# Patient Record
Sex: Female | Born: 1965 | Race: White | Hispanic: No | Marital: Single | Smoking: Current every day smoker
Health system: Southern US, Community
[De-identification: ages and names within clinical notes are randomized; demographics above are authoritative.]

## PROBLEM LIST (undated history)

## (undated) DIAGNOSIS — C801 Malignant (primary) neoplasm, unspecified: Secondary | ICD-10-CM

## (undated) DIAGNOSIS — B029 Zoster without complications: Secondary | ICD-10-CM

## (undated) DIAGNOSIS — R569 Unspecified convulsions: Secondary | ICD-10-CM

## (undated) DIAGNOSIS — G4730 Sleep apnea, unspecified: Secondary | ICD-10-CM

## (undated) DIAGNOSIS — I10 Essential (primary) hypertension: Secondary | ICD-10-CM

## (undated) DIAGNOSIS — G5600 Carpal tunnel syndrome, unspecified upper limb: Secondary | ICD-10-CM

## (undated) DIAGNOSIS — M8580 Other specified disorders of bone density and structure, unspecified site: Secondary | ICD-10-CM

## (undated) DIAGNOSIS — R011 Cardiac murmur, unspecified: Secondary | ICD-10-CM

## (undated) DIAGNOSIS — R51 Headache: Secondary | ICD-10-CM

## (undated) DIAGNOSIS — K2270 Barrett's esophagus without dysplasia: Secondary | ICD-10-CM

## (undated) DIAGNOSIS — R0789 Other chest pain: Secondary | ICD-10-CM

## (undated) DIAGNOSIS — E785 Hyperlipidemia, unspecified: Secondary | ICD-10-CM

## (undated) DIAGNOSIS — G479 Sleep disorder, unspecified: Principal | ICD-10-CM

## (undated) DIAGNOSIS — I499 Cardiac arrhythmia, unspecified: Secondary | ICD-10-CM

## (undated) DIAGNOSIS — K219 Gastro-esophageal reflux disease without esophagitis: Secondary | ICD-10-CM

## (undated) DIAGNOSIS — F419 Anxiety disorder, unspecified: Secondary | ICD-10-CM

## (undated) DIAGNOSIS — K449 Diaphragmatic hernia without obstruction or gangrene: Secondary | ICD-10-CM

## (undated) HISTORY — DX: Unspecified convulsions: R56.9

## (undated) HISTORY — PX: CYST REMOVAL TRUNK: SHX6283

## (undated) HISTORY — PX: OTHER SURGICAL HISTORY: SHX169

## (undated) HISTORY — DX: Hyperlipidemia, unspecified: E78.5

## (undated) HISTORY — DX: Barrett's esophagus without dysplasia: K22.70

## (undated) HISTORY — PX: NECK SURGERY: SHX720

## (undated) HISTORY — DX: Other chest pain: R07.89

## (undated) HISTORY — PX: BREAST BIOPSY: SHX20

## (undated) HISTORY — DX: Sleep disorder, unspecified: G47.9

## (undated) HISTORY — PX: EYE SURGERY: SHX253

## (undated) HISTORY — PX: ABDOMINAL HERNIA REPAIR: SHX539

## (undated) HISTORY — PX: CHOLECYSTECTOMY: SHX55

## (undated) HISTORY — PX: MYRINGOTOMY: SUR874

## (undated) HISTORY — PX: ADENOIDECTOMY: SUR15

## (undated) HISTORY — DX: Essential (primary) hypertension: I10

## (undated) HISTORY — DX: Headache: R51

## (undated) HISTORY — PX: TOTAL ABDOMINAL HYSTERECTOMY: SHX209

## (undated) HISTORY — DX: Diaphragmatic hernia without obstruction or gangrene: K44.9

## (undated) HISTORY — PX: SHOULDER SURGERY: SHX246

## (undated) HISTORY — PX: INNER EAR SURGERY: SHX679

## (undated) HISTORY — DX: Malignant (primary) neoplasm, unspecified: C80.1

## (undated) HISTORY — DX: Other specified disorders of bone density and structure, unspecified site: M85.80

---
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
## 2000-02-04 ENCOUNTER — Ambulatory Visit (HOSPITAL_COMMUNITY): Admission: RE | Admit: 2000-02-04 | Discharge: 2000-02-04 | Payer: Self-pay | Admitting: Otolaryngology

## 2000-02-04 ENCOUNTER — Encounter: Payer: Self-pay | Admitting: Otolaryngology

## 2000-05-15 ENCOUNTER — Ambulatory Visit (HOSPITAL_COMMUNITY): Admission: RE | Admit: 2000-05-15 | Discharge: 2000-05-15 | Payer: Self-pay | Admitting: Specialist

## 2000-05-15 ENCOUNTER — Encounter: Payer: Self-pay | Admitting: Specialist

## 2000-05-21 ENCOUNTER — Encounter: Payer: Self-pay | Admitting: Specialist

## 2000-05-21 ENCOUNTER — Ambulatory Visit (HOSPITAL_COMMUNITY): Admission: RE | Admit: 2000-05-21 | Discharge: 2000-05-21 | Payer: Self-pay | Admitting: Specialist

## 2002-01-07 ENCOUNTER — Other Ambulatory Visit: Admission: RE | Admit: 2002-01-07 | Discharge: 2002-01-07 | Payer: Self-pay | Admitting: Obstetrics and Gynecology

## 2004-11-09 ENCOUNTER — Other Ambulatory Visit: Admission: RE | Admit: 2004-11-09 | Discharge: 2004-11-09 | Payer: Self-pay | Admitting: Obstetrics and Gynecology

## 2005-11-28 ENCOUNTER — Encounter: Admission: RE | Admit: 2005-11-28 | Discharge: 2005-11-28 | Payer: Self-pay | Admitting: Obstetrics and Gynecology

## 2005-11-28 ENCOUNTER — Encounter (INDEPENDENT_AMBULATORY_CARE_PROVIDER_SITE_OTHER): Payer: Self-pay | Admitting: Specialist

## 2005-12-04 HISTORY — PX: BREAST BIOPSY: SHX20

## 2006-05-02 ENCOUNTER — Encounter: Admission: RE | Admit: 2006-05-02 | Discharge: 2006-05-02 | Payer: Self-pay | Admitting: Obstetrics and Gynecology

## 2007-05-06 ENCOUNTER — Encounter: Admission: RE | Admit: 2007-05-06 | Discharge: 2007-05-06 | Payer: Self-pay | Admitting: Obstetrics and Gynecology

## 2007-05-10 ENCOUNTER — Encounter: Admission: RE | Admit: 2007-05-10 | Discharge: 2007-05-10 | Payer: Self-pay | Admitting: Obstetrics and Gynecology

## 2007-06-20 HISTORY — PX: GALLBLADDER SURGERY: SHX652

## 2008-05-06 ENCOUNTER — Encounter: Admission: RE | Admit: 2008-05-06 | Discharge: 2008-05-06 | Payer: Self-pay | Admitting: Obstetrics and Gynecology

## 2009-05-07 ENCOUNTER — Encounter: Admission: RE | Admit: 2009-05-07 | Discharge: 2009-05-07 | Payer: Self-pay | Admitting: Obstetrics and Gynecology

## 2010-05-09 ENCOUNTER — Encounter: Admission: RE | Admit: 2010-05-09 | Discharge: 2010-05-09 | Payer: Self-pay | Admitting: Obstetrics and Gynecology

## 2010-07-10 ENCOUNTER — Encounter: Payer: Self-pay | Admitting: Obstetrics and Gynecology

## 2011-01-24 ENCOUNTER — Other Ambulatory Visit: Payer: Self-pay | Admitting: Obstetrics and Gynecology

## 2011-04-11 ENCOUNTER — Other Ambulatory Visit: Payer: Self-pay | Admitting: Obstetrics and Gynecology

## 2011-04-11 DIAGNOSIS — Z1231 Encounter for screening mammogram for malignant neoplasm of breast: Secondary | ICD-10-CM

## 2011-05-15 ENCOUNTER — Ambulatory Visit
Admission: RE | Admit: 2011-05-15 | Discharge: 2011-05-15 | Disposition: A | Discharge status: Home / Self Care | Payer: Medicare Other | Source: Ambulatory Visit | Attending: Obstetrics and Gynecology | Admitting: Obstetrics and Gynecology

## 2011-05-15 DIAGNOSIS — Z1231 Encounter for screening mammogram for malignant neoplasm of breast: Secondary | ICD-10-CM

## 2012-03-15 ENCOUNTER — Other Ambulatory Visit: Payer: Self-pay | Admitting: Obstetrics and Gynecology

## 2012-03-15 DIAGNOSIS — Z1231 Encounter for screening mammogram for malignant neoplasm of breast: Secondary | ICD-10-CM

## 2012-05-15 ENCOUNTER — Ambulatory Visit: Payer: Medicare Other

## 2012-05-15 ENCOUNTER — Ambulatory Visit
Admission: RE | Admit: 2012-05-15 | Discharge: 2012-05-15 | Disposition: A | Discharge status: Home / Self Care | Payer: Medicare Other | Source: Ambulatory Visit | Attending: Obstetrics and Gynecology | Admitting: Obstetrics and Gynecology

## 2012-05-15 DIAGNOSIS — Z1231 Encounter for screening mammogram for malignant neoplasm of breast: Secondary | ICD-10-CM

## 2012-09-05 ENCOUNTER — Telehealth: Payer: Self-pay | Admitting: *Deleted

## 2012-09-05 NOTE — Telephone Encounter (Signed)
I called the patient. The sleep study results are not available in the computer system. Dr. Frances Furbish ordered this study, and she likely will be reading the study. I will forward her a message to see if she has any information concerning this study.

## 2012-09-05 NOTE — Telephone Encounter (Signed)
Patient calling to get sleep study results.  

## 2012-09-10 ENCOUNTER — Telehealth: Payer: Self-pay | Admitting: *Deleted

## 2012-09-10 MED ORDER — SERTRALINE HCL 100 MG PO TABS: 100.0000 mg | ORAL_TABLET | Freq: Every day | ORAL | Status: DC

## 2012-09-10 NOTE — Telephone Encounter (Signed)
Patient called wanting to know what should she do about her Zoloft  because she down to one tablet and her sleep study hasn't been read.

## 2012-09-10 NOTE — Telephone Encounter (Signed)
I called patient. She is on 50 mg of Zoloft, and we will go up to the 100 mg dosing. I will call in a prescription. She has had a sleep study, but she has not yet heard the results of this study. She is to call our office again in one week if she has not gotten any results of the sleep study.

## 2012-09-11 ENCOUNTER — Telehealth: Payer: Self-pay | Admitting: Neurology

## 2012-09-11 NOTE — Telephone Encounter (Signed)
Please call pt to advise her that I read the sleep study and would like to go over the test results in detail with her. She has mild sleep apnea and we should get together to discuss treatment options. Pls set up FU appt in the next weeks for her and also route report to Dr. Anne Hahn, thx s

## 2012-09-13 ENCOUNTER — Encounter: Payer: Self-pay | Admitting: Neurology

## 2012-09-17 ENCOUNTER — Telehealth: Payer: Self-pay | Admitting: Neurology

## 2012-09-17 NOTE — Telephone Encounter (Signed)
I called the patient. The sleep study showed mild apnea changes, there may be no need for CPAP. I discussed this with the patient. The patient will be meeting with Dr. Frances Furbish in the near future.

## 2012-09-27 ENCOUNTER — Encounter: Payer: Self-pay | Admitting: Neurology

## 2012-09-27 ENCOUNTER — Ambulatory Visit (INDEPENDENT_AMBULATORY_CARE_PROVIDER_SITE_OTHER): Payer: Medicare PPO | Admitting: Neurology

## 2012-09-27 VITALS — BP 121/74 | HR 71 | Temp 98.1°F | Ht 62.0 in | Wt 143.0 lb

## 2012-09-27 DIAGNOSIS — R569 Unspecified convulsions: Secondary | ICD-10-CM | POA: Insufficient documentation

## 2012-09-27 DIAGNOSIS — G479 Sleep disorder, unspecified: Secondary | ICD-10-CM

## 2012-09-27 HISTORY — DX: Unspecified convulsions: R56.9

## 2012-09-27 HISTORY — DX: Sleep disorder, unspecified: G47.9

## 2012-09-27 NOTE — Progress Notes (Signed)
Subjective:    Patient ID: Crystal Lyons is a 47 y.o. female.  HPI  Interim history:  Crystal Lyons is a very pleasant 47 year old right-handed woman with an underlying history of seizures, hypertension, hyperlipidemia, headaches, reflux disease and chronic back pain who presents for followup consultation of her daytime somnolence and snoring. I had first met her on 08/20/2012 at which time I suggested a sleep study for further evaluation. She is followed by Dr. Anne Hahn for seizures. She continues to smoke regularly and we have talked about smoking cessation. She is accompanied by mother again today. She had a sleep study on 08/27/2012 I went over her test results with her and her mother today in detail. She had a reduced sleep efficiency at 61.9% with a long latency to sleep of 64.5 minutes. Of note, the patient was noted to drink caffeine units of upon arrival to the sleep lab and also admitted to  having had other caffeine beverages throughout the day. She had moderate sleep fragmentation. She had a markedly increased percentage of light stage sleep, near absence of deep sleep and a markedly reduced percentage of REM sleep at 4.4%. Mild intermittent snoring was noted. She had no clinically significant. Leg movements. EEG showed increased beta activity and sleep spindles. This could be a medication effect I explained. She had a total of one obstructive apnea and 22 obstructive  hypopneas for a total AHI of 6 per hour. She has no new complaints today. Her mother states that she has always been a very light sleeper. She reports no recent seizures. I explained to her that some of her sleep related issues may be a medication effect, others could be due to lifestyle or poor sleep hygiene. She has not been sick lately.    Her Past Medical History Is Significant For: Past Medical History  Diagnosis Date  . Hypertension   . Osteopenia   . Sleep disorder 09/27/2012  . Seizures 09/27/2012    Her Past  Surgical History Is Significant For: Past Surgical History  Procedure Laterality Date  . Brain surgery  01-06-85  . Shoulder surgery Bilateral 2009, 2010  . Gallbladder surgery N/A 2009  . Ears    . Neck surgery    . Eye surgery      cyst removed    Her Family History Is Significant For: Family History  Problem Relation Age of Onset  . Hypertension Father   . Cancer Father   . Cancer Mother   . Thyroid disease Mother   . Rheum arthritis Mother     Her Social History Is Significant For: History   Social History  . Marital Status: Single    Spouse Name: N/A    Number of Children: N/A  . Years of Education: N/A   Social History Main Topics  . Smoking status: Current Every Day Smoker -- 1.50 packs/day  . Smokeless tobacco: Current User  . Alcohol Use: No  . Drug Use: No  . Sexually Active: None   Other Topics Concern  . None   Social History Narrative  . None    Her Allergies Are:  Allergies  Allergen Reactions  . Adenosine Anaphylaxis    Heart block  . Aleve (Naproxen Sodium)   . Feldene (Piroxicam)   . Keflex (Cephalexin)   . Penicillins   :   Her Current Medications Are:  Outpatient Encounter Prescriptions as of 09/27/2012  Medication Sig Dispense Refill  . ALPRAZolam (XANAX) 0.5 MG tablet       .  HYDROcodone-acetaminophen (NORCO/VICODIN) 5-325 MG per tablet       . lansoprazole (PREVACID) 30 MG capsule       . losartan (COZAAR) 100 MG tablet       . NASONEX 50 MCG/ACT nasal spray       . sertraline (ZOLOFT) 100 MG tablet Take 1 tablet (100 mg total) by mouth daily.  30 tablet  3  . SPIRIVA HANDIHALER 18 MCG inhalation capsule       . VIVELLE-DOT 0.05 MG/24HR        No facility-administered encounter medications on file as of 09/27/2012.  : Review of Systems  Constitutional: Positive for chills, fatigue and unexpected weight change (gain).  HENT: Positive for trouble swallowing and tinnitus.   Cardiovascular: Positive for leg swelling.  Endocrine:  Positive for heat intolerance.       Flushing  Musculoskeletal: Positive for arthralgias.  Allergic/Immunologic: Positive for environmental allergies.  Neurological: Positive for dizziness and headaches.       Memory loss  Hematological: Bruises/bleeds easily.  Psychiatric/Behavioral: Positive for confusion and sleep disturbance (not enough sleep). The patient is nervous/anxious.     Objective:  Neurologic Exam  Physical Exam Physical Examination:   Filed Vitals:   09/27/12 0909  BP: 121/74  Pulse: 71  Temp: 98.1 F (36.7 C)    General Examination: The patient is a very pleasant 47 y.o. female in no acute distress.  HEENT exam: Normocephalic, status post brain surgery, pupils are equal, round and reactive to light. Extraocular tracking is good with normal smooth pursuit without nystagmus. Face is symmetric with normal facial animation. She has mild decrease in mobility in her neck. This unchanged and since her neck surgery. Hearing is intact. Speech is clear. Oropharynx exam reveals a Mallampati class II. She is a narrow airway with redundant soft palate but small tonsils and normal-sized tongue which protrudes centrally. Palate elevates symmetrically and tonsils are small. Mouth is significantly dry appearing. Chest is clear to auscultation without wheezing or rhonchi noted. Heart sounds are normal without murmurs, rubs or gallops noted. Abdomen is soft, nontender, with normal bowel sounds appreciated. Skin is dry appearing. No trophic changes are seen. Skin is warm and dry. No obvious rash is appreciated and no pitting edema in the distal lower extremities bilaterally. She has no obvious joint deformities. Neurologically: Mental status: The patient is awake, alert and oriented in all 4 spheres. Her memory, attention, language and knowledge are appropriate. Cranial nerves II through XII are as described above. Motor exam: Normal bulk, strength and tone is noted. She has no drift or  tremor. She has no rebound. Romberg is negative. Fine motor skills are intact. She has no truncal or gait ataxia.  reflexes are 2+ throughout. Sensory exam is intact to light touch, pinprick, vibration and temperature sense. Gait is slightly wide-based otherwise normal arm swing and cadence is noted.   Assessment and Plan:   In summary, Crystal Lyons is a 47 yo woman with a history of epilepsy who has recently been experiencing some apneic pauses while asleep. She is also suffering from daytime somnolence. I had a long chat with the patient and her mother regarding the recent sleep study findings. I explained to her that she has mild obstructive sleep apnea and I do not believe that her sleep apnea is the main reason for daytime somnolence. I think her sleep related issues are multifactorial in that she is on several sedating medications, she also has not been having a optimal  sleep hygiene and I reiterated that to her. We also again talked about smoking cessation as part of healthy lifestyle. She is advised that this time to try to lose weight and try to sleep on her sides. I did not suggest any medications or medication changes to her today but did encourage her followup routinely with our nurse practitioner for her epilepsy. Most of our visit was spent in reviewing test results, counseling and coordination of care. The patient was advised to followup with me on an as-needed basis. She and her mother were in agreement and did not have any questions prior to leaving clinic today.

## 2012-09-27 NOTE — Patient Instructions (Addendum)
You can follow up with me as needed. Please keep your appt with Dr. Anne Hahn

## 2012-11-23 DIAGNOSIS — M545 Low back pain, unspecified: Secondary | ICD-10-CM

## 2012-11-23 DIAGNOSIS — M5412 Radiculopathy, cervical region: Secondary | ICD-10-CM

## 2012-11-23 DIAGNOSIS — E7800 Pure hypercholesterolemia, unspecified: Secondary | ICD-10-CM

## 2012-11-23 HISTORY — DX: Low back pain, unspecified: M54.50

## 2012-11-23 HISTORY — DX: Pure hypercholesterolemia, unspecified: E78.00

## 2012-11-23 HISTORY — DX: Radiculopathy, cervical region: M54.12

## 2013-01-02 ENCOUNTER — Telehealth: Payer: Self-pay | Admitting: Neurology

## 2013-01-02 MED ORDER — SERTRALINE HCL 100 MG PO TABS: 100.0000 mg | ORAL_TABLET | Freq: Every day | ORAL | Status: DC

## 2013-01-02 NOTE — Telephone Encounter (Signed)
Pt called states pharmacy has been faxing over the request for sertraline (ZOLOFT) 100 MG tablet, but there has been no response from Dr. Anne Hahn per pt. Pt would like for someone to call her and let her know if she is going to get her refill. Thanks

## 2013-01-02 NOTE — Telephone Encounter (Signed)
We received the first request for this refill to day at 4:23pm, I had not gotten the opportunity to respond yet, as it has been less than 1 hour since the request was sent. I have sent the refill  I called the patient.  Advised her we have sent the Rx.  She is aware.

## 2013-02-20 ENCOUNTER — Encounter: Payer: Self-pay | Admitting: Nurse Practitioner

## 2013-02-20 ENCOUNTER — Ambulatory Visit (INDEPENDENT_AMBULATORY_CARE_PROVIDER_SITE_OTHER): Payer: Medicare PPO | Admitting: Nurse Practitioner

## 2013-02-20 VITALS — BP 128/74 | HR 67 | Ht 62.0 in | Wt 144.0 lb

## 2013-02-20 DIAGNOSIS — R569 Unspecified convulsions: Secondary | ICD-10-CM

## 2013-02-20 DIAGNOSIS — G479 Sleep disorder, unspecified: Secondary | ICD-10-CM

## 2013-02-20 MED ORDER — ALPRAZOLAM 0.5 MG PO TABS: 0.5000 mg | ORAL_TABLET | Freq: Three times a day (TID) | ORAL | Status: DC | PRN

## 2013-02-20 MED ORDER — SERTRALINE HCL 100 MG PO TABS: 100.0000 mg | ORAL_TABLET | Freq: Every day | ORAL | Status: DC

## 2013-02-20 NOTE — Progress Notes (Signed)
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 The patient underwent epilepsy surgery 1986, with partial resection of the right temporal lobe. The patient had improvement of seizures. Her usual seizures were associated with abdominal discomfort, then associated with a staring and confusional episode. The patient has not had these events since epilepsy surgery. The patient has not been on anticonvulsant medications. The patient is intolerant to Dilantin, Tegretol, phenobarbital, Mysoline, and Depakote. The patient currently has several episodes daily of periods of anxiety symptoms. The patient indicates that when she is busy, and her mind is occupied, she does not have these events. The patient also has periodic  headaches, and she takes [facility] powders for this, on average several  powders weekly . The patient indicates that the events of anxiety last a few minutes and then resolve. The patient does not lose consciousness or have confusion with this. The patient does operate a motor vehicle without problems. The patient does have chronic low back pain.  The patient most recent EEG study that showed right temporal sharp wave activity. Sleep study done in our office showed mild obstructive sleep apnea but Dr. Frances Furbish felt her sleep-related issues and daytime drowsiness are multifactorial in that she was also on several sedating medications with poor sleep hygiene.  ROS:  14 system review of symptoms is negative except for the following Chest pain, spinning sensation, feeling hot, joint pain, aching muscles, allergies, headache, anxiety, and restless legs   Medications Current Outpatient Prescriptions on File Prior to Visit  Medication Sig Dispense Refill  . ALPRAZolam (XANAX) 0.5 MG tablet       . HYDROcodone-acetaminophen (NORCO/VICODIN) 5-325 MG per tablet        . lansoprazole (PREVACID) 30 MG capsule       . losartan (COZAAR) 100 MG tablet       . NASONEX 50 MCG/ACT nasal spray       . sertraline (ZOLOFT) 100 MG tablet Take 1 tablet (100 mg total) by mouth daily.  30 tablet  3  . SPIRIVA HANDIHALER 18 MCG inhalation capsule       . VIVELLE-DOT 0.05 MG/24HR        No current facility-administered medications on file prior to visit.    Allergies  Allergies  Allergen Reactions  . Adenosine Anaphylaxis    Heart block  . Aleve [Naproxen Sodium]   . Feldene [Piroxicam]   . Keflex [Cephalexin]   . Penicillins     Physical Exam General: well developed, well nourished, seated, in no evident distress Head: head normocephalic and atraumatic. Status post brain surgery.Oropharynx benign Neck: supple with mild decreased mobility since neck surgery  Cardiovascular: regular rate and rhythm, no murmurs  Neurologic Exam Mental Status: Awake and fully alert. Oriented to place and time. Follows all commands. Speech and language normal.   Cranial Nerves: Pupils equal, briskly reactive to light. Extraocular movements full without nystagmus. Visual fields full to confrontation. Hearing intact and symmetric to finger snap. Facial sensation intact. Face, tongue, palate move normally and symmetrically. Neck flexion and extension normal.  Motor: Normal bulk and tone. Normal strength in all tested extremity muscles.No focal weakness Sensory.: intact to touch and pinprick and vibratory.  Coordination: Rapid alternating movements normal in all extremities. Finger-to-nose and heel-to-shin performed accurately bilaterally. Gait and Station: Arises from chair without difficulty. Stance is normal.  Able to heel, toe  and tandem walk without difficulty.  Reflexes: 2+ and symmetric. Toes downgoing.     ASSESSMENT: History of seizure disorder status post epilepsy surgery Anxiety disorder Excessive daytime drowsiness, and sleep study only shows mild obstructive sleep  apnea but Dr. Frances Furbish felt her sleep-related issues and daytime drowsiness are multifactorial in that she was also on several sedating medications with poor sleep hygiene.    PLAN: Patient will continue her Zoloft and Xanax as prescribed by Dr. Anne Hahn. She will followup in 6-8 months Crystal Lyons, [facility] APRN

## 2013-02-20 NOTE — Patient Instructions (Addendum)
Patient to continue Zoloft and alprazolam as ordered Followup in 6-8 months

## 2013-02-20 NOTE — Progress Notes (Signed)
I have read the note, and I agree with the clinical assessment and plan.  WILLIS,CHARLES KEITH   

## 2013-04-14 ENCOUNTER — Other Ambulatory Visit: Payer: Self-pay

## 2013-04-14 DIAGNOSIS — Z1231 Encounter for screening mammogram for malignant neoplasm of breast: Secondary | ICD-10-CM

## 2013-04-21 DIAGNOSIS — M5416 Radiculopathy, lumbar region: Secondary | ICD-10-CM

## 2013-04-21 DIAGNOSIS — M899 Disorder of bone, unspecified: Secondary | ICD-10-CM | POA: Insufficient documentation

## 2013-04-21 HISTORY — DX: Radiculopathy, lumbar region: M54.16

## 2013-04-21 HISTORY — DX: Disorder of bone, unspecified: M89.9

## 2013-04-22 ENCOUNTER — Other Ambulatory Visit: Payer: Self-pay | Admitting: Neurosurgery

## 2013-04-22 DIAGNOSIS — M899 Disorder of bone, unspecified: Secondary | ICD-10-CM

## 2013-05-19 ENCOUNTER — Ambulatory Visit: Payer: Medicare Other

## 2013-05-23 ENCOUNTER — Ambulatory Visit
Admission: RE | Admit: 2013-05-23 | Discharge: 2013-05-23 | Disposition: A | Discharge status: Home / Self Care | Payer: Medicare PPO | Source: Ambulatory Visit | Attending: Neurosurgery | Admitting: Neurosurgery

## 2013-05-23 ENCOUNTER — Other Ambulatory Visit: Payer: Self-pay | Admitting: Obstetrics and Gynecology

## 2013-05-23 ENCOUNTER — Ambulatory Visit
Admission: RE | Admit: 2013-05-23 | Discharge: 2013-05-23 | Disposition: A | Discharge status: Home / Self Care | Payer: Medicare PPO | Source: Ambulatory Visit

## 2013-05-23 DIAGNOSIS — N61 Mastitis without abscess: Secondary | ICD-10-CM

## 2013-05-23 DIAGNOSIS — M899 Disorder of bone, unspecified: Secondary | ICD-10-CM

## 2013-05-23 DIAGNOSIS — Z1231 Encounter for screening mammogram for malignant neoplasm of breast: Secondary | ICD-10-CM

## 2013-05-23 DIAGNOSIS — N644 Mastodynia: Secondary | ICD-10-CM

## 2013-06-04 ENCOUNTER — Ambulatory Visit
Admission: RE | Admit: 2013-06-04 | Discharge: 2013-06-04 | Disposition: A | Discharge status: Home / Self Care | Payer: Medicare PPO | Source: Ambulatory Visit | Attending: Obstetrics and Gynecology | Admitting: Obstetrics and Gynecology

## 2013-06-04 DIAGNOSIS — N644 Mastodynia: Secondary | ICD-10-CM

## 2013-06-04 DIAGNOSIS — N61 Mastitis without abscess: Secondary | ICD-10-CM

## 2013-06-24 ENCOUNTER — Other Ambulatory Visit: Payer: Self-pay

## 2013-08-19 DIAGNOSIS — G40909 Epilepsy, unspecified, not intractable, without status epilepticus: Secondary | ICD-10-CM

## 2013-08-19 DIAGNOSIS — M51379 Other intervertebral disc degeneration, lumbosacral region without mention of lumbar back pain or lower extremity pain: Secondary | ICD-10-CM

## 2013-08-19 DIAGNOSIS — M961 Postlaminectomy syndrome, not elsewhere classified: Secondary | ICD-10-CM

## 2013-08-19 DIAGNOSIS — F419 Anxiety disorder, unspecified: Secondary | ICD-10-CM

## 2013-08-19 DIAGNOSIS — I519 Heart disease, unspecified: Secondary | ICD-10-CM | POA: Insufficient documentation

## 2013-08-19 DIAGNOSIS — IMO0001 Reserved for inherently not codable concepts without codable children: Secondary | ICD-10-CM

## 2013-08-19 DIAGNOSIS — M5137 Other intervertebral disc degeneration, lumbosacral region: Secondary | ICD-10-CM

## 2013-08-19 HISTORY — DX: Reserved for inherently not codable concepts without codable children: IMO0001

## 2013-08-19 HISTORY — DX: Anxiety disorder, unspecified: F41.9

## 2013-08-19 HISTORY — DX: Other intervertebral disc degeneration, lumbosacral region: M51.37

## 2013-08-19 HISTORY — DX: Epilepsy, unspecified, not intractable, without status epilepticus: G40.909

## 2013-08-19 HISTORY — DX: Postlaminectomy syndrome, not elsewhere classified: M96.1

## 2013-08-19 HISTORY — DX: Other intervertebral disc degeneration, lumbosacral region without mention of lumbar back pain or lower extremity pain: M51.379

## 2013-08-20 ENCOUNTER — Other Ambulatory Visit: Payer: Self-pay

## 2013-08-20 MED ORDER — ALPRAZOLAM 0.5 MG PO TABS: 0.5000 mg | ORAL_TABLET | Freq: Three times a day (TID) | ORAL | Status: DC | PRN

## 2013-08-20 NOTE — Telephone Encounter (Signed)
Rx signed and faxed.

## 2013-08-27 ENCOUNTER — Encounter: Payer: Self-pay | Admitting: Nurse Practitioner

## 2013-08-27 ENCOUNTER — Ambulatory Visit (INDEPENDENT_AMBULATORY_CARE_PROVIDER_SITE_OTHER): Payer: Medicare PPO | Admitting: Nurse Practitioner

## 2013-08-27 ENCOUNTER — Ambulatory Visit: Payer: Medicare PPO | Admitting: Nurse Practitioner

## 2013-08-27 VITALS — BP 113/71 | HR 73 | Ht 62.0 in | Wt 141.0 lb

## 2013-08-27 DIAGNOSIS — R569 Unspecified convulsions: Secondary | ICD-10-CM

## 2013-08-27 NOTE — Patient Instructions (Signed)
Plenty of fluids to prevent dehydration, skin and mucous membranes are dry Continue  Xanax at current dose does not need refills Continue Zoloft for current dose does not need refills

## 2013-08-27 NOTE — Progress Notes (Signed)
I have read the note, and I agree with the clinical assessment and plan.  Harveer Sadler KEITH   

## 2013-08-27 NOTE — Progress Notes (Signed)
[facility]  PATIENT: Crystal Lyons DOB: Dec 11, 1965   REASON FOR VISIT: Followup history of intractable epilepsy    HISTORY OF PRESENT ILLNESS: Ms. Crystal Lyons, 48 year old female returns for followup. She has a history of partial resection of the right temporal lobe  in 1986 for her intractable seizure disorder. She has not had seizure since that time and has not been on anticonvulsant medications. In the past she had intolerance to Dilantin, Tegretol, Depakote, phenobarbital, and Mysoline. She continues to have episodes of anxiety symptoms if she keeps herself busy she did not have these events.  She is currently on Xanax 3 times a day and just had that refilled she is also on Zoloft through this office. She returns for reevaluation.    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 The patient underwent epilepsy surgery 1986, with partial resection of the right temporal lobe. The patient had improvement of seizures. Her usual seizures were associated with abdominal discomfort, then associated with a staring and confusional episode. The patient has not had these events since epilepsy surgery. The patient has not been on anticonvulsant medications. The patient is intolerant to Dilantin, Tegretol, phenobarbital, Mysoline, and Depakote. The patient currently has several episodes daily of periods of anxiety symptoms. The patient indicates that when she is busy, and her mind is occupied, she does not have these events. The patient also has periodic headaches, and she takes [facility] powders for this, on average several powders weekly . The patient indicates that the events of anxiety last a few minutes and then resolve. The patient does not lose consciousness or have confusion with this. The patient does operate a motor vehicle without problems. The patient does have chronic low back pain. The patient most recent EEG study that showed right temporal sharp wave activity. Sleep study done  in our office showed mild obstructive sleep apnea but Dr. Rexene Alberts felt her sleep-related issues and daytime drowsiness are multifactorial in that she was also on several sedating medications with poor sleep hygiene.   REVIEW OF SYSTEMS: Full 14 system review of systems performed and notable only for those listed, all others are neg:  Constitutional: N/A  Cardiovascular: N/A  Ear/Nose/Throat: N/A  Skin: N/A  Eyes: N/A  Respiratory: Shortness of breath Gastroitestinal: N/A  Hematology/Lymphatic: N/A  Endocrine: Excessive thirst Musculoskeletal:N/A  Allergy/Immunology: N/A  Neurological: Occasional headache Psychiatric: N/A   ALLERGIES: Allergies  Allergen Reactions  . Adenosine Anaphylaxis    Heart block  . Aleve [Naproxen Sodium]   . Feldene [Piroxicam]   . Keflex [Cephalexin]   . Penicillins     HOME MEDICATIONS: Outpatient Prescriptions Prior to Visit  Medication Sig Dispense Refill  . ALPRAZolam (XANAX) 0.5 MG tablet Take 1 tablet (0.5 mg total) by mouth 3 (three) times daily as needed.  90 tablet  5  . estradiol (ESTRACE VAGINAL) 0.1 MG/GM vaginal cream Place 2 g vaginally. Bid weekly      . HYDROcodone-acetaminophen (NORCO/VICODIN) 5-325 MG per tablet       . hyoscyamine (ANASPAZ) 0.125 MG TBDP tablet Place 0.125 mg under the tongue every 4 (four) hours as needed.      . lansoprazole (PREVACID) 30 MG capsule Take 30 mg by mouth daily.       Marland Kitchen losartan (COZAAR) 100 MG tablet Take 100 mg by mouth daily.       Marland Kitchen NASONEX 50 MCG/ACT nasal spray       . sertraline (ZOLOFT) 100 MG tablet Take 1 tablet (  100 mg total) by mouth daily.  30 tablet  6  . simvastatin (ZOCOR) 20 MG tablet       . SPIRIVA HANDIHALER 18 MCG inhalation capsule Place 18 mcg into inhaler and inhale daily.       Marland Kitchen tiZANidine (ZANAFLEX) 4 MG tablet Take 4 mg by mouth as needed.       Marland Kitchen VIVELLE-DOT 0.05 MG/24HR        No facility-administered medications prior to visit.    PAST MEDICAL HISTORY: Past  Medical History  Diagnosis Date  . Hypertension   . Osteopenia   . Sleep disorder 09/27/2012  . Seizures 09/27/2012    PAST SURGICAL HISTORY: Past Surgical History  Procedure Laterality Date  . Brain surgery  01-06-85  . Shoulder surgery Bilateral 2009, 2010  . Gallbladder surgery N/A 2009  . Ears    . Neck surgery    . Eye surgery      cyst removed    FAMILY HISTORY: Family History  Problem Relation Age of Onset  . Hypertension Father   . Cancer Father   . Cancer Mother   . Thyroid disease Mother   . Rheum arthritis Mother     SOCIAL HISTORY: History   Social History  . Marital Status: Single    Spouse Name: N/A    Number of Children: 0  . Years of Education: 8   Occupational History  . Disabled    Social History Main Topics  . Smoking status: Current Every Day Smoker -- 1.50 packs/day    Types: Cigarettes  . Smokeless tobacco: Former Neurosurgeon    Types: Chew  . Alcohol Use: No  . Drug Use: No  . Sexual Activity: Not on file   Other Topics Concern  . Not on file   Social History Narrative   Patient lives at home mother    Patient has no children.    Patient is single.    Patient has a 8th grade education.    Patient is disabled           PHYSICAL EXAM  Filed Vitals:   08/27/13 1120  BP: 113/71  Pulse: 73  Height:  (1.575 m)  Weight: 141 lb (63.957 kg)   Body mass index is 25.78 kg/(m^2).  Generalized: Well developed, in no acute distress  Head: normocephalic and atraumatic,. Oropharynx mucous membranes dry and mildly cracked  Neck: Supple, no carotid bruits  Cardiac: Regular rate rhythm, no murmur  Skin dry and rough in texture on hands and forearms  Neurological examination   Mentation: Alert oriented to time, place, history taking. Follows all commands speech and language fluent  Cranial nerve II-XII:.Pupils were equal round reactive to light extraocular movements were full, visual field were full on confrontational test. Facial  sensation and strength were normal. hearing was intact to finger rubbing bilaterally. Uvula tongue midline. head turning and shoulder shrug were normal and symmetric.Tongue protrusion into cheek strength was normal. Motor: normal bulk and tone, full strength in the BUE, BLE,  No focal weakness Coordination: finger-nose-finger, heel-to-shin bilaterally, no dysmetria Reflexes: Brachioradialis 2/2, biceps 2/2, triceps 2/2, patellar 2/2, Achilles 2/2, plantar responses were flexor bilaterally. Gait and Station: Rising up from seated position without assistance, normal stance,  moderate stride, good arm swing, smooth turning, able to perform tiptoe, and heel walking without difficulty. Tandem gait is steady  DIAGNOSTIC DATA (LABS, IMAGING, TESTING)  ASSESSMENT AND PLAN  48 y.o. year old female  has a past medical  history of seizure disorder status post epilepsy surgery now with anxiety disorder  Plenty of fluids to prevent dehydration, skin and mucous membranes are dry Continue  Xanax at current dose does not need refills Continue Zoloft for current dose does not need refills Dennie Bible, [facility], [facility], APRN  [facility] [address] [place], [place] [address] [phone]

## 2013-10-29 ENCOUNTER — Other Ambulatory Visit: Payer: Self-pay

## 2013-10-29 MED ORDER — SERTRALINE HCL 100 MG PO TABS: 100.0000 mg | ORAL_TABLET | Freq: Every day | ORAL | Status: DC

## 2014-01-16 ENCOUNTER — Encounter: Payer: Self-pay | Admitting: Neurology

## 2014-01-16 ENCOUNTER — Telehealth: Payer: Self-pay | Admitting: Neurology

## 2014-01-16 DIAGNOSIS — R51 Headache: Secondary | ICD-10-CM

## 2014-01-16 NOTE — Telephone Encounter (Signed)
Patient requesting a call from Dr. Jannifer Franklin to discuss the knots on her head and about getting an MRI, please return call to patient and advise.

## 2014-01-19 NOTE — Telephone Encounter (Signed)
Patient is concerned about knots/pockets on her forehead close to an incision. She has shown this to [facility] before and there was no concern. She is requesting MRI because she has never had one. She would like a call from you. Please call her cell number.

## 2014-01-19 NOTE — Telephone Encounter (Signed)
I called patient. The patient has had some burning around her incision site of her epilepsy surgery. The actual surgery was done in 1986, but the patient just started having problems within the last year. I'll get a quick CT scan of the head to investigate.

## 2014-01-23 ENCOUNTER — Other Ambulatory Visit: Payer: Medicare PPO

## 2014-01-26 ENCOUNTER — Ambulatory Visit
Admission: RE | Admit: 2014-01-26 | Discharge: 2014-01-26 | Disposition: A | Discharge status: Home / Self Care | Payer: Medicare PPO | Source: Ambulatory Visit | Attending: Neurology | Admitting: Neurology

## 2014-01-26 DIAGNOSIS — R51 Headache: Secondary | ICD-10-CM

## 2014-01-28 ENCOUNTER — Telehealth: Payer: Self-pay | Admitting: Neurology

## 2014-01-28 NOTE — Telephone Encounter (Signed)
I reviewed the head CT report as well as the images. There does not seem to be any new finding. She certainly does have postsurgical changes in the right temporal lobe area, most likely from her previous epilepsy surgery. There were no acute findings. Unfortunately, there is no recent head CT for comparison on file and that I could see.

## 2014-01-28 NOTE — Telephone Encounter (Signed)
Patient aware of CT scan no abnormal findings.

## 2014-01-28 NOTE — Telephone Encounter (Signed)
Patient calling to request CT scan results.

## 2014-01-28 NOTE — Telephone Encounter (Signed)
Dr. Rexene Alberts can you please review finding of patients CT and advise." [facility] patient" Thanks.

## 2014-02-03 ENCOUNTER — Telehealth: Payer: Self-pay | Admitting: Neurology

## 2014-02-03 NOTE — Telephone Encounter (Signed)
  I called patient. CT the head is relatively unremarkable. The patient reports fluid under the skin on the frontal areas, but this is not apparent on the CT. The patient has burning sensations around the site of surgery have been present a year, worse more recently. She also reports a two-year history of daily headaches, and she takes 3-4 BC powders daily, and drinks a lot of caffeinated products during the day. The patient takes hydrocodone daily for her neck and back pain. The patient takes baclofen on occasion for the headaches, but the 10 mg tablet makes her too drowsy. I will get a revisit for the patient, we will consider a taper off of the caffeine, and potential he getting her on low-dose gabapentin for the headache and for the burning sensations on the head.  CT head 01/26/2014:  Impression   Abnormal CT head (without) demonstrating: 1. Right anterior temporal lobectomy. 2. No acute findings.

## 2014-02-03 NOTE — Telephone Encounter (Signed)
Stephanie Dr. Jannifer Franklin wants Hoyle Sauer to see this patient within the next week or so. I do not see availability with next 3 weeks. Please keep checking.

## 2014-02-06 NOTE — Telephone Encounter (Signed)
Crystal Lyons called patient to schedule appointment with CM. Patient does not want to see CM because she says there is nothing wrong with her. Patient declined appointment with Hoyle Sauer. Says she needs to see you.

## 2014-02-06 NOTE — Telephone Encounter (Signed)
 -----   Message from Kathrynn Ducking, MD sent at 02/03/2014 12:20 PM -----     Please have a revisit set up for this patient sometime within the next month with Hoyle Sauer. Thank you.

## 2014-02-06 NOTE — Telephone Encounter (Signed)
Spoke to the patient and was going to offer her an appt for this Monday. Patient wants Dr. Jannifer Franklin assistant to call her because she wants to see Willis. Please advise.

## 2014-02-06 NOTE — Telephone Encounter (Signed)
The patient apparently has called to try to get a revisit set up, okay to set one with me. May worker in sometime within the next 3 or 4 weeks.

## 2014-02-20 ENCOUNTER — Other Ambulatory Visit: Payer: Self-pay

## 2014-02-20 MED ORDER — ALPRAZOLAM 0.5 MG PO TABS: 0.5000 mg | ORAL_TABLET | Freq: Three times a day (TID) | ORAL | Status: DC | PRN

## 2014-02-20 NOTE — Telephone Encounter (Signed)
Rx signed and faxed.

## 2014-02-24 DIAGNOSIS — G40909 Epilepsy, unspecified, not intractable, without status epilepticus: Secondary | ICD-10-CM | POA: Insufficient documentation

## 2014-02-24 DIAGNOSIS — E559 Vitamin D deficiency, unspecified: Secondary | ICD-10-CM | POA: Insufficient documentation

## 2014-02-24 DIAGNOSIS — R0789 Other chest pain: Secondary | ICD-10-CM | POA: Insufficient documentation

## 2014-02-24 HISTORY — DX: Vitamin D deficiency, unspecified: E55.9

## 2014-02-24 HISTORY — DX: Epilepsy, unspecified, not intractable, without status epilepticus: G40.909

## 2014-03-09 ENCOUNTER — Encounter (INDEPENDENT_AMBULATORY_CARE_PROVIDER_SITE_OTHER): Payer: Self-pay

## 2014-03-09 ENCOUNTER — Encounter: Payer: Self-pay | Admitting: Neurology

## 2014-03-09 ENCOUNTER — Ambulatory Visit (INDEPENDENT_AMBULATORY_CARE_PROVIDER_SITE_OTHER): Payer: Medicare PPO | Admitting: Neurology

## 2014-03-09 VITALS — BP 117/74 | HR 71 | Wt 144.0 lb

## 2014-03-09 DIAGNOSIS — R5383 Other fatigue: Secondary | ICD-10-CM

## 2014-03-09 DIAGNOSIS — R569 Unspecified convulsions: Secondary | ICD-10-CM

## 2014-03-09 DIAGNOSIS — R51 Headache: Secondary | ICD-10-CM

## 2014-03-09 DIAGNOSIS — R519 Headache, unspecified: Secondary | ICD-10-CM

## 2014-03-09 DIAGNOSIS — R5381 Other malaise: Secondary | ICD-10-CM

## 2014-03-09 HISTORY — DX: Headache, unspecified: R51.9

## 2014-03-09 HISTORY — DX: Headache: R51

## 2014-03-09 NOTE — Progress Notes (Signed)
Reason for visit: Headache  Crystal Lyons is an 48 y.o. female  History of present illness:  Crystal Lyons is a 48 year old right-handed white female with a history of intractable epilepsy, status post a right temporal lobectomy for the seizures with good improvement. The patient is not on seizure medications, but she does take alprazolam if needed for anxiety. The patient has had chronic daily headaches throughout the head for number of years, but she has also had some burning sensations around the incision area on the right temporal area. The patient indicates that this burning sensation has worsened recently. The patient is feeling as if there is a swelling in the forehead. The patient recently underwent a CT scan of the head that was unremarkable. The patient has been reporting increased fatigue over the last several months, and some worsening of memory and concentration. She goes on to say that she takes 3 or 4 Goody Powders a day and she drinks about 40 ounces of Pepsi a day, and 2 cups of iced tea. The patient comes to this office for an evaluation.   Past Medical History  Diagnosis Date  . Hypertension   . Osteopenia   . Sleep disorder 09/27/2012  . Seizures 09/27/2012  . IRCVELFY(101.7) 03/09/2014    Past Surgical History  Procedure Laterality Date  . Brain surgery  01-06-85  . Shoulder surgery Bilateral 2009, 2010  . Gallbladder surgery N/A 2009  . Ears    . Neck surgery    . Eye surgery      cyst removed    Family History  Problem Relation Age of Onset  . Hypertension Father   . Cancer Father   . Cancer Mother   . Thyroid disease Mother   . Rheum arthritis Mother   . Cirrhosis Sister     Social history:  reports that she has been smoking Cigarettes.  She has been smoking about 1.50 packs per day. She has quit using smokeless tobacco. Her smokeless tobacco use included Chew. She reports that she does not drink alcohol or use illicit drugs.    Allergies  Allergen  Reactions  . Adenosine Anaphylaxis    Heart block  . Aleve [Naproxen Sodium]   . Feldene [Piroxicam]   . Keflex [Cephalexin]   . Penicillins     Medications:  Current Outpatient Prescriptions on File Prior to Visit  Medication Sig Dispense Refill  . ALPRAZolam (XANAX) 0.5 MG tablet Take 1 tablet (0.5 mg total) by mouth 3 (three) times daily as needed.  90 tablet  5  . baclofen (LIORESAL) 10 MG tablet 10 mg as needed.      . Calcium Carbonate (CALCIUM 600 PO) Take 600 mg by mouth.      . Cholecalciferol (VITAMIN D3) 5000 UNITS CAPS 5,000 Units daily.      Marland Kitchen estradiol (ESTRACE VAGINAL) 0.1 MG/GM vaginal cream Place 2 g vaginally. Bid weekly      . HYDROcodone-acetaminophen (NORCO/VICODIN) 5-325 MG per tablet       . hyoscyamine (ANASPAZ) 0.125 MG TBDP tablet Place 0.125 mg under the tongue every 4 (four) hours as needed.      . lansoprazole (PREVACID) 30 MG capsule Take 30 mg by mouth daily.       Marland Kitchen losartan (COZAAR) 100 MG tablet Take 100 mg by mouth daily.       Marland Kitchen NASONEX 50 MCG/ACT nasal spray       . sertraline (ZOLOFT) 100 MG tablet Take 1 tablet (  100 mg total) by mouth daily.  30 tablet  6  . simvastatin (ZOCOR) 20 MG tablet       . SPIRIVA HANDIHALER 18 MCG inhalation capsule Place 18 mcg into inhaler and inhale daily.       Marland Kitchen tiZANidine (ZANAFLEX) 4 MG tablet Take 4 mg by mouth as needed.       Marland Kitchen VIVELLE-DOT 0.05 MG/24HR        No current facility-administered medications on file prior to visit.    ROS:  Out of a complete 14 system review of symptoms, the patient complains only of the following symptoms, and all other reviewed systems are negative.  Chills, fatigue Ear pain, ringing in the ears, runny nose, difficulty swallowing Blurring of vision, eye discomfort Cough, wheezing, shortness of breath, chest tightness Chest pain, leg swelling, heart murmur Sleep apnea, snoring, sleep talking Back pain, achy muscles, muscle cramps Itching Bruising easily Dizziness,  headache, numbness, seizures, weakness Anxiety  Blood pressure 117/74, pulse 71, weight 144 lb (65.318 kg).  Physical Exam  General: The patient is alert and cooperative at the time of the examination.  Skin: No significant peripheral edema is noted.   Neurologic Exam  Mental status: The patient is oriented x 3.  Cranial nerves: Facial symmetry is present. Speech is normal, no aphasia or dysarthria is noted. Extraocular movements are full. Visual fields are full. Pupils are equal, round, and reactive to light. Discs are flat bilaterally.  Motor: The patient has good strength in all 4 extremities.  Sensory examination: Soft touch sensation is symmetric on the face, arms, and legs.  Coordination: The patient has good finger-nose-finger and heel-to-shin bilaterally.  Gait and station: The patient has a normal gait. Tandem gait is normal. Romberg is negative. No drift is seen.  Reflexes: Deep tendon reflexes are symmetric.   CT head 01/27/14:  IMPRESSION:  Abnormal CT head (without) demonstrating: 1. Right anterior temporal lobectomy.  2. No acute findings.    Assessment/Plan:  1. History of seizures, status post epilepsy surgery  2. Chronic daily headaches  The clinical examination is unremarkable. The patient does report some increased burning sensations around the incision area on the right temporal region. The patient reports swelling on the right forehead, but this is not observed on the clinical examination, and a CT scan of the brain shows stable findings. The patient is having increased fatigue, and for this reason, blood work will be done. She will followup in about 4 or 5 months. The patient is using caffeinated products frequently throughout the day, and she will need to reduce her caffeine intake during the day to help improve her headache issues.  Jill Alexanders MD 03/09/2014 2:35 PM  [facility] [address] [place], [place]  [address]  Phone [phone] Fax [phone]

## 2014-03-09 NOTE — Patient Instructions (Signed)

## 2014-03-10 LAB — CBC WITH DIFFERENTIAL
BASOS ABS: 0 10*3/uL (ref 0.0–0.2)
Basos: 0 %
EOS: 3 %
Eosinophils Absolute: 0.2 10*3/uL (ref 0.0–0.4)
HEMATOCRIT: 39.3 % (ref 34.0–46.6)
Hemoglobin: 13.8 g/dL (ref 11.1–15.9)
IMMATURE GRANULOCYTES: 0 %
Immature Grans (Abs): 0 10*3/uL (ref 0.0–0.1)
LYMPHS: 31 %
Lymphocytes Absolute: 2.6 10*3/uL (ref 0.7–3.1)
MCH: 33.2 pg — ABNORMAL HIGH (ref 26.6–33.0)
MCHC: 35.1 g/dL (ref 31.5–35.7)
MCV: 95 fL (ref 79–97)
Monocytes Absolute: 0.3 10*3/uL (ref 0.1–0.9)
Monocytes: 4 %
Neutrophils Absolute: 5.2 10*3/uL (ref 1.4–7.0)
Neutrophils Relative %: 62 %
Platelets: 346 10*3/uL (ref 150–379)
RBC: 4.16 x10E6/uL (ref 3.77–5.28)
RDW: 13.9 % (ref 12.3–15.4)
WBC: 8.4 10*3/uL (ref 3.4–10.8)

## 2014-03-10 LAB — COMPREHENSIVE METABOLIC PANEL
ALT: 12 IU/L (ref 0–32)
AST: 16 IU/L (ref 0–40)
Albumin/Globulin Ratio: 1.9 (ref 1.1–2.5)
Albumin: 4.3 g/dL (ref 3.5–5.5)
Alkaline Phosphatase: 70 IU/L (ref 39–117)
BILIRUBIN TOTAL: 0.3 mg/dL (ref 0.0–1.2)
BUN/Creatinine Ratio: 8 — ABNORMAL LOW (ref 9–23)
BUN: 6 mg/dL (ref 6–24)
CALCIUM: 9.7 mg/dL (ref 8.7–10.2)
CHLORIDE: 102 mmol/L (ref 97–108)
CO2: 23 mmol/L (ref 18–29)
Creatinine, Ser: 0.76 mg/dL (ref 0.57–1.00)
GFR calc non Af Amer: 94 mL/min/{1.73_m2} (ref >59)
GFR, EST AFRICAN AMERICAN: 109 mL/min/{1.73_m2} (ref >59)
GLUCOSE: 81 mg/dL (ref 65–99)
Globulin, Total: 2.3 g/dL (ref 1.5–4.5)
POTASSIUM: 4.5 mmol/L (ref 3.5–5.2)
Sodium: 144 mmol/L (ref 134–144)
Total Protein: 6.6 g/dL (ref 6.0–8.5)

## 2014-03-10 LAB — TSH: TSH: 0.73 u[IU]/mL (ref 0.450–4.500)

## 2014-03-10 LAB — VITAMIN B12: VITAMIN B 12: 357 pg/mL (ref 211–946)

## 2014-03-25 ENCOUNTER — Other Ambulatory Visit: Payer: Self-pay | Admitting: Obstetrics and Gynecology

## 2014-03-26 LAB — CYTOLOGY - PAP

## 2014-04-03 ENCOUNTER — Other Ambulatory Visit: Payer: Self-pay

## 2014-04-27 ENCOUNTER — Other Ambulatory Visit: Payer: Self-pay

## 2014-04-27 DIAGNOSIS — Z1231 Encounter for screening mammogram for malignant neoplasm of breast: Secondary | ICD-10-CM

## 2014-06-05 ENCOUNTER — Ambulatory Visit
Admission: RE | Admit: 2014-06-05 | Discharge: 2014-06-05 | Disposition: A | Discharge status: Home / Self Care | Payer: Medicare HMO | Source: Ambulatory Visit

## 2014-06-05 DIAGNOSIS — Z1231 Encounter for screening mammogram for malignant neoplasm of breast: Secondary | ICD-10-CM

## 2014-06-23 ENCOUNTER — Other Ambulatory Visit: Payer: Self-pay

## 2014-06-23 MED ORDER — SERTRALINE HCL 100 MG PO TABS: 100.0000 mg | ORAL_TABLET | Freq: Every day | ORAL | Status: DC

## 2014-07-09 ENCOUNTER — Ambulatory Visit (INDEPENDENT_AMBULATORY_CARE_PROVIDER_SITE_OTHER): Payer: Medicare PPO | Admitting: Adult Health

## 2014-07-09 ENCOUNTER — Encounter: Payer: Self-pay | Admitting: Adult Health

## 2014-07-09 VITALS — BP 134/86 | HR 69 | Ht 61.0 in | Wt 147.0 lb

## 2014-07-09 DIAGNOSIS — R569 Unspecified convulsions: Secondary | ICD-10-CM

## 2014-07-09 DIAGNOSIS — R5383 Other fatigue: Secondary | ICD-10-CM

## 2014-07-09 DIAGNOSIS — G4719 Other hypersomnia: Secondary | ICD-10-CM

## 2014-07-09 NOTE — Progress Notes (Signed)
PATIENT: Crystal Lyons DOB: 1966-02-25  REASON FOR VISIT: follow up-seizures HISTORY FROM: patient  HISTORY OF PRESENT ILLNESS: Crystal Lyons is a 49 year old female with a history of intractable epilepsy. The patient did have a right temporal lobectomy for the seizures. She is no longer on seizure medication. She states that she has not had a seizure since the surgery. She states that she continues to have the warning signs. When she had the seizures in the past she would grab at her stomach- that would last for 1-2 minutes then she would come out of it and be really sleepy. Her first seizure ever was a grand mal. The patient is not on seizure medication but she is on zoloft and xanax to help with the anxiety. She states if she stays calm are seizures are controlled. She has not been on seizure medication since 1987. She has tried tegretol in the past but could not tolerate the medication.  At the last visit the patient was complaining of some burning sensations around the incision area as well as swelling. She continues to have this but it is intermittent. She also reported some issues with her memory and concentration. Blood work was done that was unremarkable. She states that her memory and concentration have remained the same. She has not cut back on her caffeine intake. The patient does complain of daytime sleepiness and fatigue. In the past she has had a sleep study that showed mild apnea. The patient's Epworth score 14 and fatigue severity score is 46. She states that she goes to bed between 12 AM to 1:30 AM and will arrive at 9:30 AM. She does watch TV or play on her phone before bedtime. She also consumes caffeine in the evenings. Patient does confirm that she snores.   HISTORY 03/09/14 (WILLIS): Crystal Lyons is a 49 year old right-handed white female with a history of intractable epilepsy, status post a right temporal lobectomy for the seizures with good improvement. The patient is not on  seizure medications, but she does take alprazolam if needed for anxiety. The patient has had chronic daily headaches throughout the head for number of years, but she has also had some burning sensations around the incision area on the right temporal area. The patient indicates that this burning sensation has worsened recently. The patient is feeling as if there is a swelling in the forehead. The patient recently underwent a CT scan of the head that was unremarkable. The patient has been reporting increased fatigue over the last several months, and some worsening of memory and concentration. She goes on to say that she takes 3 or 4 Goody Powders a day and she drinks about 40 ounces of Pepsi a day, and 2 cups of iced tea. The patient comes to this office for an evaluation.    REVIEW OF SYSTEMS: Out of a complete 14 system review of symptoms, the patient complains only of the following symptoms, and all other reviewed systems are negative.  Chills T, unexpected weight change, ear pain, ringing in ears, runny nose, trouble swallowing, eye redness, light sensitivity, wheezing, shortness of breath, chest pain, leg swelling, murmur, apnea, daytime sleepiness, snoring, sleep talking, back pain, aching muscles, neck pain, neck stiffness, wounds, moles, dizziness, headache, seizure, weakness  ALLERGIES: Allergies  Allergen Reactions  . Adenosine Anaphylaxis    Heart block  . Aleve [Naproxen Sodium]   . Feldene [Piroxicam]   . Keflex [Cephalexin]   . Penicillins     HOME MEDICATIONS: Outpatient  Prescriptions Prior to Visit  Medication Sig Dispense Refill  . ALPRAZolam (XANAX) 0.5 MG tablet Take 1 tablet (0.5 mg total) by mouth 3 (three) times daily as needed. 90 tablet 5  . azelastine (ASTELIN) 0.1 % nasal spray Place 1 spray into both nostrils 2 (two) times daily.     . baclofen (LIORESAL) 10 MG tablet 10 mg as needed.    . Calcium Carbonate (CALCIUM 600 PO) Take 600 mg by mouth.    . Cholecalciferol  (VITAMIN D3) 5000 UNITS CAPS 5,000 Units daily.    Marland Kitchen estradiol (ESTRACE VAGINAL) 0.1 MG/GM vaginal cream Place 2 g vaginally. Bid weekly    . fexofenadine-pseudoephedrine (ALLEGRA-D 24) 180-240 MG per 24 hr tablet Take 1 tablet by mouth daily.    Marland Kitchen HYDROcodone-acetaminophen (NORCO/VICODIN) 5-325 MG per tablet     . hyoscyamine (ANASPAZ) 0.125 MG TBDP tablet Place 0.125 mg under the tongue every 4 (four) hours as needed.    . lansoprazole (PREVACID) 30 MG capsule Take 30 mg by mouth daily.     Marland Kitchen losartan (COZAAR) 100 MG tablet Take 100 mg by mouth daily.     Marland Kitchen NASONEX 50 MCG/ACT nasal spray     . Salicylic Acid 40 % PADS Apply 1 application topically as needed.    . sertraline (ZOLOFT) 100 MG tablet Take 1 tablet (100 mg total) by mouth daily. 30 tablet 6  . SPIRIVA HANDIHALER 18 MCG inhalation capsule Place 18 mcg into inhaler and inhale daily.     Marland Kitchen tiZANidine (ZANAFLEX) 4 MG tablet Take 4 mg by mouth as needed.     Marland Kitchen VIVELLE-DOT 0.05 MG/24HR     . ZINC OXIDE, TOPICAL, 10 % CREA Apply 1 application topically as needed.    . simvastatin (ZOCOR) 20 MG tablet      No facility-administered medications prior to visit.    PAST MEDICAL HISTORY: Past Medical History  Diagnosis Date  . Hypertension   . Osteopenia   . Sleep disorder 09/27/2012  . Seizures 09/27/2012  . Headache(784.0) 03/09/2014    PAST SURGICAL HISTORY: Past Surgical History  Procedure Laterality Date  . Brain surgery  01-06-85  . Shoulder surgery Bilateral 2009, 2010  . Gallbladder surgery N/A 2009  . Ears    . Neck surgery    . Eye surgery      cyst removed    FAMILY HISTORY: Family History  Problem Relation Age of Onset  . Hypertension Father   . Cancer Father   . Cancer Mother   . Thyroid disease Mother   . Rheum arthritis Mother   . Stroke Mother   . Cirrhosis Sister     SOCIAL HISTORY: History   Social History  . Marital Status: Single    Spouse Name: N/A    Number of Children: 0  . Years of  Education: 8   Occupational History  . Disabled    Social History Main Topics  . Smoking status: Current Every Day Smoker -- 1.50 packs/day    Types: Cigarettes  . Smokeless tobacco: Former Systems developer    Types: Chew  . Alcohol Use: No  . Drug Use: No  . Sexual Activity: Not on file   Other Topics Concern  . Not on file   Social History Narrative   Patient lives at home mother    Patient has no children.    Patient is single.    Patient has a 8th grade education.    Patient is disabled  PHYSICAL EXAM  Filed Vitals:   07/09/14 1442  BP: 134/86  Pulse: 69  Height: 5\' 1"  (1.549 m)  Weight: 147 lb (66.679 kg)   Body mass index is 27.79 kg/(m^2).  Generalized: Well developed, in no acute distress  Neck: Mallampati 4+, neck circumference 14 inches  Neurological examination  Mentation: Alert oriented to time, place, history taking. Follows all commands speech and language fluent Cranial nerve II-XII: Pupils were equal round reactive to light. Extraocular movements were full, visual field were full on confrontational test. Facial sensation and strength were normal. Uvula tongue midline. Head turning and shoulder shrug  were normal and symmetric. Motor: The motor testing reveals 5 over 5 strength of all 4 extremities. Good symmetric motor tone is noted throughout.  Sensory: Sensory testing is intact to soft touch on all 4 extremities. No evidence of extinction is noted.  Coordination: Cerebellar testing reveals good finger-nose-finger and heel-to-shin bilaterally.  Gait and station: Gait is normal. Tandem gait is normal. Romberg is negative. No drift is seen.  Reflexes: Deep tendon reflexes are symmetric and normal bilaterally.  Marland Kitchen   DIAGNOSTIC DATA (LABS, IMAGING, TESTING) - I reviewed patient records, labs, notes, testing and imaging myself where available.  Lab Results  Component Value Date   WBC 8.4 03/09/2014   HGB 13.8 03/09/2014   HCT 39.3 03/09/2014   MCV  95 03/09/2014   PLT 346 03/09/2014      Component Value Date/Time   NA 144 03/09/2014 1440   K 4.5 03/09/2014 1440   CL 102 03/09/2014 1440   CO2 23 03/09/2014 1440   GLUCOSE 81 03/09/2014 1440   BUN 6 03/09/2014 1440   CREATININE 0.76 03/09/2014 1440   CALCIUM 9.7 03/09/2014 1440   PROT 6.6 03/09/2014 1440   AST 16 03/09/2014 1440   ALT 12 03/09/2014 1440   ALKPHOS 70 03/09/2014 1440   BILITOT 0.3 03/09/2014 1440   GFRNONAA 94 03/09/2014 1440   GFRAA 109 03/09/2014 1440   Lab Results  Component Value Date   VITAMINB12 357 03/09/2014   Lab Results  Component Value Date   TSH 0.730 03/09/2014      ASSESSMENT AND PLAN 49 y.o. year old female  has a past medical history of Hypertension; Osteopenia; Sleep disorder (09/27/2012); Seizures (09/27/2012); and Headache(784.0) (03/09/2014). here with:  1. Seizures 2. Daytime sleepiness 3. Fatigue  The patient's seizures have been controlled. 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 She takes Zoloft and Xanax to keep her calm which in the past has controlled her seizures. The patient should continue Zoloft and Xanax. No refills needed today. She does complain of daytime sleepiness and fatigue. She had a sleep study that showed mild apnea. The patient however has not cut back on her caffeine intake. I have encouraged the patient to endorse better sleep hygiene. Such as establishing a regular bedtime and avoiding watching TV or playing on her phone 1 hour before bedtime. The patient verbalized understanding. If her symptoms worsen or she develops any new symptoms she should let us know.   Ward Givens, MSN, NP-C 07/09/2014, 3:09 PM [facility] [address] [place], [place] [address] [phone]  Note: This document was prepared with digital dictation and possible smart phrase technology. Any transcriptional errors that result from this process are unintentional.

## 2014-07-09 NOTE — Patient Instructions (Signed)
Continue Zoloft and Xanax.  Try to improve your sleep hygiene as well as limit caffeine intake.  If your symptoms worsen or you develop new symptoms please let us know.

## 2014-07-10 NOTE — Progress Notes (Signed)
I have read the note, and I agree with the clinical assessment and plan.  Crystal Lyons   

## 2014-08-25 ENCOUNTER — Other Ambulatory Visit: Payer: Self-pay | Admitting: Neurology

## 2014-08-25 ENCOUNTER — Telehealth: Payer: Self-pay | Admitting: *Deleted

## 2014-08-25 MED ORDER — ALPRAZOLAM 0.5 MG PO TABS: 0.5000 mg | ORAL_TABLET | Freq: Three times a day (TID) | ORAL | Status: DC | PRN

## 2014-08-25 NOTE — Telephone Encounter (Signed)
Pt is calling stating she needs a refill on ALPRAZolam (XANAX) 0.5 MG tablet called in to [facility].

## 2014-08-25 NOTE — Telephone Encounter (Signed)
Patient was calling about her Xanax refill. Patient stated that she did not have any left. Explained to patient that she had 5 refills in the computer. Patient will call the pharmacy to see if she had any left if not patient will call the office back.

## 2014-08-28 ENCOUNTER — Ambulatory Visit: Payer: Medicare PPO | Admitting: Neurology

## 2015-01-19 ENCOUNTER — Other Ambulatory Visit: Payer: Self-pay | Admitting: Neurology

## 2015-01-20 ENCOUNTER — Encounter: Payer: Self-pay | Admitting: Adult Health

## 2015-01-20 ENCOUNTER — Ambulatory Visit (INDEPENDENT_AMBULATORY_CARE_PROVIDER_SITE_OTHER): Payer: Medicare PPO | Admitting: Adult Health

## 2015-01-20 VITALS — BP 115/64 | HR 64 | Ht 61.0 in | Wt 142.0 lb

## 2015-01-20 DIAGNOSIS — R569 Unspecified convulsions: Secondary | ICD-10-CM

## 2015-01-20 DIAGNOSIS — G4719 Other hypersomnia: Secondary | ICD-10-CM

## 2015-01-20 NOTE — Progress Notes (Signed)
PATIENT: Crystal Lyons DOB: 12-Oct-1965  REASON FOR VISIT: follow up- seizures HISTORY FROM: patient  HISTORY OF PRESENT ILLNESS: Crystal Lyons is a 49 year old female with a history of intractable epilepsy. The patient returns today for follow-up. Since the patient had right temporal lobectomy she is not had any additional seizures. She however still has auras that used to accompany the seizures. She currently takes Zoloft and Xanax to help control this. At the last visit the patient was complaining of excessive daytime sleepiness. She was encouraged to endorse better sleep hygiene. Since then the patient states that she has not cut back on her caffeine. She continues to watch TV and play on her phone before bedtime. She denies any new symptoms. She returns today for an evaluation.  HISTORY 07/09/14: Crystal Lyons is a 49 year old female with a history of intractable epilepsy. The patient did have a right temporal lobectomy for the seizures. She is no longer on seizure medication. She states that she has not had a seizure since the surgery. She states that she continues to have the warning signs. When she had the seizures in the past she would grab at her stomach- that would last for 1-2 minutes then she would come out of it and be really sleepy. Her first seizure ever was a grand mal. The patient is not on seizure medication but she is on zoloft and xanax to help with the anxiety. She states if she stays calm are seizures are controlled. She has not been on seizure medication since 1987. She has tried tegretol in the past but could not tolerate the medication. At the last visit the patient was complaining of some burning sensations around the incision area as well as swelling. She continues to have this but it is intermittent. She also reported some issues with her memory and concentration. Blood work was done that was unremarkable. She states that her memory and concentration have remained the same. She  has not cut back on her caffeine intake. The patient does complain of daytime sleepiness and fatigue. In the past she has had a sleep study that showed mild apnea. The patient's Epworth score 14 and fatigue severity score is 46. She states that she goes to bed between 12 AM to 1:30 AM and will arrive at 9:30 AM. She does watch TV or play on her phone before bedtime. She also consumes caffeine in the evenings. Patient does confirm that she snores.   HISTORY 03/09/14 (WILLIS): Crystal Lyons is a 49 year old right-handed white female with a history of intractable epilepsy, status post a right temporal lobectomy for the seizures with good improvement. The patient is not on seizure medications, but she does take alprazolam if needed for anxiety. The patient has had chronic daily headaches throughout the head for number of years, but she has also had some burning sensations around the incision area on the right temporal area. The patient indicates that this burning sensation has worsened recently. The patient is feeling as if there is a swelling in the forehead. The patient recently underwent a CT scan of the head that was unremarkable. The patient has been reporting increased fatigue over the last several months, and some worsening of memory and concentration. She goes on to say that she takes 3 or 4 Goody Powders a day and she drinks about 40 ounces of Pepsi a day, and 2 cups of iced tea. The patient comes to this office for an evaluation  REVIEW OF SYSTEMS: Out of  a complete 14 system review of symptoms, the patient complains only of the following symptoms, and all other reviewed systems are negative.  Chills, fatigue, ear pain, ringing in ears, runny nose, trouble swallowing, eye itching, eye pain, wheezing, chest pain, leg swelling, murmur, nausea, apnea, snoring, joint pain, joint swelling, back pain, aching muscles, neck pain, neck stiffness, moles, itching, bruise/bleed easily, dizziness, headache, numbness,  seizure, weakness, confusion, nervous/anxious  ALLERGIES: Allergies  Allergen Reactions  . Adenosine Anaphylaxis    Heart block  . Aleve [Naproxen Sodium]   . Feldene [Piroxicam]   . Keflex [Cephalexin]   . Penicillins     HOME MEDICATIONS: Outpatient Prescriptions Prior to Visit  Medication Sig Dispense Refill  . ALPRAZolam (XANAX) 0.5 MG tablet Take 1 tablet (0.5 mg total) by mouth 3 (three) times daily as needed. 90 tablet 5  . azelastine (ASTELIN) 0.1 % nasal spray Place 1 spray into both nostrils 2 (two) times daily.     . baclofen (LIORESAL) 10 MG tablet 10 mg as needed.    . Calcium Carbonate (CALCIUM 600 PO) Take 600 mg by mouth.    . Cholecalciferol (VITAMIN D3) 5000 UNITS CAPS 5,000 Units daily.    Marland Kitchen estradiol (ESTRACE VAGINAL) 0.1 MG/GM vaginal cream Place 2 g vaginally. Bid weekly    . fexofenadine-pseudoephedrine (ALLEGRA-D 24) 180-240 MG per 24 hr tablet Take 1 tablet by mouth daily.    Marland Kitchen HYDROcodone-acetaminophen (NORCO/VICODIN) 5-325 MG per tablet     . hyoscyamine (ANASPAZ) 0.125 MG TBDP tablet Place 0.125 mg under the tongue every 4 (four) hours as needed.    . lansoprazole (PREVACID) 30 MG capsule Take 30 mg by mouth daily.     Marland Kitchen losartan (COZAAR) 100 MG tablet Take 100 mg by mouth daily.     Marland Kitchen NASONEX 50 MCG/ACT nasal spray     . Salicylic Acid 40 % PADS Apply 1 application topically as needed.    . sertraline (ZOLOFT) 100 MG tablet TAKE 1 TABLET BY MOUTH DAILY 30 tablet 0  . SPIRIVA HANDIHALER 18 MCG inhalation capsule Place 18 mcg into inhaler and inhale daily.     Marland Kitchen tiZANidine (ZANAFLEX) 4 MG tablet Take 4 mg by mouth as needed.     Marland Kitchen VIVELLE-DOT 0.05 MG/24HR     . ZINC OXIDE, TOPICAL, 10 % CREA Apply 1 application topically as needed.     No facility-administered medications prior to visit.    PAST MEDICAL HISTORY: Past Medical History  Diagnosis Date  . Hypertension   . Osteopenia   . Sleep disorder 09/27/2012  . Seizures 09/27/2012  .  Headache(784.0) 03/09/2014    PAST SURGICAL HISTORY: Past Surgical History  Procedure Laterality Date  . Brain surgery  01-06-85  . Shoulder surgery Bilateral 2009, 2010  . Gallbladder surgery N/A 2009  . Ears    . Neck surgery    . Eye surgery      cyst removed    FAMILY HISTORY: Family History  Problem Relation Age of Onset  . Hypertension Father   . Cancer Father   . Cancer Mother   . Thyroid disease Mother   . Rheum arthritis Mother   . Stroke Mother   . Cirrhosis Sister     SOCIAL HISTORY: History   Social History  . Marital Status: Single    Spouse Name: N/A  . Number of Children: 0  . Years of Education: 8   Occupational History  . Disabled    Social History Main  Topics  . Smoking status: Current Every Day Smoker -- 1.50 packs/day    Types: Cigarettes  . Smokeless tobacco: Former Systems developer    Types: Chew  . Alcohol Use: No  . Drug Use: No  . Sexual Activity: Not on file   Other Topics Concern  . Not on file   Social History Narrative   Patient lives at home mother    Patient has no children.    Patient is single.    Patient has a 8th grade education.    Patient is disabled            PHYSICAL EXAM  There were no vitals filed for this visit. There is no weight on file to calculate BMI.  Generalized: Well developed, in no acute distress   Neurological examination  Mentation: Alert oriented to time, place, history taking. Follows all commands speech and language fluent Cranial nerve II-XII: Pupils were equal round reactive to light. Extraocular movements were full, visual field were full on confrontational test. Facial sensation and strength were normal. Uvula tongue midline. Head turning and shoulder shrug  were normal and symmetric. Motor: The motor testing reveals 5 over 5 strength of all 4 extremities. Good symmetric motor tone is noted throughout.  Sensory: Sensory testing is intact to soft touch on all 4 extremities. No evidence of  extinction is noted.  Coordination: Cerebellar testing reveals good finger-nose-finger and heel-to-shin bilaterally.  Gait and station: Gait is normal. Tandem gait is normal. Romberg is negative. No drift is seen.  Reflexes: Deep tendon reflexes are symmetric and normal bilaterally.   DIAGNOSTIC DATA (LABS, IMAGING, TESTING) - I reviewed patient records, labs, notes, testing and imaging myself where available.  Lab Results  Component Value Date   WBC 8.4 03/09/2014   HGB 13.8 03/09/2014   HCT 39.3 03/09/2014   MCV 95 03/09/2014   PLT 346 03/09/2014      Component Value Date/Time   NA 144 03/09/2014 1440   K 4.5 03/09/2014 1440   CL 102 03/09/2014 1440   CO2 23 03/09/2014 1440   GLUCOSE 81 03/09/2014 1440   BUN 6 03/09/2014 1440   CREATININE 0.76 03/09/2014 1440   CALCIUM 9.7 03/09/2014 1440   PROT 6.6 03/09/2014 1440   AST 16 03/09/2014 1440   ALT 12 03/09/2014 1440   ALKPHOS 70 03/09/2014 1440   BILITOT 0.3 03/09/2014 1440   GFRNONAA 94 03/09/2014 1440   GFRAA 109 03/09/2014 1440    Lab Results  Component Value Date   VITAMINB12 357 03/09/2014   Lab Results  Component Value Date   TSH 0.730 03/09/2014      ASSESSMENT AND PLAN 49 y.o. year old female  has a past medical history of Hypertension; Osteopenia; Sleep disorder (09/27/2012); Seizures (09/27/2012); and Headache(784.0) (03/09/2014). here with:  1. Seizures 2. Excessive daytime sleepiness  The patient seizures have been controlled after surgery. She should continue taking Zoloft and Xanax to help control her auras. I have continued to encourage the patient to endorse better sleep hygiene. She verbalized understanding. If the patient's symptoms worsen or she develops new symptoms she should let us know. She will follow-up in one year or sooner if needed.     Ward Givens, MSN, NP-C 01/20/2015, 3:29 PM [facility] [address], [place], [place] [address] [phone]  Note:  This document was prepared with digital dictation and possible smart phrase technology. Any transcriptional errors that result from this process are unintentional.

## 2015-01-20 NOTE — Progress Notes (Signed)
I have read the note, and I agree with the clinical assessment and plan.  Crystal Lyons,Crystal Lyons   

## 2015-01-20 NOTE — Patient Instructions (Addendum)
Continue Zoloft.  If your symptoms worsen or you develop new symptoms please let us know.

## 2015-01-21 DIAGNOSIS — E785 Hyperlipidemia, unspecified: Secondary | ICD-10-CM

## 2015-01-21 HISTORY — DX: Hyperlipidemia, unspecified: E78.5

## 2015-02-12 ENCOUNTER — Other Ambulatory Visit: Payer: Self-pay | Admitting: Neurology

## 2015-03-15 DIAGNOSIS — G8929 Other chronic pain: Secondary | ICD-10-CM

## 2015-03-15 DIAGNOSIS — M4722 Other spondylosis with radiculopathy, cervical region: Secondary | ICD-10-CM

## 2015-03-15 DIAGNOSIS — M542 Cervicalgia: Secondary | ICD-10-CM

## 2015-03-15 HISTORY — DX: Other chronic pain: G89.29

## 2015-03-15 HISTORY — DX: Other spondylosis with radiculopathy, cervical region: M47.22

## 2015-03-15 HISTORY — DX: Cervicalgia: M54.2

## 2015-03-22 ENCOUNTER — Other Ambulatory Visit: Payer: Self-pay | Admitting: Neurology

## 2015-03-23 ENCOUNTER — Other Ambulatory Visit: Payer: Self-pay

## 2015-03-23 MED ORDER — ALPRAZOLAM 0.5 MG PO TABS: 0.5000 mg | ORAL_TABLET | Freq: Three times a day (TID) | ORAL | Status: DC | PRN

## 2015-03-23 NOTE — Telephone Encounter (Signed)
Rx signed and faxed.

## 2015-04-30 ENCOUNTER — Other Ambulatory Visit: Payer: Self-pay | Admitting: Obstetrics and Gynecology

## 2015-04-30 DIAGNOSIS — N959 Unspecified menopausal and perimenopausal disorder: Secondary | ICD-10-CM

## 2015-05-04 ENCOUNTER — Other Ambulatory Visit: Payer: Self-pay

## 2015-05-04 DIAGNOSIS — Z1231 Encounter for screening mammogram for malignant neoplasm of breast: Secondary | ICD-10-CM

## 2015-06-03 ENCOUNTER — Telehealth: Payer: Self-pay | Admitting: Adult Health

## 2015-06-03 NOTE — Telephone Encounter (Signed)
Pt called and is concerned about new regulations with her medications . She would like to speak with the nurse as soon as possible. Please call and advise [phone]

## 2015-06-03 NOTE — Telephone Encounter (Addendum)
Spoke to patient. She is requesting her pain management office ([place]) is called in reference to why she has to take Xanax for seizure control. Spoke to NP-Megan will send OV notes. Called patient, she request Medical Record Release form mailed. Mailed info.

## 2015-06-07 ENCOUNTER — Other Ambulatory Visit: Payer: Medicare HMO

## 2015-06-10 ENCOUNTER — Ambulatory Visit
Admission: RE | Admit: 2015-06-10 | Discharge: 2015-06-10 | Disposition: A | Discharge status: Home / Self Care | Payer: Medicare PPO | Source: Ambulatory Visit

## 2015-06-10 ENCOUNTER — Ambulatory Visit
Admission: RE | Admit: 2015-06-10 | Discharge: 2015-06-10 | Disposition: A | Discharge status: Home / Self Care | Payer: Medicare PPO | Source: Ambulatory Visit | Attending: Obstetrics and Gynecology | Admitting: Obstetrics and Gynecology

## 2015-06-10 DIAGNOSIS — N959 Unspecified menopausal and perimenopausal disorder: Secondary | ICD-10-CM

## 2015-06-10 DIAGNOSIS — Z1231 Encounter for screening mammogram for malignant neoplasm of breast: Secondary | ICD-10-CM

## 2015-08-03 NOTE — Telephone Encounter (Signed)
Pt called in and is requesting medical release form , she is in [place] and will drop by the office to sign. She needs last office notes to be sent to [facility]. No need to call pt back.

## 2015-09-20 ENCOUNTER — Other Ambulatory Visit: Payer: Self-pay | Admitting: Adult Health

## 2015-09-21 ENCOUNTER — Other Ambulatory Visit: Payer: Self-pay

## 2015-09-21 NOTE — Telephone Encounter (Signed)
Received a refill request for xanax for this pt. Controlled substances RXs are only valid for 6 months. Therefore, the RX written 03/23/2015 for xanax needs a new RX because it has expired, (even with 5 refills).

## 2016-01-26 ENCOUNTER — Ambulatory Visit (INDEPENDENT_AMBULATORY_CARE_PROVIDER_SITE_OTHER): Payer: Medicare PPO | Admitting: Adult Health

## 2016-01-26 ENCOUNTER — Encounter: Payer: Self-pay | Admitting: Adult Health

## 2016-01-26 ENCOUNTER — Telehealth: Payer: Self-pay | Admitting: Adult Health

## 2016-01-26 VITALS — BP 124/79 | HR 66 | Ht 61.0 in | Wt 147.0 lb

## 2016-01-26 DIAGNOSIS — R569 Unspecified convulsions: Secondary | ICD-10-CM
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
## 2016-01-26 NOTE — Patient Instructions (Signed)
Continue zoloft and xanax If your symptoms worsen or you develop new symptoms please let us know.

## 2016-01-26 NOTE — Progress Notes (Signed)
I have read the note, and I agree with the clinical assessment and plan.  Illana Nolting KEITH   

## 2016-02-18 ENCOUNTER — Other Ambulatory Visit: Payer: Self-pay | Admitting: Neurology

## 2016-04-05 ENCOUNTER — Telehealth: Payer: Self-pay | Admitting: Neurology

## 2016-04-05 NOTE — Telephone Encounter (Signed)
I called the patient. She has complained of discomfort at the surgical site on the head for years, there does not seem to be anything new going on. I will take a look at the patient when I see her in November.

## 2016-04-05 NOTE — Telephone Encounter (Signed)
Patient called to request MRI, states she has knots on forehead, head hurts, front of head, back of head, right side "same side I had surgery on". Has upcoming appointment with Dr. Jannifer Franklin on November 20th, wants to know if she should have MRI before this appointment.

## 2016-04-05 NOTE — Telephone Encounter (Signed)
I spoke to patient. Crystal Lyons states that Crystal Lyons was just wondering if MRI is needed. Patient understands that Dr. Jannifer Franklin is out of the office today, and would like Dr. Jannifer Franklin to address when he returns.

## 2016-04-18 ENCOUNTER — Other Ambulatory Visit: Payer: Self-pay | Admitting: *Deleted

## 2016-04-18 MED ORDER — ALPRAZOLAM 0.5 MG PO TABS: 0.5000 mg | ORAL_TABLET | Freq: Three times a day (TID) | ORAL | 0 refills | Status: DC | PRN

## 2016-04-18 NOTE — Telephone Encounter (Signed)
Rx printed, signed, faxed to pharmacy. 

## 2016-04-18 NOTE — Telephone Encounter (Signed)
Has appt in 05-08-16 with Dr. Jannifer Franklin.

## 2016-04-26 ENCOUNTER — Telehealth: Payer: Self-pay | Admitting: Neurology
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
## 2016-04-28 NOTE — Telephone Encounter (Signed)
Spoke to pt earlier in the week when she accompanied another pt to an appt. She reported that she did get Xanax filled 04/18/16 but still had plenty left over from her last rx as she takes prn. Hydrocodone has not been prescribed by her office. A new rx for Xanax can be sent in at the end of the month (05/17/16).
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
## 2016-05-08 ENCOUNTER — Ambulatory Visit (INDEPENDENT_AMBULATORY_CARE_PROVIDER_SITE_OTHER): Payer: Medicare PPO | Admitting: Neurology

## 2016-05-08 ENCOUNTER — Encounter: Payer: Self-pay | Admitting: Neurology

## 2016-05-08 VITALS — BP 124/76 | HR 94 | Resp 14 | Ht 61.0 in | Wt 146.5 lb

## 2016-05-08 DIAGNOSIS — G479 Sleep disorder, unspecified: Secondary | ICD-10-CM | POA: Diagnosis not present

## 2016-05-08 DIAGNOSIS — G8929 Other chronic pain: Secondary | ICD-10-CM | POA: Insufficient documentation

## 2016-05-08 DIAGNOSIS — R51 Headache: Secondary | ICD-10-CM | POA: Diagnosis not present

## 2016-05-08 DIAGNOSIS — R519 Headache, unspecified: Secondary | ICD-10-CM

## 2016-05-08 HISTORY — DX: Other chronic pain: G89.29

## 2016-05-08 HISTORY — DX: Headache, unspecified: R51.9

## 2016-05-08 MED ORDER — LIDOCAINE 5 % EX OINT: 1.0000 "application " | TOPICAL_OINTMENT | Freq: Three times a day (TID) | CUTANEOUS | 3 refills | Status: DC | PRN

## 2016-05-08 MED ORDER — ALPRAZOLAM 0.5 MG PO TABS: 0.5000 mg | ORAL_TABLET | Freq: Three times a day (TID) | ORAL | 3 refills | Status: DC | PRN

## 2016-05-08 MED ORDER — SERTRALINE HCL 100 MG PO TABS: 150.0000 mg | ORAL_TABLET | Freq: Every day | ORAL | 3 refills | Status: DC
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
## 2016-05-09 ENCOUNTER — Other Ambulatory Visit: Payer: Self-pay | Admitting: Obstetrics and Gynecology

## 2016-05-09 DIAGNOSIS — Z1231 Encounter for screening mammogram for malignant neoplasm of breast: Secondary | ICD-10-CM

## 2016-05-10 ENCOUNTER — Telehealth: Payer: Self-pay | Admitting: Neurology

## 2016-05-10 MED ORDER — ZONISAMIDE 25 MG PO CAPS: ORAL_CAPSULE | ORAL | 1 refills | Status: DC

## 2016-05-10 NOTE — Telephone Encounter (Signed)
I called the patient. The insurance will not cover lidocaine ointment and will not cover diclofenac ointment. The patient will be given a trial on Zonegran, we will work up on the dose gradually.

## 2016-05-10 NOTE — Telephone Encounter (Signed)
Returned pt Crystal Lyons. Let her know that PA was denied for headache. Suggested that she try OTC topical pain relief ointment but she declined saying that she "doesn't want to get that cream all in her hair." She has Norco previously prescribed for back pain and Xanax for auras. Asks if there is something else that she could try for HA.

## 2016-05-10 NOTE — Telephone Encounter (Signed)
Pt called said she was told by [facility] that the  lidocaine ointment is not covered and would require PA. Please call

## 2016-05-10 NOTE — Addendum Note (Signed)
Addended by: Margette Fast on: 05/10/2016 06:19 PM   Modules accepted: Orders

## 2016-05-10 NOTE — Telephone Encounter (Signed)
Pt called back to check on PA status.

## 2016-06-13 ENCOUNTER — Ambulatory Visit
Admission: RE | Admit: 2016-06-13 | Discharge: 2016-06-13 | Disposition: A | Discharge status: Home / Self Care | Payer: Medicare PPO | Source: Ambulatory Visit | Attending: Obstetrics and Gynecology | Admitting: Obstetrics and Gynecology

## 2016-06-13 DIAGNOSIS — Z1231 Encounter for screening mammogram for malignant neoplasm of breast: Secondary | ICD-10-CM

## 2016-07-07 ENCOUNTER — Telehealth: Payer: Self-pay | Admitting: Neurology

## 2016-07-07 MED ORDER — SERTRALINE HCL 100 MG PO TABS: 150.0000 mg | ORAL_TABLET | Freq: Every day | ORAL | 3 refills | Status: DC
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
## 2016-07-07 NOTE — Telephone Encounter (Signed)
Unfortunately, has per office policy and the controled substance agreement, any controlled substance that is lost or stolen will not be refilled early.  We will give no early refills for alprazolam.

## 2016-07-11 MED ORDER — CLONAZEPAM 0.5 MG PO TABS: 0.5000 mg | ORAL_TABLET | Freq: Every day | ORAL | 0 refills | Status: DC
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
## 2016-07-11 NOTE — Addendum Note (Signed)
Addended by: Margette Fast on: 07/11/2016 11:00 AM   Modules accepted: Orders

## 2016-07-11 NOTE — Telephone Encounter (Signed)
I called the patient. I will call in a small Rx of clonazepam to get her to the next Rx.

## 2016-07-11 NOTE — Telephone Encounter (Signed)
Rx printed, signed, faxed to pharmacy. 
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
## 2016-10-06 ENCOUNTER — Telehealth: Payer: Self-pay | Admitting: Neurology

## 2016-10-10 NOTE — Telephone Encounter (Signed)
Rx printed, awaiting CW,MD signature

## 2016-10-10 NOTE — Telephone Encounter (Signed)
Faxed printed/signed rx to pt pharmacy. Fax: [phone]. Received confirmation.

## 2016-10-10 NOTE — Telephone Encounter (Signed)
Called and spoke with patient. Advised I faxed rx to her pharmacy. She verbalized understanding.

## 2016-10-10 NOTE — Telephone Encounter (Signed)
Pt calling because she said the pharmacy has reached out 2 to 3 times for a refill of the ALPRAZolam (XANAX) 0.5 MG tablet and have heard nothing.  Pt said she has enough pills until this weekend and she will be out, please call,

## 2016-11-06 ENCOUNTER — Ambulatory Visit (INDEPENDENT_AMBULATORY_CARE_PROVIDER_SITE_OTHER): Payer: Medicare PPO | Admitting: Neurology

## 2016-11-06 ENCOUNTER — Encounter: Payer: Self-pay | Admitting: Neurology

## 2016-11-06 VITALS — BP 108/75 | HR 92 | Ht 61.0 in | Wt 145.0 lb

## 2016-11-06 DIAGNOSIS — R51 Headache: Secondary | ICD-10-CM

## 2016-11-06 DIAGNOSIS — R569 Unspecified convulsions: Secondary | ICD-10-CM | POA: Diagnosis not present

## 2016-11-06 DIAGNOSIS — R519 Headache, unspecified: Secondary | ICD-10-CM

## 2016-11-06 MED ORDER — NORTRIPTYLINE HCL 10 MG PO CAPS
10.0000 mg | ORAL_CAPSULE | Freq: Every day | ORAL | 3 refills | Status: DC
Start: 2016-11-06 — End: 2017-09-10

## 2016-11-06 MED ORDER — SERTRALINE HCL 100 MG PO TABS: 100.0000 mg | ORAL_TABLET | Freq: Every day | ORAL | Status: DC

## 2016-11-06 NOTE — Progress Notes (Signed)
Reason for visit: Headache  Crystal Lyons is an 51 y.o. female  History of present illness:  Crystal Lyons is a 51 year old right-handed white female with a history of seizures, status post a temporal lobectomy. The patient has aura to the seizures, she is not had any actual seizure events. She has been placed on zonegran for her headaches, she could not tolerate doses higher than 25 mg daily secondary to drowsiness. She has cut back on her Zoloft as a dose of the 150 mg resulted in irritability. She is now taking 100 mg daily. She has not been able to tolerate multiple anticonvulsant medications in the past, and she claims to have an allergy to gabapentin, the reaction is unknown. The patient continues to have bitemporal headaches and she has some pain in the back of the head and neck. The patient indicates that her headaches are daily, better in the morning and worse as the day goes on. She is under a lot of stress living with her mother. She is somewhat irritable, and forgetful.  Past Medical History:  Diagnosis Date  . Headache(784.0) 03/09/2014  . Hypertension   . Osteopenia   . Seizures ([place]) 09/27/2012  . Sleep disorder 09/27/2012    Past Surgical History:  Procedure Laterality Date  . BRAIN SURGERY  01-06-85  . ears    . EYE SURGERY     cyst removed  . GALLBLADDER SURGERY N/A 2009  . NECK SURGERY    . SHOULDER SURGERY Bilateral 2009, 2010    Family History  Problem Relation Age of Onset  . Hypertension Father   . Cancer Father   . Cancer Mother   . Thyroid disease Mother   . Rheum arthritis Mother   . Stroke Mother   . Cirrhosis Sister     Social history:  reports that she has been smoking Cigarettes.  She has been smoking about 1.50 packs per day. She has quit using smokeless tobacco. Her smokeless tobacco use included Chew. She reports that she does not drink alcohol or use drugs.    Allergies  Allergen Reactions  . Adenosine Anaphylaxis    Heart block  . Aleve  [Naproxen Sodium]   . Feldene [Piroxicam]   . Keflex [Cephalexin]   . Mobic [Meloxicam]   . Neurontin [Gabapentin]   . Other     Any anti-inflammatory  . Penicillins   . Vioxx [Rofecoxib]     Medications:  Prior to Admission medications   Medication Sig Start Date End Date Taking? Authorizing Provider  albuterol (PROAIR HFA) 108 (90 Base) MCG/ACT inhaler Inhale 2 puffs into the lungs every 6 (six) hours as needed for wheezing or shortness of breath.   Yes [provider]  ALPRAZolam Duanne Moron) 0.5 MG tablet TAKE 1 TABLET BY MOUTH 3 TIMES DAILY AS NEEDED 10/10/16  Yes Kathrynn Ducking, MD  azelastine (ASTELIN) 0.1 % nasal spray Place 1 spray into both nostrils 2 (two) times daily.  01/09/14  Yes [provider]  fexofenadine-pseudoephedrine (ALLEGRA-D 24) 180-240 MG per 24 hr tablet Take 1 tablet by mouth daily. 02/26/14  Yes [provider]  furosemide (LASIX) 20 MG tablet  01/21/16  Yes [provider]  HYDROcodone-acetaminophen (NORCO/VICODIN) 5-325 MG per tablet  09/19/12  Yes [provider]  hyoscyamine (ANASPAZ) 0.125 MG TBDP tablet Place 0.125 mg under the tongue every 4 (four) hours as needed.   Yes [provider]  lansoprazole (PREVACID) 30 MG capsule Take 30 mg by  mouth daily.  09/19/12  Yes [provider]  losartan (COZAAR) 100 MG tablet Take 100 mg by mouth daily.  09/19/12  Yes [provider]  NASONEX 50 MCG/ACT nasal spray  09/19/12  Yes [provider]  omeprazole (PRILOSEC) 20 MG capsule daily as needed. 12/08/15  Yes [provider]  sertraline (ZOLOFT) 100 MG tablet Take 1.5 tablets (150 mg total) by mouth daily. Patient taking differently: Take 100 mg by mouth daily.  07/07/16  Yes Dohmeier, Asencion Partridge, MD  Teriparatide, Recombinant, (FORTEO) 600 MCG/2.4ML SOLN  08/02/15  Yes [provider]  tiZANidine (ZANAFLEX) 4 MG tablet Take 4 mg by mouth as needed.  01/17/13  Yes [provider]  VIVELLE-DOT 0.05 MG/24HR  09/19/12  Yes [provider]  ZETIA 10 MG tablet  01/21/16  Yes [provider]  ZINC OXIDE, TOPICAL, 10 % CREA Apply 1 application topically as needed. 02/26/14  Yes [provider]  zonisamide (ZONEGRAN) 25 MG capsule One capsule twice daily for one week, then take 2 capsules twice daily Patient taking differently: Take 25 mg by mouth daily. One capsule twice daily for one week, then take 2 capsules twice daily 05/10/16  Yes Kathrynn Ducking, MD  promethazine (PHENERGAN) 12.5 MG tablet Take 12.5 mg by mouth daily as needed. 11/30/15 06/27/16  [provider]    ROS:  Out of a complete 14 system review of symptoms, the patient complains only of the following symptoms, and all other reviewed systems are negative.  Fatigue Ear pain, ringing in the ears, runny nose, difficulty swallowing Light sensitivity Cough, wheezing Chest pain, leg swelling, heart murmur Cold intolerance, excessive thirst Sleep apnea, snoring, sleep talking Incontinence of the bladder Leg pain, joint swelling, back pain, muscle cramps, neck pain, neck stiffness Moles, itching bruising easily Dizziness, headache, numbness, seizures   Blood pressure 108/75, pulse 92, height 5\' 1"  (1.549 m), weight 145 lb (65.8 kg).  Physical Exam  General: The patient is alert and cooperative at the time of the examination.  Skin: No significant peripheral edema is noted.   Neurologic Exam  Mental status: The patient is alert and oriented x 3 at the time of the examination. The patient has apparent normal recent and remote memory, with an apparently normal attention span and concentration ability.   Cranial nerves: Facial symmetry is present. Speech is normal, no aphasia or dysarthria is noted. Extraocular movements are full. Visual fields are full.  Motor: The patient has good strength in all 4 extremities.  Sensory examination: Soft touch sensation is symmetric  on the face, arms, and legs.  Coordination: The patient has good finger-nose-finger and heel-to-shin bilaterally.  Gait and station: The patient has a normal gait. Tandem gait is normal. Romberg is negative. No drift is seen.  Reflexes: Deep tendon reflexes are symmetric.   Assessment/Plan:  1. Chronic daily headache   2. History of seizures, right temporal lobectomy   The patient will be taken off of the Zonegran, she will be placed on nortriptyline for the headache in low dose. She is not sleeping well, this should help her sleep. She will call me if she is able to tolerate the 10 mg dose, we will gradually increase the dosing. She will follow-up in 4 months.   Jill Alexanders MD 11/06/2016 2:31 PM  [facility] [address] [place], [place] [address]  Phone [phone] Fax [phone]

## 2016-11-06 NOTE — Patient Instructions (Signed)
   We will start nortriptyline 10 mg at night.  Pamelor (nortriptyline) is an antidepressant medication that has many uses that may include headache, whiplash injuries, or for peripheral neuropathy pain. Side effects may include drowsiness, dry mouth, blurred vision, or constipation. As with any antidepressant medication, worsening depression may occur. If you had any significant side effects, please call our office. The full effects of this medication may take 7-10 days after starting the drug, or going up on the dose.

## 2017-01-22 ENCOUNTER — Ambulatory Visit: Payer: Medicare PPO | Admitting: Cardiology

## 2017-01-25 ENCOUNTER — Ambulatory Visit: Payer: Medicare PPO | Admitting: Cardiology

## 2017-02-06 ENCOUNTER — Ambulatory Visit (INDEPENDENT_AMBULATORY_CARE_PROVIDER_SITE_OTHER): Payer: Medicare PPO | Admitting: Cardiology

## 2017-02-06 ENCOUNTER — Encounter: Payer: Self-pay | Admitting: Cardiology

## 2017-02-06 VITALS — BP 110/70 | HR 64 | Resp 10 | Ht 61.0 in | Wt 142.8 lb

## 2017-02-06 DIAGNOSIS — G40909 Epilepsy, unspecified, not intractable, without status epilepticus: Secondary | ICD-10-CM

## 2017-02-06 DIAGNOSIS — M542 Cervicalgia: Secondary | ICD-10-CM | POA: Diagnosis not present

## 2017-02-06 DIAGNOSIS — G8929 Other chronic pain: Secondary | ICD-10-CM

## 2017-02-06 DIAGNOSIS — E785 Hyperlipidemia, unspecified: Secondary | ICD-10-CM

## 2017-02-06 NOTE — Progress Notes (Signed)
Cardiology Office Note:    Date:  02/06/2017   ID:  Crystal Lyons, DOB 07-04-1965, MRN [id number]  PCP:  Myer Peer, MD  Cardiologist:  Jenne Campus, MD    Referring MD: Myer Peer, MD   Chief Complaint  Patient presents with  . Follow-up  Chest pain  History of Present Illness:    Crystal Lyons is a 51 y.o. female  history of chronic arm and chest pain. She comes to office follow-up. He complaining of the same pain that she is experiencing is more worse with moving her shoulder worse with touching her chest wall. There is no exertional chest pain. She does complain of some shortness of breath with exertion but overall seems to be doing well. She takes her medication not on the regular basis and I talked to her about the fact that she needs to take medication daily.  Past Medical History:  Diagnosis Date  . Atypical chest pain   . Headache(784.0) 03/09/2014  . Hyperlipidemia   . Hypertension   . Osteopenia   . Seizures ([place]) 09/27/2012  . Sleep disorder 09/27/2012    Past Surgical History:  Procedure Laterality Date  . BRAIN SURGERY  01-06-85  . ears    . EYE SURGERY     cyst removed  . GALLBLADDER SURGERY N/A 2009  . NECK SURGERY    . SHOULDER SURGERY Bilateral 2009, 2010    Current Medications: Current Meds  Medication Sig  . ALPRAZolam (XANAX) 0.5 MG tablet TAKE 1 TABLET BY MOUTH 3 TIMES DAILY AS NEEDED  . Aspirin-Acetaminophen-Caffeine (GOODYS EXTRA STRENGTH PO) Take 1 Package by mouth as needed.  Marland Kitchen azelastine (ASTELIN) 0.1 % nasal spray Place 1 spray into both nostrils 2 (two) times daily.   . Cholecalciferol (VITAMIN D3) 5000 units CAPS Take 1 capsule by mouth daily.  . colesevelam (WELCHOL) 625 MG tablet Take 3 tablets by mouth 2 (two) times daily.  . fexofenadine-pseudoephedrine (ALLEGRA-D 24) 180-240 MG per 24 hr tablet Take 1 tablet by mouth daily.  . fluticasone (FLONASE) 50 MCG/ACT nasal spray Place 2 sprays into both nostrils daily.  .  furosemide (LASIX) 20 MG tablet Take 20 mg by mouth daily.   Marland Kitchen gentamicin (GARAMYCIN) 0.3 % ophthalmic solution   . Homeopathic Products (SIMILASAN DRY EYE RELIEF OP) Apply 1-2 drops to eye daily.  Marland Kitchen HYDROcodone-acetaminophen (NORCO/VICODIN) 5-325 MG per tablet Take 1 tablet by mouth 4 (four) times daily.   . lansoprazole (PREVACID) 30 MG capsule Take 30 mg by mouth daily.   Marland Kitchen losartan (COZAAR) 100 MG tablet Take 100 mg by mouth daily.   Marland Kitchen NASONEX 50 MCG/ACT nasal spray Place 2 sprays into the nose daily.   Marland Kitchen neomycin-polymyxin b-dexamethasone (MAXITROL) 3.5-10000-0.1 SUSP Place 1 drop into both eyes 4 (four) times daily.  Marland Kitchen neomycin-polymyxin-hydrocortisone (CORTISPORIN) 3.5-10000-1 OTIC suspension Place into both ears.  . nortriptyline (PAMELOR) 10 MG capsule Take 1 capsule (10 mg total) by mouth at bedtime.  Marland Kitchen Phenylephrine-Acetaminophen (TYLENOL SINUS CONGESTION/PAIN PO) Take by mouth.  . sertraline (ZOLOFT) 100 MG tablet Take 1 tablet (100 mg total) by mouth daily.  . Teriparatide, Recombinant, 600 MCG/2.4ML SOLN Inject 0.08 mLs into the skin daily.  Marland Kitchen tiZANidine (ZANAFLEX) 4 MG tablet Take 4 mg by mouth as needed.   Marland Kitchen VIVELLE-DOT 0.05 MG/24HR Place 1 patch onto the skin 2 (two) times a week.   Marland Kitchen ZETIA 10 MG tablet Take 10 mg by mouth daily.   Marland Kitchen ZINC OXIDE,  TOPICAL, 10 % CREA Apply 1 application topically as needed.  . zonisamide (ZONEGRAN) 25 MG capsule Take 1 capsule by mouth daily.     Allergies:   Adenosine; Aleve [naproxen sodium]; Feldene [piroxicam]; Keflex [cephalexin]; Mobic [meloxicam]; Neurontin [gabapentin]; Other; Penicillins; and Vioxx [rofecoxib]   Social History   Social History  . Marital status: Single    Spouse name: N/A  . Number of children: 0  . Years of education: 8   Occupational History  . Disabled    Social History Main Topics  . Smoking status: Current Every Day Smoker    Packs/day: 1.50    Types: Cigarettes  . Smokeless tobacco: Never Used  .  Alcohol use No  . Drug use: No  . Sexual activity: Not Asked   Other Topics Concern  . None   Social History Narrative   Patient lives at home mother    Patient has no children.    Patient is single.    Patient has a 8th grade education.    Patient is disabled           Family History: The patient's family history includes Cancer in her father and mother; Cirrhosis in her sister; Diabetes in her father; Heart failure in her father; Hypertension in her father and mother; Rheum arthritis in her mother; Stroke in her mother; Thyroid disease in her mother. ROS:   Please see the history of present illness.    All 14 point review of systems negative except as described per history of present illness  EKGs/Labs/Other Studies Reviewed:      Recent Labs: No results found for requested labs within last 8760 hours.  Recent Lipid Panel No results found for: CHOL, TRIG, HDL, CHOLHDL, VLDL, LDLCALC, LDLDIRECT  Physical Exam:    VS:  BP 110/70   Pulse 64   Resp 10   Ht 5\' 1"  (1.549 m)   Wt 142 lb 12.8 oz (64.8 kg)   BMI 26.98 kg/m     Wt Readings from Last 3 Encounters:  02/06/17 142 lb 12.8 oz (64.8 kg)  11/06/16 145 lb (65.8 kg)  05/08/16 146 lb 8 oz (66.5 kg)     GEN:  Well nourished, well developed in no acute distress HEENT: Normal NECK: No JVD; No carotid bruits LYMPHATICS: No lymphadenopathy CARDIAC: RRR, no murmurs, no rubs, no gallops RESPIRATORY:  Clear to auscultation without rales, wheezing or rhonchi  ABDOMEN: Soft, non-tender, non-distended MUSCULOSKELETAL:  No edema; No deformity  SKIN: Warm and dry LOWER EXTREMITIES: no swelling NEUROLOGIC:  Alert and oriented x 3 PSYCHIATRIC:  Normal affect   ASSESSMENT:    1. Chronic neck pain   2. Dyslipidemia   3. Seizure disorder ([place])    PLAN:    In order of problems listed above:  1. Chronic neck pain: Follow-up with a specialist. 2. Atypical chest pain: Multiple study previously did not show any  significant coronary artery disease. There is no single suture that suggests angina. We'll continue present management. 3. Dyslipidemia: Does not take medications the radial basis. I told her that this is critically important to take medication regularly. Check a fasting lipid profile today. 4. Seizure disorder followed by primary care physician and urologist. 5. I will ask her to have fasting lipid profile done today as well as hemoglobin A1c and Chem-7   Medication Adjustments/Labs and Tests Ordered: Current medicines are reviewed at length with the patient today.  Concerns regarding medicines are outlined above.  No orders of  the defined types were placed in this encounter.  Medication changes: No orders of the defined types were placed in this encounter.   Signed, Park Liter, MD, [facility] 02/06/2017 3:14 PM    [place]

## 2017-02-06 NOTE — Patient Instructions (Signed)
Medication Instructions:  Your physician recommends that you continue on your current medications as directed. Please refer to the Current Medication list given to you today.  Labwork: Your physician recommends that you have lab work drawn today to check your kidney level, sodium and potassium, cholesterol and a1c.  Testing/Procedures: None  Follow-Up: Your physician wants you to follow-up in: 6 months.  You will receive a reminder letter in the mail two months in advance. If you don't receive a letter, please call our office to schedule the follow-up appointment. Any Other Special Instructions Will Be Listed Below (If Applicable).  Please note that any paperwork needing to be filled out by the provider will need to be addressed at the front desk prior to seeing the provider. Please note that any paperwork FMLA, Disability or other documents regarding health condition is subject to a $25.00 charge that must be received prior to completion of paperwork in the form of a money order or check.    If you need a refill on your cardiac medications before your next appointment, please call your pharmacy.

## 2017-02-06 NOTE — Addendum Note (Signed)
Addended by: Kathyrn Sheriff on: 02/06/2017 03:37 PM   Modules accepted: Orders

## 2017-02-07 DIAGNOSIS — Z780 Asymptomatic menopausal state: Secondary | ICD-10-CM | POA: Insufficient documentation

## 2017-02-07 LAB — LIPID PANEL
CHOL/HDL RATIO: 6.2 ratio — AB (ref 0.0–4.4)
Cholesterol, Total: 240 mg/dL — ABNORMAL HIGH (ref 100–199)
HDL: 39 mg/dL — AB (ref >39)
LDL CALC: 131 mg/dL — AB (ref 0–99)
TRIGLYCERIDES: 349 mg/dL — AB (ref 0–149)
VLDL Cholesterol Cal: 70 mg/dL — ABNORMAL HIGH (ref 5–40)

## 2017-02-07 LAB — BASIC METABOLIC PANEL
BUN / CREAT RATIO: 11 (ref 9–23)
BUN: 9 mg/dL (ref 6–24)
CO2: 27 mmol/L (ref 20–29)
Calcium: 10 mg/dL (ref 8.7–10.2)
Chloride: 99 mmol/L (ref 96–106)
Creatinine, Ser: 0.81 mg/dL (ref 0.57–1.00)
GFR calc Af Amer: 99 mL/min/{1.73_m2} (ref >59)
GFR, EST NON AFRICAN AMERICAN: 86 mL/min/{1.73_m2} (ref >59)
Glucose: 90 mg/dL (ref 65–99)
Potassium: 4.4 mmol/L (ref 3.5–5.2)
Sodium: 140 mmol/L (ref 134–144)

## 2017-02-07 LAB — HEMOGLOBIN A1C
Est. average glucose Bld gHb Est-mCnc: 103 mg/dL
Hgb A1c MFr Bld: 5.2 % (ref 4.8–5.6)

## 2017-03-13 ENCOUNTER — Encounter: Payer: Self-pay | Admitting: Adult Health

## 2017-03-13 ENCOUNTER — Ambulatory Visit (INDEPENDENT_AMBULATORY_CARE_PROVIDER_SITE_OTHER): Payer: Medicare PPO | Admitting: Adult Health

## 2017-03-13 VITALS — BP 109/70 | HR 74 | Wt 143.4 lb

## 2017-03-13 DIAGNOSIS — R51 Headache: Secondary | ICD-10-CM

## 2017-03-13 DIAGNOSIS — R569 Unspecified convulsions: Secondary | ICD-10-CM

## 2017-03-13 DIAGNOSIS — R519 Headache, unspecified: Secondary | ICD-10-CM

## 2017-03-13 NOTE — Patient Instructions (Signed)
Your Plan:  Retry Nortriptyline 10 mg at bedtime If your symptoms worsen or you develop new symptoms please let us know.    Thank you for coming to see [name] at [facility]. I hope we have been able to provide you high quality care today.  You may receive a patient satisfaction survey over the next few weeks. We would appreciate your feedback and comments so that we may continue to improve ourselves and the health of our patients.

## 2017-03-13 NOTE — Progress Notes (Signed)
PATIENT: Crystal Lyons DOB: April 19, 1966  REASON FOR VISIT: follow up HISTORY FROM: patient  HISTORY OF PRESENT ILLNESS Today 03/13/17 Crystal Lyons is a 51 year old female with a history of seizures-status post a temporal lobectomy and headaches. She returns today for follow-up. At the last visit she was taken off Zonegran placed on nortriptyline. She states that she has not been taking nortriptyline consistently. She states that it makes her quite sleepy the first night she took it. She states that she has a daily headache. She reports that she always has a dull headache on the right side. She also gets sharp shooting pains at her incision site. She reports photophobia but denies phonophobia. Reports on occasion she will have some nausea. The patient is also on Forteo he reports that this decreases her blood pressure and occasionally she'll have some dizzy episodes. She reports that she will be stopping this medication soon. She denies any seizure events. Reports that she still gets her aura but no seizure. She returns today for an evaluation.  HISTORY 11/06/16: Crystal Lyons is a 51 year old right-handed white female with a history of seizures, status post a temporal lobectomy. The patient has aura to the seizures, she is not had any actual seizure events. She has been placed on zonegran for her headaches, she could not tolerate doses higher than 25 mg daily secondary to drowsiness. She has cut back on her Zoloft as a dose of the 150 mg resulted in irritability. She is now taking 100 mg daily. She has not been able to tolerate multiple anticonvulsant medications in the past, and she claims to have an allergy to gabapentin, the reaction is unknown. The patient continues to have bitemporal headaches and she has some pain in the back of the head and neck. The patient indicates that her headaches are daily, better in the morning and worse as the day goes on. She is under a lot of stress living with her  mother. She is somewhat irritable, and forgetful.  REVIEW OF SYSTEMS: Out of a complete 14 system review of symptoms, the patient complains only of the following symptoms, and all other reviewed systems are negative.  Eye itching, fatigue, light sensitivity, eye pain, heat intolerance, excessive thirst, abdominal pain, wheezing, ear pain, ringing in ears, trouble swallowing, chest pain, leg swelling, murmur, diarrhea, nausea, apnea, snoring, joint pain, back pain, muscle cramps, neck pain, neck stiffness, itching, nervous or changes, agitation, dizziness, headache, numbness, seizures,  ALLERGIES: Allergies  Allergen Reactions  . Adenosine Anaphylaxis    Heart block  . Aleve [Naproxen Sodium]   . Feldene [Piroxicam]   . Keflex [Cephalexin]   . Mobic [Meloxicam]   . Neurontin [Gabapentin]   . Other     Any anti-inflammatory  . Penicillins   . Vioxx [Rofecoxib]     HOME MEDICATIONS: Outpatient Medications Prior to Visit  Medication Sig Dispense Refill  . ALPRAZolam (XANAX) 0.5 MG tablet TAKE 1 TABLET BY MOUTH 3 TIMES DAILY AS NEEDED 90 tablet 3  . Aspirin-Acetaminophen-Caffeine (GOODYS EXTRA STRENGTH PO) Take 1 Package by mouth as needed.    Marland Kitchen azelastine (ASTELIN) 0.1 % nasal spray Place 1 spray into both nostrils 2 (two) times daily.     . Cholecalciferol (VITAMIN D3) 5000 units CAPS Take 1 capsule by mouth daily.    . colesevelam (WELCHOL) 625 MG tablet Take 3 tablets by mouth 2 (two) times daily.    . fexofenadine-pseudoephedrine (ALLEGRA-D 24) 180-240 MG per 24 hr tablet Take 1  tablet by mouth daily.    . fluticasone (FLONASE) 50 MCG/ACT nasal spray Place 2 sprays into both nostrils daily.    . furosemide (LASIX) 20 MG tablet Take 20 mg by mouth daily.     . Homeopathic Products (SIMILASAN DRY EYE RELIEF OP) Apply 1-2 drops to eye daily.    Marland Kitchen HYDROcodone-acetaminophen (NORCO/VICODIN) 5-325 MG per tablet Take 1 tablet by mouth 4 (four) times daily.     . lansoprazole (PREVACID) 30  MG capsule Take 30 mg by mouth daily.     Marland Kitchen losartan (COZAAR) 100 MG tablet Take 100 mg by mouth daily.     Marland Kitchen NASONEX 50 MCG/ACT nasal spray Place 2 sprays into the nose daily.     . nortriptyline (PAMELOR) 10 MG capsule Take 1 capsule (10 mg total) by mouth at bedtime. 30 capsule 3  . Phenylephrine-Acetaminophen (TYLENOL SINUS CONGESTION/PAIN PO) Take by mouth.    . sertraline (ZOLOFT) 100 MG tablet Take 1 tablet (100 mg total) by mouth daily.    . Teriparatide, Recombinant, 600 MCG/2.4ML SOLN Inject 0.08 mLs into the skin daily.    Marland Kitchen tiZANidine (ZANAFLEX) 4 MG tablet Take 4 mg by mouth as needed.     Marland Kitchen VIVELLE-DOT 0.05 MG/24HR Place 1 patch onto the skin 2 (two) times a week.     Marland Kitchen ZETIA 10 MG tablet Take 10 mg by mouth daily.     Marland Kitchen gentamicin (GARAMYCIN) 0.3 % ophthalmic solution     . neomycin-polymyxin b-dexamethasone (MAXITROL) 3.5-10000-0.1 SUSP Place 1 drop into both eyes 4 (four) times daily.    Marland Kitchen neomycin-polymyxin-hydrocortisone (CORTISPORIN) 3.5-10000-1 OTIC suspension Place into both ears.    . promethazine (PHENERGAN) 12.5 MG tablet Take 12.5 mg by mouth daily as needed.    Marland Kitchen ZINC OXIDE, TOPICAL, 10 % CREA Apply 1 application topically as needed.    . zonisamide (ZONEGRAN) 25 MG capsule Take 1 capsule by mouth daily.     No facility-administered medications prior to visit.     PAST MEDICAL HISTORY: Past Medical History:  Diagnosis Date  . Atypical chest pain   . Headache(784.0) 03/09/2014  . Hyperlipidemia   . Hypertension   . Osteopenia   . Seizures ([place]) 09/27/2012  . Sleep disorder 09/27/2012    PAST SURGICAL HISTORY: Past Surgical History:  Procedure Laterality Date  . BRAIN SURGERY  01-06-85  . ears    . EYE SURGERY     cyst removed  . GALLBLADDER SURGERY N/A 2009  . NECK SURGERY    . SHOULDER SURGERY Bilateral 2009, 2010    FAMILY HISTORY: Family History  Problem Relation Age of Onset  . Hypertension Father   . Cancer Father   . Diabetes Father   .  Heart failure Father   . Cancer Mother   . Thyroid disease Mother   . Rheum arthritis Mother   . Stroke Mother   . Hypertension Mother   . Cirrhosis Sister     SOCIAL HISTORY: Social History   Social History  . Marital status: Single    Spouse name: N/A  . Number of children: 0  . Years of education: 8   Occupational History  . Disabled    Social History Main Topics  . Smoking status: Current Every Day Smoker    Packs/day: 1.50    Types: Cigarettes  . Smokeless tobacco: Never Used  . Alcohol use No  . Drug use: No  . Sexual activity: Not on file   Other Topics  Concern  . Not on file   Social History Narrative   Patient lives at home mother    Patient has no children.    Patient is single.    Patient has a 8th grade education.    Patient is disabled            PHYSICAL EXAM  Vitals:   03/13/17 1329  BP: 109/70  Pulse: 74  Weight: 143 lb 6.4 oz (65 kg)   Body mass index is 27.1 kg/m.  Generalized: Well developed, in no acute distress   Neurological examination  Mentation: Alert oriented to time, place, history taking. Follows all commands speech and language fluent Cranial nerve II-XII: Pupils were equal round reactive to light. Extraocular movements were full, visual field were full on confrontational test. Facial sensation and strength were normal. Uvula tongue midline. Head turning and shoulder shrug  were normal and symmetric. Motor: The motor testing reveals 5 over 5 strength of all 4 extremities. Good symmetric motor tone is noted throughout.  Sensory: Sensory testing is intact to soft touch on all 4 extremities. No evidence of extinction is noted.  Coordination: Cerebellar testing reveals good finger-nose-finger and heel-to-shin bilaterally.  Gait and station: Gait is normal. Tandem gait is normal. Romberg is negative. No drift is seen.  Reflexes: Deep tendon reflexes are symmetric and normal bilaterally.   DIAGNOSTIC DATA (LABS, IMAGING,  TESTING) - I reviewed patient records, labs, notes, testing and imaging myself where available.  Lab Results  Component Value Date   WBC 8.4 03/09/2014   HGB 13.8 03/09/2014   HCT 39.3 03/09/2014   MCV 95 03/09/2014   PLT 346 03/09/2014      Component Value Date/Time   NA 140 02/06/2017 1536   K 4.4 02/06/2017 1536   CL 99 02/06/2017 1536   CO2 27 02/06/2017 1536   GLUCOSE 90 02/06/2017 1536   BUN 9 02/06/2017 1536   CREATININE 0.81 02/06/2017 1536   CALCIUM 10.0 02/06/2017 1536   PROT 6.6 03/09/2014 1440   ALBUMIN 4.3 03/09/2014 1440   AST 16 03/09/2014 1440   ALT 12 03/09/2014 1440   ALKPHOS 70 03/09/2014 1440   BILITOT 0.3 03/09/2014 1440   GFRNONAA 86 02/06/2017 1536   GFRAA 99 02/06/2017 1536   Lab Results  Component Value Date   CHOL 240 (H) 02/06/2017   HDL 39 (L) 02/06/2017   LDLCALC 131 (H) 02/06/2017   TRIG 349 (H) 02/06/2017   CHOLHDL 6.2 (H) 02/06/2017   Lab Results  Component Value Date   HGBA1C 5.2 02/06/2017   Lab Results  Component Value Date   VITAMINB12 357 03/09/2014   Lab Results  Component Value Date   TSH 0.730 03/09/2014      ASSESSMENT AND PLAN 51 y.o. year old female  has a past medical history of Atypical chest pain; Headache(784.0) (03/09/2014); Hyperlipidemia; Hypertension; Osteopenia; Seizures ([place]) (09/27/2012); and Sleep disorder (09/27/2012). here with:  1. Seizures 2. Headaches  The patient is encouraged try taking nortriptyline 10 mg at bedtime consistently. I advised that this could be beneficial for her headaches. She voiced understanding. She is advised that if her symptoms worsen or she develops new symptoms she should let us know. She will follow-up in 6 months or sooner if needed.     Ward Givens, MSN, NP-C 03/13/2017, 1:38 PM [facility] [address] [place], [place] [address] [phone]

## 2017-03-13 NOTE — Progress Notes (Signed)
I have read the note, and I agree with the clinical assessment and plan.  Crystal Lyons,Crystal Lyons   

## 2017-04-06 ENCOUNTER — Other Ambulatory Visit: Payer: Self-pay | Admitting: Obstetrics and Gynecology

## 2017-04-06 ENCOUNTER — Other Ambulatory Visit: Payer: Self-pay | Admitting: Neurology

## 2017-04-06 DIAGNOSIS — Z1231 Encounter for screening mammogram for malignant neoplasm of breast: Secondary | ICD-10-CM

## 2017-04-09 NOTE — Telephone Encounter (Signed)
Faxed printed/signed rx alprazolam to [facility] at [phone]. Received fax confirmation.

## 2017-06-14 ENCOUNTER — Ambulatory Visit
Admission: RE | Admit: 2017-06-14 | Discharge: 2017-06-14 | Disposition: A | Discharge status: Home / Self Care | Payer: Medicare PPO | Source: Ambulatory Visit | Attending: Obstetrics and Gynecology | Admitting: Obstetrics and Gynecology

## 2017-06-14 DIAGNOSIS — Z1231 Encounter for screening mammogram for malignant neoplasm of breast: Secondary | ICD-10-CM

## 2017-06-22 DIAGNOSIS — M810 Age-related osteoporosis without current pathological fracture: Secondary | ICD-10-CM | POA: Diagnosis not present

## 2017-06-22 DIAGNOSIS — E559 Vitamin D deficiency, unspecified: Secondary | ICD-10-CM | POA: Diagnosis not present

## 2017-06-22 DIAGNOSIS — Z7189 Other specified counseling: Secondary | ICD-10-CM | POA: Diagnosis not present

## 2017-06-25 DIAGNOSIS — L720 Epidermal cyst: Secondary | ICD-10-CM | POA: Diagnosis not present

## 2017-06-29 DIAGNOSIS — Z86010 Personal history of colonic polyps: Secondary | ICD-10-CM | POA: Diagnosis not present

## 2017-06-29 DIAGNOSIS — R1012 Left upper quadrant pain: Secondary | ICD-10-CM | POA: Diagnosis not present

## 2017-06-29 DIAGNOSIS — R1013 Epigastric pain: Secondary | ICD-10-CM | POA: Diagnosis not present

## 2017-06-29 DIAGNOSIS — K648 Other hemorrhoids: Secondary | ICD-10-CM | POA: Diagnosis not present

## 2017-06-29 DIAGNOSIS — K2270 Barrett's esophagus without dysplasia: Secondary | ICD-10-CM | POA: Diagnosis not present

## 2017-06-29 DIAGNOSIS — I10 Essential (primary) hypertension: Secondary | ICD-10-CM | POA: Diagnosis not present

## 2017-06-29 DIAGNOSIS — K210 Gastro-esophageal reflux disease with esophagitis: Secondary | ICD-10-CM | POA: Diagnosis not present

## 2017-06-29 DIAGNOSIS — G4730 Sleep apnea, unspecified: Secondary | ICD-10-CM | POA: Diagnosis not present

## 2017-06-29 DIAGNOSIS — D127 Benign neoplasm of rectosigmoid junction: Secondary | ICD-10-CM | POA: Diagnosis not present

## 2017-06-29 DIAGNOSIS — D126 Benign neoplasm of colon, unspecified: Secondary | ICD-10-CM | POA: Diagnosis not present

## 2017-06-29 DIAGNOSIS — Z1211 Encounter for screening for malignant neoplasm of colon: Secondary | ICD-10-CM | POA: Diagnosis not present

## 2017-06-29 DIAGNOSIS — K635 Polyp of colon: Secondary | ICD-10-CM | POA: Diagnosis not present

## 2017-06-29 DIAGNOSIS — K644 Residual hemorrhoidal skin tags: Secondary | ICD-10-CM | POA: Diagnosis not present

## 2017-06-29 DIAGNOSIS — K621 Rectal polyp: Secondary | ICD-10-CM | POA: Diagnosis not present

## 2017-06-29 DIAGNOSIS — K219 Gastro-esophageal reflux disease without esophagitis: Secondary | ICD-10-CM | POA: Diagnosis not present

## 2017-07-12 DIAGNOSIS — M5416 Radiculopathy, lumbar region: Secondary | ICD-10-CM | POA: Diagnosis not present

## 2017-07-12 DIAGNOSIS — G894 Chronic pain syndrome: Secondary | ICD-10-CM | POA: Diagnosis not present

## 2017-07-12 DIAGNOSIS — M5137 Other intervertebral disc degeneration, lumbosacral region: Secondary | ICD-10-CM | POA: Diagnosis not present

## 2017-07-12 DIAGNOSIS — M5030 Other cervical disc degeneration, unspecified cervical region: Secondary | ICD-10-CM | POA: Diagnosis not present

## 2017-07-12 DIAGNOSIS — M961 Postlaminectomy syndrome, not elsewhere classified: Secondary | ICD-10-CM | POA: Diagnosis not present

## 2017-07-12 DIAGNOSIS — M4722 Other spondylosis with radiculopathy, cervical region: Secondary | ICD-10-CM | POA: Diagnosis not present

## 2017-07-18 ENCOUNTER — Other Ambulatory Visit: Payer: Self-pay | Admitting: Cardiology

## 2017-07-26 DIAGNOSIS — Z6828 Body mass index (BMI) 28.0-28.9, adult: Secondary | ICD-10-CM | POA: Diagnosis not present

## 2017-07-26 DIAGNOSIS — M5412 Radiculopathy, cervical region: Secondary | ICD-10-CM | POA: Diagnosis not present

## 2017-07-26 DIAGNOSIS — Z1331 Encounter for screening for depression: Secondary | ICD-10-CM | POA: Diagnosis not present

## 2017-07-30 DIAGNOSIS — K219 Gastro-esophageal reflux disease without esophagitis: Secondary | ICD-10-CM | POA: Diagnosis not present

## 2017-07-30 DIAGNOSIS — R1310 Dysphagia, unspecified: Secondary | ICD-10-CM | POA: Diagnosis not present

## 2017-08-03 DIAGNOSIS — R1310 Dysphagia, unspecified: Secondary | ICD-10-CM | POA: Diagnosis not present

## 2017-08-03 DIAGNOSIS — K449 Diaphragmatic hernia without obstruction or gangrene: Secondary | ICD-10-CM | POA: Diagnosis not present

## 2017-08-13 ENCOUNTER — Ambulatory Visit (INDEPENDENT_AMBULATORY_CARE_PROVIDER_SITE_OTHER): Payer: Medicare HMO | Admitting: Cardiology

## 2017-08-13 ENCOUNTER — Encounter: Payer: Self-pay | Admitting: Cardiology

## 2017-08-13 VITALS — BP 114/68 | HR 77 | Ht 61.0 in | Wt 150.8 lb

## 2017-08-13 DIAGNOSIS — G40909 Epilepsy, unspecified, not intractable, without status epilepticus: Secondary | ICD-10-CM

## 2017-08-13 DIAGNOSIS — E785 Hyperlipidemia, unspecified: Secondary | ICD-10-CM

## 2017-08-13 DIAGNOSIS — R0789 Other chest pain: Secondary | ICD-10-CM | POA: Diagnosis not present

## 2017-08-13 NOTE — Progress Notes (Signed)
Cardiology Office Note:    Date:  08/13/2017   ID:  Crystal Lyons, DOB 1966-03-06, MRN [id number]  PCP:  Myer Peer, MD  Cardiologist:  Jenne Campus, MD    Referring MD: Myer Peer, MD   Chief Complaint  Patient presents with  . Follow-up  I have a back pain  History of Present Illness:    Crystal Lyons is a 52 y.o. female with dyslipidemia hypertension.  Also chronic chest pain multiple testing showing no problem.  Since she was diagnosed with Barrett's esophagus as well as hiatal hernia.  That being treated aggressively and she is feeling better.  Leading complaint today appears to be back pain going towards the right arm and this is a chronic problem and she apparently had some injection scheduled to hopefully improve the situation.  If not she may require surgery.  Denies having exertional chest pain tightness squeezing pressure in chest.  Still smokes.  He was told to quit  Past Medical History:  Diagnosis Date  . Atypical chest pain   . Barrett esophagus   . Headache(784.0) 03/09/2014  . Hiatal hernia   . Hyperlipidemia   . Hypertension   . Osteopenia   . Seizures ([place]) 09/27/2012  . Sleep disorder 09/27/2012    Past Surgical History:  Procedure Laterality Date  . BRAIN SURGERY  01-06-85  . ears    . EYE SURGERY     cyst removed  . GALLBLADDER SURGERY N/A 2009  . NECK SURGERY    . SHOULDER SURGERY Bilateral 2009, 2010    Current Medications: Current Meds  Medication Sig  . ALPRAZolam (XANAX) 0.5 MG tablet TAKE 1 TABLET BY MOUTH 3 TIMES DAILY AS NEEDED  . Aspirin-Acetaminophen-Caffeine (GOODYS EXTRA STRENGTH PO) Take 1 Package by mouth as needed.  Marland Kitchen azelastine (ASTELIN) 0.1 % nasal spray Place 1 spray into both nostrils 2 (two) times daily.   . colesevelam (WELCHOL) 625 MG tablet Take 3 tablets by mouth 2 (two) times daily.  . fluticasone (FLONASE) 50 MCG/ACT nasal spray Place 2 sprays into both nostrils daily.  . furosemide (LASIX) 20 MG tablet  TAKE 1 TABLET BY MOUTH ONCE DAILY  . gentamicin (GARAMYCIN) 0.3 % ophthalmic solution   . Homeopathic Products (SIMILASAN DRY EYE RELIEF OP) Apply 1-2 drops to eye daily.  Marland Kitchen HYDROcodone-acetaminophen (NORCO/VICODIN) 5-325 MG per tablet Take 1 tablet by mouth 4 (four) times daily.   . lansoprazole (PREVACID) 30 MG capsule Take 30 mg by mouth daily.   Marland Kitchen losartan (COZAAR) 100 MG tablet Take 100 mg by mouth daily.   Marland Kitchen NASONEX 50 MCG/ACT nasal spray Place 2 sprays into the nose daily.   Marland Kitchen neomycin-polymyxin b-dexamethasone (MAXITROL) 3.5-10000-0.1 SUSP Place 1 drop into both eyes 4 (four) times daily.  Marland Kitchen neomycin-polymyxin-hydrocortisone (CORTISPORIN) 3.5-10000-1 OTIC suspension Place into both ears.  . nortriptyline (PAMELOR) 10 MG capsule Take 1 capsule (10 mg total) by mouth at bedtime.  . pantoprazole (PROTONIX) 40 MG tablet Take 2 tablets by mouth daily.  Marland Kitchen Phenylephrine-Acetaminophen (TYLENOL SINUS CONGESTION/PAIN PO) Take by mouth.  . sertraline (ZOLOFT) 100 MG tablet Take 1 tablet (100 mg total) by mouth daily.  Marland Kitchen tiZANidine (ZANAFLEX) 4 MG tablet Take 4 mg by mouth as needed.   . triamcinolone cream (KENALOG) 0.1 % Apply topically 2 (two) times daily.  Marland Kitchen VIVELLE-DOT 0.05 MG/24HR Place 1 patch onto the skin 2 (two) times a week.   Marland Kitchen ZETIA 10 MG tablet Take 10 mg by mouth  daily.   Marland Kitchen ZINC OXIDE, TOPICAL, 10 % CREA Apply 1 application topically as needed.  . zonisamide (ZONEGRAN) 25 MG capsule Take 1 capsule by mouth daily.     Allergies:   Adenosine; Aleve [naproxen sodium]; Feldene [piroxicam]; Keflex [cephalexin]; Mobic [meloxicam]; Neurontin [gabapentin]; Other; Penicillins; and Vioxx [rofecoxib]   Social History   Socioeconomic History  . Marital status: Single    Spouse name: None  . Number of children: 0  . Years of education: 8  . Highest education level: None  Social Needs  . Financial resource strain: None  . Food insecurity - worry: None  . Food insecurity - inability:  None  . Transportation needs - medical: None  . Transportation needs - non-medical: None  Occupational History  . Occupation: Disabled  Tobacco Use  . Smoking status: Current Every Day Smoker    Packs/day: 1.50    Types: Cigarettes  . Smokeless tobacco: Never Used  Substance and Sexual Activity  . Alcohol use: No  . Drug use: No  . Sexual activity: None  Other Topics Concern  . None  Social History Narrative   Patient lives at home mother    Patient has no children.    Patient is single.    Patient has a 8th grade education.    Patient is disabled           Family History: The patient's family history includes Cancer in her father and mother; Cirrhosis in her sister; Diabetes in her father; Heart failure in her father; Hypertension in her father and mother; Rheum arthritis in her mother; Stroke in her mother; Thyroid disease in her mother. ROS:   Please see the history of present illness.    All 14 point review of systems negative except as described per history of present illness  EKGs/Labs/Other Studies Reviewed:      Recent Labs: 02/06/2017: BUN 9; Creatinine, Ser 0.81; Potassium 4.4; Sodium 140  Recent Lipid Panel    Component Value Date/Time   CHOL 240 (H) 02/06/2017 1536   TRIG 349 (H) 02/06/2017 1536   HDL 39 (L) 02/06/2017 1536   CHOLHDL 6.2 (H) 02/06/2017 1536   LDLCALC 131 (H) 02/06/2017 1536    Physical Exam:    VS:  BP 114/68   Pulse 77   Ht 5\' 1"  (1.549 m)   Wt 150 lb 12.8 oz (68.4 kg)   SpO2 98%   BMI 28.49 kg/m     Wt Readings from Last 3 Encounters:  08/13/17 150 lb 12.8 oz (68.4 kg)  03/13/17 143 lb 6.4 oz (65 kg)  02/06/17 142 lb 12.8 oz (64.8 kg)     GEN:  Well nourished, well developed in no acute distress HEENT: Normal NECK: No JVD; No carotid bruits LYMPHATICS: No lymphadenopathy CARDIAC: RRR, no murmurs, no rubs, no gallops RESPIRATORY:  Clear to auscultation without rales, wheezing or rhonchi  ABDOMEN: Soft, non-tender,  non-distended MUSCULOSKELETAL:  No edema; No deformity  SKIN: Warm and dry LOWER EXTREMITIES: no swelling NEUROLOGIC:  Alert and oriented x 3 PSYCHIATRIC:  Normal affect   ASSESSMENT:    1. Seizure disorder ([place])   2. Dyslipidemia   3. Atypical chest pain    PLAN:    In order of problems listed above:  1. Seizure disorders: Followed by neurology doing well. 2. Dyslipidemia we will recheck fasting lipid profile 3. Atypical chest pain: Denies having any.   Medication Adjustments/Labs and Tests Ordered: Current medicines are reviewed at length with the  patient today.  Concerns regarding medicines are outlined above.  No orders of the defined types were placed in this encounter.  Medication changes: No orders of the defined types were placed in this encounter.   Signed, Park Liter, MD, [facility] 08/13/2017 3:05 PM    [place]

## 2017-08-13 NOTE — Patient Instructions (Signed)
Medication Instructions:  Your physician recommends that you continue on your current medications as directed. Please refer to the Current Medication list given to you today.  Labwork: Your physician recommends that [facility] lab work today: Lipid  Testing/Procedures: None ordered  Follow-Up: Your physician recommends that you schedule a follow-up appointment in: 6 month follow up with Dr. Agustin Cree in [place]  Any Other Special Instructions Will Be Listed Below (If Applicable).     If you need a refill on your cardiac medications before your next appointment, please call your pharmacy.

## 2017-08-14 LAB — LIPID PANEL
CHOLESTEROL TOTAL: 204 mg/dL — AB (ref 100–199)
Chol/HDL Ratio: 5.7 ratio — ABNORMAL HIGH (ref 0.0–4.4)
HDL: 36 mg/dL — ABNORMAL LOW (ref >39)
LDL Calculated: 115 mg/dL — ABNORMAL HIGH (ref 0–99)
TRIGLYCERIDES: 264 mg/dL — AB (ref 0–149)
VLDL CHOLESTEROL CAL: 53 mg/dL — AB (ref 5–40)

## 2017-08-17 DIAGNOSIS — J22 Unspecified acute lower respiratory infection: Secondary | ICD-10-CM | POA: Diagnosis not present

## 2017-08-21 DIAGNOSIS — G8929 Other chronic pain: Secondary | ICD-10-CM | POA: Diagnosis not present

## 2017-08-21 DIAGNOSIS — M542 Cervicalgia: Secondary | ICD-10-CM | POA: Diagnosis not present

## 2017-08-31 DIAGNOSIS — G4700 Insomnia, unspecified: Secondary | ICD-10-CM | POA: Diagnosis not present

## 2017-08-31 DIAGNOSIS — I10 Essential (primary) hypertension: Secondary | ICD-10-CM | POA: Diagnosis not present

## 2017-09-10 ENCOUNTER — Ambulatory Visit: Payer: Medicare HMO | Admitting: Adult Health

## 2017-09-10 ENCOUNTER — Encounter: Payer: Self-pay | Admitting: Adult Health

## 2017-09-10 VITALS — BP 110/72 | HR 84 | Wt 147.4 lb

## 2017-09-10 DIAGNOSIS — F4323 Adjustment disorder with mixed anxiety and depressed mood: Secondary | ICD-10-CM | POA: Diagnosis not present

## 2017-09-10 DIAGNOSIS — G4489 Other headache syndrome: Secondary | ICD-10-CM | POA: Diagnosis not present

## 2017-09-10 DIAGNOSIS — R569 Unspecified convulsions: Secondary | ICD-10-CM

## 2017-09-10 NOTE — Patient Instructions (Signed)
Your Plan:  Continue Zoloft 50 mg  Continue Xanax  If your symptoms worsen or you develop new symptoms please let us know.   Thank you for coming to see [name] at [facility]. I hope we have been able to provide you high quality care today.  You may receive a patient satisfaction survey over the next few weeks. We would appreciate your feedback and comments so that we may continue to improve ourselves and the health of our patients.

## 2017-09-10 NOTE — Progress Notes (Signed)
I have read the note, and I agree with the clinical assessment and plan.  Charles K Willis   

## 2017-09-10 NOTE — Progress Notes (Signed)
PATIENT: Crystal Lyons DOB: 09/26/1965  REASON FOR VISIT: follow up HISTORY FROM: patient  HISTORY OF PRESENT ILLNESS: Today 09/10/17:  Crystal Lyons is a 52 year old female with a history of seizures status  temporal lobectomy and headaches.  She returns today for follow-up.  She states that she has sharp shooting pains at the incision site from her lobectomy.  This is been ongoing for some time now.  She reports that it can happen throughout the day but it only lasts for seconds.  She states that her headaches have gotten better now that her blood pressure is under better control.  She continues to take Xanax 2-3 times a day.  She is able to operate a motor vehicle.  She complete all ADLs independently.  She reports that her mom may have lung cancer and she was recently diagnosed with Barrett's esophagus.  She states that she is been under a lot of stress.  Reports that she has stopped Zoloft but is thinking she should restart it due to current events.  She returns today for evaluation.  HISTORY 03/13/17:03/13/17 Crystal Lyons is a 53 year old female with a history of seizures-status post a temporal lobectomy and headaches. She returns today for follow-up. At the last visit she was taken off Zonegran placed on nortriptyline. She states that she has not been taking nortriptyline consistently. She states that it makes her quite sleepy the first night she took it. She states that she has a daily headache. She reports that she always has a dull headache on the right side. She also gets sharp shooting pains at her incision site. She reports photophobia but denies phonophobia. Reports on occasion she will have some nausea. The patient is also on Forteo he reports that this decreases her blood pressure and occasionally she'll have some dizzy episodes. She reports that she will be stopping this medication soon. She denies any seizure events. Reports that she still gets her aura but no seizure. She returns  today for an evaluation.     REVIEW OF SYSTEMS: Out of a complete 14 system review of symptoms, the patient complains only of the following symptoms, and all other reviewed systems are negative.  Chills, fever, eye itching, light sensitivity, eye pain, cough, wheezing, shortness of breath, chest tightness, chest pain, ear pain, ringing in ears, runny nose, drooling, apnea, snoring, nausea, black stools, abdominal pain, cold intolerance, heat intolerance, joint pain, joint swelling, back pain, aching muscles, bruise easily, dizziness, headache, numbness, seizures, weakness, nervous/anxious, hyperactive  ALLERGIES: Allergies  Allergen Reactions  . Adenosine Anaphylaxis    Heart block  . Aleve [Naproxen Sodium]   . Feldene [Piroxicam]   . Keflex [Cephalexin]   . Mobic [Meloxicam]   . Neurontin [Gabapentin]   . Other     Any anti-inflammatory  . Penicillins   . Vioxx [Rofecoxib]     HOME MEDICATIONS: Outpatient Medications Prior to Visit  Medication Sig Dispense Refill  . ALPRAZolam (XANAX) 0.5 MG tablet TAKE 1 TABLET BY MOUTH 3 TIMES DAILY AS NEEDED 90 tablet 3  . Aspirin-Acetaminophen-Caffeine (GOODYS EXTRA STRENGTH PO) Take 1 Package by mouth as needed.    Marland Kitchen azelastine (ASTELIN) 0.1 % nasal spray Place 1 spray into both nostrils 2 (two) times daily.     . fluticasone (FLOVENT DISKUS) 50 MCG/BLIST diskus inhaler Inhale into the lungs.    Marland Kitchen gentamicin (GARAMYCIN) 0.3 % ophthalmic solution     . Homeopathic Products (SIMILASAN DRY EYE RELIEF OP) Apply 1-2 drops to  eye daily.    Marland Kitchen HYDROcodone-acetaminophen (NORCO/VICODIN) 5-325 MG per tablet Take 1 tablet by mouth 4 (four) times daily.     . hydrOXYzine (ATARAX/VISTARIL) 25 MG tablet Take 25 mg by mouth as needed (at bedtime).    . lansoprazole (PREVACID) 30 MG capsule Take 30 mg by mouth daily.     Marland Kitchen NASONEX 50 MCG/ACT nasal spray Place 2 sprays into the nose daily.     Marland Kitchen neomycin-polymyxin b-dexamethasone (MAXITROL) 3.5-10000-0.1  SUSP Place 1 drop into both eyes 4 (four) times daily.    . pantoprazole (PROTONIX) 40 MG tablet Take 2 tablets by mouth daily.    Marland Kitchen Phenylephrine-Acetaminophen (TYLENOL SINUS CONGESTION/PAIN PO) Take by mouth.    . promethazine (PHENERGAN) 12.5 MG tablet Take 12.5 mg by mouth daily as needed.    . sertraline (ZOLOFT) 100 MG tablet Take 1 tablet (100 mg total) by mouth daily.    Marland Kitchen telmisartan-hydrochlorothiazide (MICARDIS HCT) 80-25 MG tablet Take 1 tablet by mouth daily.    Marland Kitchen tiotropium (SPIRIVA HANDIHALER) 18 MCG inhalation capsule     . triamcinolone cream (KENALOG) 0.1 % Apply topically 2 (two) times daily.    Marland Kitchen VIVELLE-DOT 0.05 MG/24HR Place 1 patch onto the skin 2 (two) times a week.     Marland Kitchen ZETIA 10 MG tablet Take 10 mg by mouth daily.     . fluticasone (FLONASE) 50 MCG/ACT nasal spray Place 2 sprays into both nostrils daily.    . nortriptyline (PAMELOR) 10 MG capsule Take 1 capsule (10 mg total) by mouth at bedtime. (Patient not taking: Reported on 09/10/2017) 30 capsule 3  . tiZANidine (ZANAFLEX) 4 MG tablet Take 4 mg by mouth as needed.     . colesevelam (WELCHOL) 625 MG tablet Take 3 tablets by mouth 2 (two) times daily.    . furosemide (LASIX) 20 MG tablet TAKE 1 TABLET BY MOUTH ONCE DAILY 30 tablet 11  . losartan (COZAAR) 100 MG tablet Take 100 mg by mouth daily.     Marland Kitchen neomycin-polymyxin-hydrocortisone (CORTISPORIN) 3.5-10000-1 OTIC suspension Place into both ears.    Marland Kitchen ZINC OXIDE, TOPICAL, 10 % CREA Apply 1 application topically as needed.    . zonisamide (ZONEGRAN) 25 MG capsule Take 1 capsule by mouth daily.     No facility-administered medications prior to visit.     PAST MEDICAL HISTORY: Past Medical History:  Diagnosis Date  . Atypical chest pain   . Barrett esophagus   . Headache(784.0) 03/09/2014  . Hiatal hernia   . Hyperlipidemia   . Hypertension   . Osteopenia   . Seizures ([place]) 09/27/2012  . Sleep disorder 09/27/2012    PAST SURGICAL HISTORY: Past Surgical  History:  Procedure Laterality Date  . BRAIN SURGERY  01-06-85  . ears    . EYE SURGERY     cyst removed  . GALLBLADDER SURGERY N/A 2009  . NECK SURGERY    . SHOULDER SURGERY Bilateral 2009, 2010    FAMILY HISTORY: Family History  Problem Relation Age of Onset  . Hypertension Father   . Cancer Father   . Diabetes Father   . Heart failure Father   . Cancer Mother   . Thyroid disease Mother   . Rheum arthritis Mother   . Stroke Mother   . Hypertension Mother   . Cirrhosis Sister     SOCIAL HISTORY: Social History   Socioeconomic History  . Marital status: Single    Spouse name: Not on file  . Number of  children: 0  . Years of education: 8  . Highest education level: Not on file  Occupational History  . Occupation: Disabled  Social Needs  . Financial resource strain: Not on file  . Food insecurity:    Worry: Not on file    Inability: Not on file  . Transportation needs:    Medical: Not on file    Non-medical: Not on file  Tobacco Use  . Smoking status: Current Every Day Smoker    Packs/day: 1.50    Types: Cigarettes  . Smokeless tobacco: Never Used  Substance and Sexual Activity  . Alcohol use: No  . Drug use: No  . Sexual activity: Not on file  Lifestyle  . Physical activity:    Days per week: Not on file    Minutes per session: Not on file  . Stress: Not on file  Relationships  . Social connections:    Talks on phone: Not on file    Gets together: Not on file    Attends religious service: Not on file    Active member of club or organization: Not on file    Attends meetings of clubs or organizations: Not on file    Relationship status: Not on file  . Intimate partner violence:    Fear of current or ex partner: Not on file    Emotionally abused: Not on file    Physically abused: Not on file    Forced sexual activity: Not on file  Other Topics Concern  . Not on file  Social History Narrative   Patient lives at home mother    Patient has no  children.    Patient is single.    Patient has a 8th grade education.    Patient is disabled            PHYSICAL EXAM  Vitals:   09/10/17 1351  BP: 110/72  Pulse: 84  Weight: 147 lb 6.4 oz (66.9 kg)   Body mass index is 27.85 kg/m.  Generalized: Well developed, in no acute distress   Neurological examination  Mentation: Alert oriented to time, place, history taking. Follows all commands speech and language fluent Cranial nerve II-XII: Pupils were equal round reactive to light. Extraocular movements were full, visual field were full on confrontational test. Facial sensation and strength were normal. Uvula tongue midline. Head turning and shoulder shrug  were normal and symmetric. Motor: The motor testing reveals 5 over 5 strength of all 4 extremities. Good symmetric motor tone is noted throughout.  Sensory: Sensory testing is intact to soft touch on all 4 extremities. No evidence of extinction is noted.  Coordination: Cerebellar testing reveals good finger-nose-finger and heel-to-shin bilaterally.  Gait and station: Gait is normal. Tandem gait is normal. Romberg is negative. No drift is seen.  Reflexes: Deep tendon reflexes are symmetric and normal bilaterally.   DIAGNOSTIC DATA (LABS, IMAGING, TESTING) - I reviewed patient records, labs, notes, testing and imaging myself where available.  Lab Results  Component Value Date   WBC 8.4 03/09/2014   HGB 13.8 03/09/2014   HCT 39.3 03/09/2014   MCV 95 03/09/2014   PLT 346 03/09/2014      Component Value Date/Time   NA 140 02/06/2017 1536   K 4.4 02/06/2017 1536   CL 99 02/06/2017 1536   CO2 27 02/06/2017 1536   GLUCOSE 90 02/06/2017 1536   BUN 9 02/06/2017 1536   CREATININE 0.81 02/06/2017 1536   CALCIUM 10.0 02/06/2017 1536  PROT 6.6 03/09/2014 1440   ALBUMIN 4.3 03/09/2014 1440   AST 16 03/09/2014 1440   ALT 12 03/09/2014 1440   ALKPHOS 70 03/09/2014 1440   BILITOT 0.3 03/09/2014 1440   GFRNONAA 86 02/06/2017  1536   GFRAA 99 02/06/2017 1536   Lab Results  Component Value Date   CHOL 204 (H) 08/13/2017   HDL 36 (L) 08/13/2017   LDLCALC 115 (H) 08/13/2017   TRIG 264 (H) 08/13/2017   CHOLHDL 5.7 (H) 08/13/2017   Lab Results  Component Value Date   HGBA1C 5.2 02/06/2017   Lab Results  Component Value Date   VITAMINB12 357 03/09/2014   Lab Results  Component Value Date   TSH 0.730 03/09/2014      ASSESSMENT AND PLAN 52 y.o. year old female  has a past medical history of Atypical chest pain, Barrett esophagus, Headache(784.0) (03/09/2014), Hiatal hernia, Hyperlipidemia, Hypertension, Osteopenia, Seizures ([place]) (09/27/2012), and Sleep disorder (09/27/2012). here with:  1.  Seizures 2.  Depression/anxiety   Overall the patient has remained stable.  She will continue on Xanax.  She will restart Zoloft taking 50 mg daily.  Advised that if her symptoms worsen or she develops new symptoms she should let us know.  She will follow-up in 6 months or sooner if needed.  I spent 15 minutes with the patient. 50% of this time was spent discussing her medication   Ward Givens, MSN, NP-C 09/10/2017, 2:16 PM [facility] [address], [place],  [address] [phone]

## 2017-09-11 DIAGNOSIS — M4722 Other spondylosis with radiculopathy, cervical region: Secondary | ICD-10-CM | POA: Diagnosis not present

## 2017-09-11 DIAGNOSIS — M961 Postlaminectomy syndrome, not elsewhere classified: Secondary | ICD-10-CM | POA: Diagnosis not present

## 2017-09-11 DIAGNOSIS — M5030 Other cervical disc degeneration, unspecified cervical region: Secondary | ICD-10-CM | POA: Diagnosis not present

## 2017-09-11 DIAGNOSIS — M5137 Other intervertebral disc degeneration, lumbosacral region: Secondary | ICD-10-CM | POA: Diagnosis not present

## 2017-09-21 DIAGNOSIS — E559 Vitamin D deficiency, unspecified: Secondary | ICD-10-CM | POA: Diagnosis not present

## 2017-09-21 DIAGNOSIS — M810 Age-related osteoporosis without current pathological fracture: Secondary | ICD-10-CM | POA: Diagnosis not present

## 2017-09-21 DIAGNOSIS — Z0000 Encounter for general adult medical examination without abnormal findings: Secondary | ICD-10-CM | POA: Diagnosis not present

## 2017-09-21 DIAGNOSIS — Z7189 Other specified counseling: Secondary | ICD-10-CM | POA: Diagnosis not present

## 2017-09-21 DIAGNOSIS — I10 Essential (primary) hypertension: Secondary | ICD-10-CM | POA: Diagnosis not present

## 2017-09-21 DIAGNOSIS — Z1339 Encounter for screening examination for other mental health and behavioral disorders: Secondary | ICD-10-CM | POA: Diagnosis not present

## 2017-09-21 DIAGNOSIS — Z6827 Body mass index (BMI) 27.0-27.9, adult: Secondary | ICD-10-CM | POA: Diagnosis not present

## 2017-10-03 ENCOUNTER — Other Ambulatory Visit: Payer: Self-pay | Admitting: Neurology

## 2017-10-04 DIAGNOSIS — N393 Stress incontinence (female) (male): Secondary | ICD-10-CM | POA: Diagnosis not present

## 2017-10-19 DIAGNOSIS — L532 Erythema marginatum: Secondary | ICD-10-CM | POA: Diagnosis not present

## 2017-10-19 DIAGNOSIS — Z6826 Body mass index (BMI) 26.0-26.9, adult: Secondary | ICD-10-CM | POA: Diagnosis not present

## 2017-10-19 DIAGNOSIS — W57XXXA Bitten or stung by nonvenomous insect and other nonvenomous arthropods, initial encounter: Secondary | ICD-10-CM | POA: Diagnosis not present

## 2017-10-19 DIAGNOSIS — K219 Gastro-esophageal reflux disease without esophagitis: Secondary | ICD-10-CM | POA: Diagnosis not present

## 2017-10-19 DIAGNOSIS — R21 Rash and other nonspecific skin eruption: Secondary | ICD-10-CM | POA: Diagnosis not present

## 2017-11-06 DIAGNOSIS — M5137 Other intervertebral disc degeneration, lumbosacral region: Secondary | ICD-10-CM | POA: Diagnosis not present

## 2017-11-06 DIAGNOSIS — M961 Postlaminectomy syndrome, not elsewhere classified: Secondary | ICD-10-CM | POA: Diagnosis not present

## 2017-11-06 DIAGNOSIS — M4722 Other spondylosis with radiculopathy, cervical region: Secondary | ICD-10-CM | POA: Diagnosis not present

## 2017-11-08 DIAGNOSIS — R002 Palpitations: Secondary | ICD-10-CM | POA: Diagnosis not present

## 2017-11-08 DIAGNOSIS — R0609 Other forms of dyspnea: Secondary | ICD-10-CM | POA: Diagnosis not present

## 2017-11-08 DIAGNOSIS — Z6826 Body mass index (BMI) 26.0-26.9, adult: Secondary | ICD-10-CM | POA: Diagnosis not present

## 2017-11-16 ENCOUNTER — Ambulatory Visit: Payer: Medicare HMO | Admitting: Cardiology

## 2017-11-19 DIAGNOSIS — M810 Age-related osteoporosis without current pathological fracture: Secondary | ICD-10-CM | POA: Diagnosis not present

## 2017-11-19 DIAGNOSIS — Z780 Asymptomatic menopausal state: Secondary | ICD-10-CM | POA: Diagnosis not present

## 2017-11-19 DIAGNOSIS — M85852 Other specified disorders of bone density and structure, left thigh: Secondary | ICD-10-CM | POA: Diagnosis not present

## 2017-11-20 ENCOUNTER — Encounter: Payer: Self-pay | Admitting: Cardiology

## 2017-11-20 ENCOUNTER — Ambulatory Visit: Payer: Medicare HMO | Admitting: Cardiology

## 2017-11-20 VITALS — BP 134/78 | HR 101 | Ht 61.0 in | Wt 143.0 lb

## 2017-11-20 DIAGNOSIS — R0789 Other chest pain: Secondary | ICD-10-CM

## 2017-11-20 DIAGNOSIS — E785 Hyperlipidemia, unspecified: Secondary | ICD-10-CM

## 2017-11-20 DIAGNOSIS — IMO0001 Reserved for inherently not codable concepts without codable children: Secondary | ICD-10-CM | POA: Insufficient documentation

## 2017-11-20 DIAGNOSIS — I10 Essential (primary) hypertension: Secondary | ICD-10-CM | POA: Insufficient documentation

## 2017-11-20 DIAGNOSIS — F17200 Nicotine dependence, unspecified, uncomplicated: Secondary | ICD-10-CM | POA: Diagnosis not present

## 2017-11-20 HISTORY — DX: Nicotine dependence, unspecified, uncomplicated: F17.200

## 2017-11-20 HISTORY — DX: Essential (primary) hypertension: I10

## 2017-11-20 HISTORY — DX: Reserved for inherently not codable concepts without codable children: IMO0001

## 2017-11-20 NOTE — Patient Instructions (Signed)
Medication Instructions:  Your physician recommends that you continue on your current medications as directed. Please refer to the Current Medication list given to you today.   Labwork: None  Testing/Procedures: You had an EKG today.  Your physician has requested that you have an echocardiogram. Echocardiography is a painless test that uses sound waves to create images of your heart. It provides your doctor with information about the size and shape of your heart and how well your heart's chambers and valves are working. This procedure takes approximately one hour. There are no restrictions for this procedure.  Your physician has recommended that you wear a holter monitor. Holter monitors are medical devices that record the heart's electrical activity. Doctors most often use these monitors to diagnose arrhythmias. Arrhythmias are problems with the speed or rhythm of the heartbeat. The monitor is a small, portable device. You can wear one while you do your normal daily activities. This is usually used to diagnose what is causing palpitations/syncope (passing out). Wear for 48 hours.     Follow-Up: Your physician wants you to follow-up in: 3 months. You will receive a reminder letter in the mail two months in advance. If you don't receive a letter, please call our office to schedule the follow-up appointment.   If you need a refill on your cardiac medications before your next appointment, please call your pharmacy.   Thank you for choosing [facility]! Robyne Peers, RN [phone]

## 2017-11-20 NOTE — Progress Notes (Signed)
Cardiology Office Note:    Date:  11/20/2017   ID:  RHEALYN CULLEN, DOB [birth year], MRN [id number]  PCP:  Myer Peer, MD  Cardiologist:  Jenne Campus, MD    Referring MD: Myer Peer, MD   Chief Complaint  Patient presents with  . Follow-up  I have palpitations and fast heartbeats  History of Present Illness:    Crystal Lyons is a 52 y.o. female with hypertension, seizure disorder, history of atypical chest pain negative CAD work-up, dyslipidemia, smoking presented today to our office because a few times when she visited different 42 office she was find to be tachycardic with heart rate up to 130.  I have EKG from her primary care physician which showed normal sinus rhythm with rate less than 100 today's EKG also show normal sinus rhythm rate 98 poor R wave progression anterior precordium.  She denies have any chest pain tightness squeezing pressure burning chest.  She does have numerous pain in different portions of her body which is a chronic problem but the main area of pain is her back.  She try to help her mother works in the garden some usually gets short of breath she uses some albuterol for it.  She says she uses albuterol fairly rarely.  Sadly still continue to smoke  Past Medical History:  Diagnosis Date  . Atypical chest pain   . Barrett esophagus   . Headache(784.0) 03/09/2014  . Hiatal hernia   . Hyperlipidemia   . Hypertension   . Osteopenia   . Seizures ([place]) 09/27/2012  . Sleep disorder 09/27/2012    Past Surgical History:  Procedure Laterality Date  . BRAIN SURGERY  01-06-85  . ears    . EYE SURGERY     cyst removed  . GALLBLADDER SURGERY N/A 2009  . NECK SURGERY    . SHOULDER SURGERY Bilateral 2009, 2010    Current Medications: Current Meds  Medication Sig  . ALPRAZolam (XANAX) 0.5 MG tablet TAKE 1 TABLET BY MOUTH 3 TIMES DAILY AS NEEDED  . Aspirin-Acetaminophen-Caffeine (GOODYS EXTRA STRENGTH PO) Take 1 Package by mouth as needed.    . fluticasone (FLONASE) 50 MCG/ACT nasal spray Place 2 sprays into both nostrils daily.  . fluticasone (FLOVENT DISKUS) 50 MCG/BLIST diskus inhaler Inhale into the lungs.  Marland Kitchen gentamicin (GARAMYCIN) 0.3 % ophthalmic solution   . Homeopathic Products (SIMILASAN DRY EYE RELIEF OP) Apply 1-2 drops to eye daily.  Marland Kitchen HYDROcodone-acetaminophen (NORCO/VICODIN) 5-325 MG per tablet Take 1 tablet by mouth 4 (four) times daily.   . hydrOXYzine (ATARAX/VISTARIL) 25 MG tablet Take 25 mg by mouth as needed (at bedtime).  . lansoprazole (PREVACID) 30 MG capsule Take 30 mg by mouth daily.   Marland Kitchen NASONEX 50 MCG/ACT nasal spray Place 2 sprays into the nose daily.   Marland Kitchen neomycin-polymyxin b-dexamethasone (MAXITROL) 3.5-10000-0.1 SUSP Place 1 drop into both eyes 4 (four) times daily.  . pantoprazole (PROTONIX) 40 MG tablet Take 2 tablets by mouth daily.  Marland Kitchen Phenylephrine-Acetaminophen (TYLENOL SINUS CONGESTION/PAIN PO) Take by mouth.  . sertraline (ZOLOFT) 100 MG tablet Take 1 tablet (100 mg total) by mouth daily. (Patient taking differently: Take 50 mg by mouth daily. )  . spironolactone-hydrochlorothiazide (ALDACTAZIDE) 25-25 MG tablet Take 1 tablet by mouth daily.  Marland Kitchen tiotropium (SPIRIVA HANDIHALER) 18 MCG inhalation capsule   . tiZANidine (ZANAFLEX) 4 MG tablet Take 4 mg by mouth as needed.   . triamcinolone cream (KENALOG) 0.1 % Apply topically 2 (two) times daily.  Marland Kitchen  VIVELLE-DOT 0.05 MG/24HR Place 1 patch onto the skin 2 (two) times a week.   Marland Kitchen ZETIA 10 MG tablet Take 10 mg by mouth daily.      Allergies:   Adenosine; Aleve [naproxen sodium]; Feldene [piroxicam]; Keflex [cephalexin]; Mobic [meloxicam]; Neurontin [gabapentin]; Other; Penicillins; and Vioxx [rofecoxib]   Social History   Socioeconomic History  . Marital status: Single    Spouse name: Not on file  . Number of children: 0  . Years of education: 8  . Highest education level: Not on file  Occupational History  . Occupation: Disabled  Social  Needs  . Financial resource strain: Not on file  . Food insecurity:    Worry: Not on file    Inability: Not on file  . Transportation needs:    Medical: Not on file    Non-medical: Not on file  Tobacco Use  . Smoking status: Current Every Day Smoker    Packs/day: 1.50    Types: Cigarettes  . Smokeless tobacco: Never Used  Substance and Sexual Activity  . Alcohol use: No  . Drug use: No  . Sexual activity: Not on file  Lifestyle  . Physical activity:    Days per week: Not on file    Minutes per session: Not on file  . Stress: Not on file  Relationships  . Social connections:    Talks on phone: Not on file    Gets together: Not on file    Attends religious service: Not on file    Active member of club or organization: Not on file    Attends meetings of clubs or organizations: Not on file    Relationship status: Not on file  Other Topics Concern  . Not on file  Social History Narrative   Patient lives at home mother    Patient has no children.    Patient is single.    Patient has a 8th grade education.    Patient is disabled           Family History: The patient's family history includes Cancer in her father and mother; Cirrhosis in her sister; Diabetes in her father; Heart failure in her father; Hypertension in her father and mother; Rheum arthritis in her mother; Stroke in her mother; Thyroid disease in her mother. ROS:   Please see the history of present illness.    All 14 point review of systems negative except as described per history of present illness  EKGs/Labs/Other Studies Reviewed:      Recent Labs: 02/06/2017: BUN 9; Creatinine, Ser 0.81; Potassium 4.4; Sodium 140  Recent Lipid Panel    Component Value Date/Time   CHOL 204 (H) 08/13/2017 1517   TRIG 264 (H) 08/13/2017 1517   HDL 36 (L) 08/13/2017 1517   CHOLHDL 5.7 (H) 08/13/2017 1517   LDLCALC 115 (H) 08/13/2017 1517    Physical Exam:    VS:  BP 134/78   Pulse (!) 101   Ht 5\' 1"  (1.549 m)    Wt 143 lb (64.9 kg)   SpO2 96%   BMI 27.02 kg/m     Wt Readings from Last 3 Encounters:  11/20/17 143 lb (64.9 kg)  09/10/17 147 lb 6.4 oz (66.9 kg)  08/13/17 150 lb 12.8 oz (68.4 kg)     GEN:  Well nourished, well developed in no acute distress HEENT: Normal NECK: No JVD; No carotid bruits LYMPHATICS: No lymphadenopathy CARDIAC: RRR, no murmurs, no rubs, no gallops RESPIRATORY:  Clear to auscultation  without rales, wheezing or rhonchi  ABDOMEN: Soft, non-tender, non-distended MUSCULOSKELETAL:  No edema; No deformity  SKIN: Warm and dry LOWER EXTREMITIES: no swelling NEUROLOGIC:  Alert and oriented x 3 PSYCHIATRIC:  Normal affect   ASSESSMENT:    1. Atypical chest pain   2. Dyslipidemia   3. Essential hypertension   4. Smoking    PLAN:    In order of problems listed above:  1. Atypical chest pain: Denies having any 2. Tachycardia which appears to be sinus tachycardia at least on the EKG did have a chance to see.  I will ask her to have echocardiogram to assess left ventricular ejection fraction.  I will ask her also to wear 48 hours Holter monitor to see if there is any abnormal rhythm.  Will do pulse oximetry today make her walk around and see behavior of the heart rate as well as pulse oximetry. 3. Essential hypertension.  Blood pressure controlled continue present management. 4. Smoking again she was told she must quit. 5. I reviewed her laboratory tests from primary care physician she is not anemic and her TSH is normal.  She does not drink any caffeinated drinks anymore.   Medication Adjustments/Labs and Tests Ordered: Current medicines are reviewed at length with the patient today.  Concerns regarding medicines are outlined above.  No orders of the defined types were placed in this encounter.  Medication changes: No orders of the defined types were placed in this encounter.   Signed, Park Liter, MD, [facility] 11/20/2017 3:42 PM    [place]

## 2017-12-04 DIAGNOSIS — H5203 Hypermetropia, bilateral: Secondary | ICD-10-CM | POA: Diagnosis not present

## 2017-12-13 ENCOUNTER — Ambulatory Visit (HOSPITAL_BASED_OUTPATIENT_CLINIC_OR_DEPARTMENT_OTHER)
Admission: RE | Admit: 2017-12-13 | Discharge: 2017-12-13 | Disposition: A | Discharge status: Home / Self Care | Payer: Medicare HMO | Source: Ambulatory Visit | Attending: Family Medicine | Admitting: Family Medicine

## 2017-12-13 ENCOUNTER — Ambulatory Visit: Payer: Medicare HMO

## 2017-12-13 DIAGNOSIS — Z720 Tobacco use: Secondary | ICD-10-CM | POA: Insufficient documentation

## 2017-12-13 DIAGNOSIS — I10 Essential (primary) hypertension: Secondary | ICD-10-CM | POA: Diagnosis not present

## 2017-12-13 DIAGNOSIS — R002 Palpitations: Secondary | ICD-10-CM | POA: Diagnosis not present

## 2017-12-13 DIAGNOSIS — R0789 Other chest pain: Secondary | ICD-10-CM | POA: Diagnosis not present

## 2017-12-13 DIAGNOSIS — E785 Hyperlipidemia, unspecified: Secondary | ICD-10-CM | POA: Insufficient documentation

## 2017-12-13 DIAGNOSIS — R000 Tachycardia, unspecified: Secondary | ICD-10-CM | POA: Diagnosis not present

## 2017-12-13 NOTE — Progress Notes (Signed)
  Echocardiogram 2D Echocardiogram has been performed.  Joelene Millin 12/13/2017, 11:44 AM

## 2017-12-21 ENCOUNTER — Encounter (INDEPENDENT_AMBULATORY_CARE_PROVIDER_SITE_OTHER): Payer: Self-pay

## 2018-01-02 ENCOUNTER — Encounter (INDEPENDENT_AMBULATORY_CARE_PROVIDER_SITE_OTHER): Payer: Self-pay

## 2018-01-07 DIAGNOSIS — M961 Postlaminectomy syndrome, not elsewhere classified: Secondary | ICD-10-CM | POA: Diagnosis not present

## 2018-01-07 DIAGNOSIS — M4722 Other spondylosis with radiculopathy, cervical region: Secondary | ICD-10-CM | POA: Diagnosis not present

## 2018-01-07 DIAGNOSIS — M5137 Other intervertebral disc degeneration, lumbosacral region: Secondary | ICD-10-CM | POA: Diagnosis not present

## 2018-01-07 DIAGNOSIS — M5030 Other cervical disc degeneration, unspecified cervical region: Secondary | ICD-10-CM | POA: Diagnosis not present

## 2018-02-15 DIAGNOSIS — K121 Other forms of stomatitis: Secondary | ICD-10-CM | POA: Diagnosis not present

## 2018-02-15 DIAGNOSIS — M5412 Radiculopathy, cervical region: Secondary | ICD-10-CM | POA: Diagnosis not present

## 2018-02-15 DIAGNOSIS — Z6826 Body mass index (BMI) 26.0-26.9, adult: Secondary | ICD-10-CM | POA: Diagnosis not present

## 2018-02-15 DIAGNOSIS — M67919 Unspecified disorder of synovium and tendon, unspecified shoulder: Secondary | ICD-10-CM | POA: Diagnosis not present

## 2018-02-25 DIAGNOSIS — L720 Epidermal cyst: Secondary | ICD-10-CM | POA: Diagnosis not present

## 2018-02-26 ENCOUNTER — Ambulatory Visit: Payer: Medicare HMO | Admitting: Cardiology

## 2018-03-11 DIAGNOSIS — M5137 Other intervertebral disc degeneration, lumbosacral region: Secondary | ICD-10-CM | POA: Diagnosis not present

## 2018-03-11 DIAGNOSIS — M961 Postlaminectomy syndrome, not elsewhere classified: Secondary | ICD-10-CM | POA: Diagnosis not present

## 2018-03-11 DIAGNOSIS — M5030 Other cervical disc degeneration, unspecified cervical region: Secondary | ICD-10-CM | POA: Diagnosis not present

## 2018-03-14 ENCOUNTER — Ambulatory Visit: Payer: Medicare HMO | Admitting: Neurology

## 2018-03-14 ENCOUNTER — Encounter: Payer: Self-pay | Admitting: Neurology

## 2018-03-14 VITALS — BP 98/60 | HR 94 | Ht 61.0 in | Wt 140.5 lb

## 2018-03-14 DIAGNOSIS — G40909 Epilepsy, unspecified, not intractable, without status epilepticus: Secondary | ICD-10-CM | POA: Diagnosis not present

## 2018-03-14 MED ORDER — BUSPIRONE HCL 5 MG PO TABS: 5.0000 mg | ORAL_TABLET | Freq: Three times a day (TID) | ORAL | 3 refills | Status: DC

## 2018-03-14 NOTE — Patient Instructions (Signed)
Start buspar 5 mg three times a day.

## 2018-03-14 NOTE — Progress Notes (Signed)
Reason for visit: Headache, anxiety  Crystal Lyons is an 52 y.o. female  History of present illness:  Crystal Lyons is a 52 year old right-handed white female with a history of seizures in the past, status post epilepsy surgery.  The patient is not on any anticonvulsant medications at this point and she has done well with this.  The patient continues to have frequent headaches, she has a lot of neck pain, and many of her headaches come up from the neck.  She has not yet decided that she wants to have MRI evaluation on her cervical spine.  She has had prior cervical spine surgery.  The patient has developed a lot of problems with anxiety and irritability.  The patient has gone off of her Zoloft as she felt that this was making her feel lethargic.  The patient has some left shoulder problems that she was told was related to a rotator cuff tear problem.  The patient is not sleeping well.  She has been on some stress as her mother has lung cancer.  Past Medical History:  Diagnosis Date  . Atypical chest pain   . Barrett esophagus   . Headache(784.0) 03/09/2014  . Hiatal hernia   . Hyperlipidemia   . Hypertension   . Osteopenia   . Seizures ([place]) 09/27/2012  . Sleep disorder 09/27/2012    Past Surgical History:  Procedure Laterality Date  . BRAIN SURGERY  01-06-85  . ears    . EYE SURGERY     cyst removed  . GALLBLADDER SURGERY N/A 2009  . NECK SURGERY    . SHOULDER SURGERY Bilateral 2009, 2010    Family History  Problem Relation Age of Onset  . Hypertension Father   . Cancer Father   . Diabetes Father   . Heart failure Father   . Cancer Mother   . Thyroid disease Mother   . Rheum arthritis Mother   . Stroke Mother   . Hypertension Mother   . Cirrhosis Sister     Social history:  reports that she has been smoking cigarettes. She has been smoking about 1.50 packs per day. She has never used smokeless tobacco. She reports that she does not drink alcohol or use drugs.      Allergies  Allergen Reactions  . Adenosine Anaphylaxis    Heart block  . Aleve [Naproxen Sodium]   . Feldene [Piroxicam]   . Keflex [Cephalexin]   . Mobic [Meloxicam]   . Neurontin [Gabapentin]   . Other     Any anti-inflammatory  . Penicillins   . Vioxx [Rofecoxib]     Medications:  Prior to Admission medications   Medication Sig Start Date End Date Taking? Authorizing Provider  ALPRAZolam Duanne Moron) 0.5 MG tablet TAKE 1 TABLET BY MOUTH 3 TIMES DAILY AS NEEDED 10/03/17  Yes Kathrynn Ducking, MD  Aspirin-Acetaminophen-Caffeine (GOODYS EXTRA STRENGTH PO) Take 1 Package by mouth as needed.   Yes [provider]  fluticasone (FLONASE) 50 MCG/ACT nasal spray Place 2 sprays into both nostrils daily.   Yes [provider]  fluticasone (FLOVENT DISKUS) 50 MCG/BLIST diskus inhaler Inhale into the lungs.   Yes [provider]  gentamicin (GARAMYCIN) 0.3 % ophthalmic solution  06/19/16  Yes [provider]  Homeopathic Products [facility] DRY EYE RELIEF OP) Apply 1-2 drops to eye daily.   Yes [provider]  HYDROcodone-acetaminophen (NORCO/VICODIN) 5-325 MG per tablet Take 1 tablet by mouth 4 (four) times daily.  09/19/12  Yes [provider]  hydrOXYzine (ATARAX/VISTARIL) 25 MG tablet Take 25 mg by mouth as needed (at bedtime).   Yes [provider]  lansoprazole (PREVACID) 30 MG capsule Take 30 mg by mouth daily.  09/19/12  Yes [provider]  mupirocin cream (BACTROBAN) 2 % Apply 1 application topically daily as needed (apply to incision).   Yes [provider]  NASONEX 50 MCG/ACT nasal spray Place 2 sprays into the nose daily.  09/19/12  Yes [provider]  neomycin-polymyxin b-dexamethasone (MAXITROL) 3.5-10000-0.1 SUSP Place 1 drop into both eyes 4 (four) times daily.   Yes [provider]  pantoprazole (PROTONIX) 40 MG tablet Take 2 tablets by mouth daily. 07/18/17  Yes [provider]   Phenylephrine-Acetaminophen (TYLENOL SINUS CONGESTION/PAIN PO) Take by mouth.   Yes [provider]  spironolactone-hydrochlorothiazide (ALDACTAZIDE) 25-25 MG tablet Take 1 tablet by mouth daily. 10/22/17  Yes [provider]  tiotropium (SPIRIVA HANDIHALER) 18 MCG inhalation capsule  08/17/17  Yes [provider]  tiZANidine (ZANAFLEX) 4 MG tablet Take 4 mg by mouth as needed.  01/17/13  Yes [provider]  triamcinolone (KENALOG) 0.1 % paste  02/15/18  Yes [provider]  triamcinolone cream (KENALOG) 0.1 % Apply topically 2 (two) times daily. 05/30/17  Yes [provider]  VIVELLE-DOT 0.05 MG/24HR Place 1 patch onto the skin 2 (two) times a week.  09/19/12  Yes [provider]  ZETIA 10 MG tablet Take 10 mg by mouth daily.  01/21/16  Yes [provider]  busPIRone (BUSPAR) 5 MG tablet Take 1 tablet (5 mg total) by mouth 3 (three) times daily. 03/14/18   Kathrynn Ducking, MD  promethazine (PHENERGAN) 12.5 MG tablet Take 12.5 mg by mouth daily as needed. 11/30/15 09/10/17  [provider]    ROS:  Out of a complete 14 system review of symptoms, the patient complains only of the following symptoms, and all other reviewed systems are negative.  Chills, fatigue Ear pain, ringing in the ears Eye itching Cough, wheezing, chest tightness Chest pain, leg swelling, heart murmur Nausea Sleep apnea, frequent waking, daytime sleepiness, snoring Joint swelling, back pain, neck pain, neck stiffness Itching Dizziness, headache, seizures Confusion Hyperactivity  Blood pressure 98/60, pulse 94, height 5\' 1"  (1.549 m), weight 140 lb 8 oz (63.7 kg).  Physical Exam  General: The patient is alert and cooperative at the time of the examination.  Skin: No significant peripheral edema is noted.   Neurologic Exam  Mental status: The patient is alert and oriented x 3 at the time of the examination. The patient has apparent normal  recent and remote memory, with an apparently normal attention span and concentration ability.   Cranial nerves: Facial symmetry is present. Speech is normal, no aphasia or dysarthria is noted. Extraocular movements are full. Visual fields are full.  Motor: The patient has good strength in all 4 extremities.  Sensory examination: Soft touch sensation is symmetric on the face, arms, and legs.  Coordination: The patient has good finger-nose-finger and heel-to-shin bilaterally.  Gait and station: The patient has a normal gait. Tandem gait is normal. Romberg is negative. No drift is seen.  Reflexes: Deep tendon reflexes are symmetric.   Assessment/Plan:  1.  Chronic daily headache  2.  Anxiety  The patient is having increasing problems with irritability and anxiety off of Zoloft, she could not tolerate the medication well.  She will be tried on low-dose BuSpar, she will call for  any dose adjustments.  She will follow-up in 6 to 8 months.  Many of her headaches may be related to the cervical spine issues.  Jill Alexanders MD 03/14/2018 12:59 PM  [facility] [address] [place], [place] [address]  Phone [phone] Fax [phone]

## 2018-03-18 ENCOUNTER — Ambulatory Visit (INDEPENDENT_AMBULATORY_CARE_PROVIDER_SITE_OTHER): Payer: Medicare HMO | Admitting: Cardiology

## 2018-03-18 ENCOUNTER — Encounter: Payer: Self-pay | Admitting: Cardiology

## 2018-03-18 VITALS — BP 110/70 | HR 105 | Ht 61.0 in | Wt 140.0 lb

## 2018-03-18 DIAGNOSIS — F17200 Nicotine dependence, unspecified, uncomplicated: Secondary | ICD-10-CM

## 2018-03-18 DIAGNOSIS — I10 Essential (primary) hypertension: Secondary | ICD-10-CM | POA: Diagnosis not present

## 2018-03-18 DIAGNOSIS — E7800 Pure hypercholesterolemia, unspecified: Secondary | ICD-10-CM | POA: Diagnosis not present

## 2018-03-18 DIAGNOSIS — Z131 Encounter for screening for diabetes mellitus: Secondary | ICD-10-CM | POA: Diagnosis not present

## 2018-03-18 DIAGNOSIS — R0789 Other chest pain: Secondary | ICD-10-CM | POA: Diagnosis not present

## 2018-03-18 NOTE — Progress Notes (Signed)
Cardiology Office Note:    Date:  03/18/2018   ID:  Crystal Lyons, DOB 1966/02/07, MRN [id number]  PCP:  Myer Peer, MD  Cardiologist:  Jenne Campus, MD    Referring MD: Myer Peer, MD   Chief Complaint  Patient presents with  . Follow-up  Doing well  History of Present Illness:    Crystal Lyons is a 52 y.o. female with history of COPD, hypertension, chronic smoker.  Recent to be investigated the issue of palpitations/tachycardia she wear Holter monitor which showed average heart rate of 88.  No critical finding identified.  She also had echocardiogram done which showed preserved left ventricular ejection fraction.  Past Medical History:  Diagnosis Date  . Atypical chest pain   . Barrett esophagus   . Headache(784.0) 03/09/2014  . Hiatal hernia   . Hyperlipidemia   . Hypertension   . Osteopenia   . Seizures ([place]) 09/27/2012  . Sleep disorder 09/27/2012    Past Surgical History:  Procedure Laterality Date  . BRAIN SURGERY  01-06-85  . ears    . EYE SURGERY     cyst removed  . GALLBLADDER SURGERY N/A 2009  . NECK SURGERY    . SHOULDER SURGERY Bilateral 2009, 2010    Current Medications: Current Meds  Medication Sig  . ALPRAZolam (XANAX) 0.5 MG tablet TAKE 1 TABLET BY MOUTH 3 TIMES DAILY AS NEEDED  . Aspirin-Acetaminophen-Caffeine (GOODYS EXTRA STRENGTH PO) Take 1 Package by mouth as needed.  . fluticasone (FLONASE) 50 MCG/ACT nasal spray Place 2 sprays into both nostrils daily.  . fluticasone (FLOVENT DISKUS) 50 MCG/BLIST diskus inhaler Inhale into the lungs.  Marland Kitchen gentamicin (GARAMYCIN) 0.3 % ophthalmic solution   . Homeopathic Products (SIMILASAN DRY EYE RELIEF OP) Apply 1-2 drops to eye daily.  Marland Kitchen HYDROcodone-acetaminophen (NORCO/VICODIN) 5-325 MG per tablet Take 1 tablet by mouth 4 (four) times daily.   . hydrOXYzine (ATARAX/VISTARIL) 25 MG tablet Take 25 mg by mouth as needed (at bedtime).  . lansoprazole (PREVACID) 30 MG capsule Take 30 mg by mouth  daily.   . mupirocin cream (BACTROBAN) 2 % Apply 1 application topically daily as needed (apply to incision).  . NASONEX 50 MCG/ACT nasal spray Place 2 sprays into the nose daily.   Marland Kitchen neomycin-polymyxin b-dexamethasone (MAXITROL) 3.5-10000-0.1 SUSP Place 1 drop into both eyes 4 (four) times daily.  . pantoprazole (PROTONIX) 40 MG tablet Take 2 tablets by mouth daily.  Marland Kitchen Phenylephrine-Acetaminophen (TYLENOL SINUS CONGESTION/PAIN PO) Take by mouth.  . spironolactone-hydrochlorothiazide (ALDACTAZIDE) 25-25 MG tablet Take 1 tablet by mouth daily.  Marland Kitchen tiotropium (SPIRIVA HANDIHALER) 18 MCG inhalation capsule   . tiZANidine (ZANAFLEX) 4 MG tablet Take 4 mg by mouth as needed.   . triamcinolone (KENALOG) 0.1 % paste   . triamcinolone cream (KENALOG) 0.1 % Apply topically 2 (two) times daily.  Marland Kitchen VIVELLE-DOT 0.05 MG/24HR Place 1 patch onto the skin 2 (two) times a week.   Marland Kitchen ZETIA 10 MG tablet Take 10 mg by mouth daily.      Allergies:   Adenosine; Aleve [naproxen sodium]; Feldene [piroxicam]; Keflex [cephalexin]; Mobic [meloxicam]; Neurontin [gabapentin]; Other; Penicillins; and Vioxx [rofecoxib]   Social History   Socioeconomic History  . Marital status: Single    Spouse name: Not on file  . Number of children: 0  . Years of education: 8  . Highest education level: Not on file  Occupational History  . Occupation: Disabled  Social Needs  . Financial resource strain: Not  on file  . Food insecurity:    Worry: Not on file    Inability: Not on file  . Transportation needs:    Medical: Not on file    Non-medical: Not on file  Tobacco Use  . Smoking status: Current Every Day Smoker    Packs/day: 1.50    Types: Cigarettes  . Smokeless tobacco: Never Used  Substance and Sexual Activity  . Alcohol use: No  . Drug use: No  . Sexual activity: Not on file  Lifestyle  . Physical activity:    Days per week: Not on file    Minutes per session: Not on file  . Stress: Not on file  Relationships   . Social connections:    Talks on phone: Not on file    Gets together: Not on file    Attends religious service: Not on file    Active member of club or organization: Not on file    Attends meetings of clubs or organizations: Not on file    Relationship status: Not on file  Other Topics Concern  . Not on file  Social History Narrative   Patient lives at home mother    Patient has no children.    Patient is single.    Patient has a 8th grade education.    Patient is disabled           Family History: The patient's family history includes Cancer in her father and mother; Cirrhosis in her sister; Diabetes in her father; Heart failure in her father; Hypertension in her father and mother; Rheum arthritis in her mother; Stroke in her mother; Thyroid disease in her mother. ROS:   Please see the history of present illness.    All 14 point review of systems negative except as described per history of present illness  EKGs/Labs/Other Studies Reviewed:      Recent Labs: No results found for requested labs within last 8760 hours.  Recent Lipid Panel    Component Value Date/Time   CHOL 204 (H) 08/13/2017 1517   TRIG 264 (H) 08/13/2017 1517   HDL 36 (L) 08/13/2017 1517   CHOLHDL 5.7 (H) 08/13/2017 1517   LDLCALC 115 (H) 08/13/2017 1517    Physical Exam:    VS:  BP 110/70   Pulse (!) 105   Ht 5\' 1"  (1.549 m)   Wt 140 lb (63.5 kg)   SpO2 95%   BMI 26.45 kg/m     Wt Readings from Last 3 Encounters:  03/18/18 140 lb (63.5 kg)  03/14/18 140 lb 8 oz (63.7 kg)  11/20/17 143 lb (64.9 kg)     GEN:  Well nourished, well developed in no acute distress HEENT: Normal NECK: No JVD; No carotid bruits LYMPHATICS: No lymphadenopathy CARDIAC: RRR, no murmurs, no rubs, no gallops RESPIRATORY:  Clear to auscultation without rales, wheezing or rhonchi  ABDOMEN: Soft, non-tender, non-distended MUSCULOSKELETAL:  No edema; No deformity  SKIN: Warm and dry LOWER EXTREMITIES: no  swelling NEUROLOGIC:  Alert and oriented x 3 PSYCHIATRIC:  Normal affect   ASSESSMENT:    1. Smoking   2. Hypercholesterolemia   3. Atypical chest pain   4. Essential hypertension    PLAN:    In order of problems listed above:  1. Smoking obviously a problem we had a long discussion about this and I strongly recommended to quit. 2. Dyslipidemia we will recheck her fasting lipid profile today. 3. Atypical chest pain denies having any. 4. Essential hypertension  blood pressure well controlled continue present management. 5. Tachycardia her average heart rate within 48 hours was 88 which is acceptable.  I suspect the reason for tachycardia is the fact that she does have chronic lung injury secondary to smoking which make her heart speed up while exercising.   Medication Adjustments/Labs and Tests Ordered: Current medicines are reviewed at length with the patient today.  Concerns regarding medicines are outlined above.  No orders of the defined types were placed in this encounter.  Medication changes: No orders of the defined types were placed in this encounter.   Signed, Park Liter, MD, [facility] 03/18/2018 4:07 PM    [facility]

## 2018-03-18 NOTE — Patient Instructions (Signed)
Medication Instructions:  Your physician recommends that you continue on your current medications as directed. Please refer to the Current Medication list given to you today.   Labwork: Your physician recommends that you return for lab work today: Lipids and hemoglobin a1c   Testing/Procedures: None.  Follow-Up: Your physician wants you to follow-up in: 5 months. You will receive a reminder letter in the mail two months in advance. If you don't receive a letter, please call our office to schedule the follow-up appointment.   Any Other Special Instructions Will Be Listed Below (If Applicable).     If you need a refill on your cardiac medications before your next appointment, please call your pharmacy.

## 2018-03-19 LAB — LIPID PANEL
Chol/HDL Ratio: 7.7 ratio — ABNORMAL HIGH (ref 0.0–4.4)
Cholesterol, Total: 245 mg/dL — ABNORMAL HIGH (ref 100–199)
HDL: 32 mg/dL — AB (ref >39)
LDL Calculated: 161 mg/dL — ABNORMAL HIGH (ref 0–99)
Triglycerides: 258 mg/dL — ABNORMAL HIGH (ref 0–149)
VLDL CHOLESTEROL CAL: 52 mg/dL — AB (ref 5–40)

## 2018-03-19 LAB — HEMOGLOBIN A1C
ESTIMATED AVERAGE GLUCOSE: 111 mg/dL
HEMOGLOBIN A1C: 5.5 % (ref 4.8–5.6)

## 2018-03-22 ENCOUNTER — Telehealth: Payer: Self-pay | Admitting: Emergency Medicine

## 2018-03-22 MED ORDER — PRAVASTATIN SODIUM 20 MG PO TABS: 20.0000 mg | ORAL_TABLET | Freq: Every evening | ORAL | 3 refills | Status: DC

## 2018-03-22 NOTE — Telephone Encounter (Signed)
Per Dr. Agustin Cree patient informed to start  pravastatin 20 mg daily. Patient verbally understands.

## 2018-04-01 DIAGNOSIS — Z23 Encounter for immunization: Secondary | ICD-10-CM | POA: Diagnosis not present

## 2018-04-01 DIAGNOSIS — Z6826 Body mass index (BMI) 26.0-26.9, adult: Secondary | ICD-10-CM | POA: Diagnosis not present

## 2018-04-01 DIAGNOSIS — H6090 Unspecified otitis externa, unspecified ear: Secondary | ICD-10-CM | POA: Diagnosis not present

## 2018-04-01 DIAGNOSIS — H6982 Other specified disorders of Eustachian tube, left ear: Secondary | ICD-10-CM | POA: Diagnosis not present

## 2018-04-03 ENCOUNTER — Other Ambulatory Visit: Payer: Self-pay | Admitting: Neurology

## 2018-04-08 DIAGNOSIS — Z7189 Other specified counseling: Secondary | ICD-10-CM | POA: Diagnosis not present

## 2018-04-08 DIAGNOSIS — F17210 Nicotine dependence, cigarettes, uncomplicated: Secondary | ICD-10-CM | POA: Diagnosis not present

## 2018-04-08 DIAGNOSIS — M810 Age-related osteoporosis without current pathological fracture: Secondary | ICD-10-CM | POA: Diagnosis not present

## 2018-04-08 DIAGNOSIS — E559 Vitamin D deficiency, unspecified: Secondary | ICD-10-CM | POA: Diagnosis not present

## 2018-04-12 ENCOUNTER — Telehealth: Payer: Self-pay | Admitting: Cardiology

## 2018-04-12 NOTE — Telephone Encounter (Signed)
Crystal Lyons called and stated she needed to tak to Dr Marthann Schiller nurse because the medication Dr Raliegh Ip recently put her on makes her knees swell to the point she can not walk.  She wants to stop taking it  Please call patient to discuss

## 2018-04-12 NOTE — Telephone Encounter (Signed)
Should not be related to Provastatin  Should continue provastatin

## 2018-04-12 NOTE — Telephone Encounter (Signed)
Patient is concerned about new medication pravastatin that we recently started her own. She reports that since starting the medication she has leg swelling and she also reports a heavy feeling in her chest sometimes when she sits down. Will inform Dr. Agustin Cree.

## 2018-04-15 NOTE — Telephone Encounter (Signed)
Left message for patient to return call.

## 2018-04-15 NOTE — Telephone Encounter (Signed)
Patient informed to continue taking pravastatin per Dr. Agustin Cree and notify if symptoms continue or get worse. Patient verbally understands.

## 2018-05-06 DIAGNOSIS — M5137 Other intervertebral disc degeneration, lumbosacral region: Secondary | ICD-10-CM | POA: Diagnosis not present

## 2018-05-06 DIAGNOSIS — M961 Postlaminectomy syndrome, not elsewhere classified: Secondary | ICD-10-CM | POA: Diagnosis not present

## 2018-05-06 DIAGNOSIS — M5030 Other cervical disc degeneration, unspecified cervical region: Secondary | ICD-10-CM | POA: Diagnosis not present

## 2018-05-06 DIAGNOSIS — M4722 Other spondylosis with radiculopathy, cervical region: Secondary | ICD-10-CM | POA: Diagnosis not present

## 2018-05-10 DIAGNOSIS — Z6826 Body mass index (BMI) 26.0-26.9, adult: Secondary | ICD-10-CM | POA: Diagnosis not present

## 2018-05-10 DIAGNOSIS — L080 Pyoderma: Secondary | ICD-10-CM | POA: Diagnosis not present

## 2018-05-13 ENCOUNTER — Other Ambulatory Visit: Payer: Self-pay | Admitting: Obstetrics and Gynecology

## 2018-05-13 DIAGNOSIS — Z1231 Encounter for screening mammogram for malignant neoplasm of breast: Secondary | ICD-10-CM

## 2018-05-28 DIAGNOSIS — N644 Mastodynia: Secondary | ICD-10-CM | POA: Diagnosis not present

## 2018-05-28 DIAGNOSIS — N9089 Other specified noninflammatory disorders of vulva and perineum: Secondary | ICD-10-CM | POA: Diagnosis not present

## 2018-05-28 DIAGNOSIS — N901 Moderate vulvar dysplasia: Secondary | ICD-10-CM | POA: Diagnosis not present

## 2018-05-31 ENCOUNTER — Other Ambulatory Visit: Payer: Self-pay | Admitting: Obstetrics and Gynecology

## 2018-05-31 DIAGNOSIS — Z6827 Body mass index (BMI) 27.0-27.9, adult: Secondary | ICD-10-CM | POA: Diagnosis not present

## 2018-05-31 DIAGNOSIS — H04301 Unspecified dacryocystitis of right lacrimal passage: Secondary | ICD-10-CM | POA: Diagnosis not present

## 2018-05-31 DIAGNOSIS — N644 Mastodynia: Secondary | ICD-10-CM

## 2018-06-14 DIAGNOSIS — Z6827 Body mass index (BMI) 27.0-27.9, adult: Secondary | ICD-10-CM | POA: Diagnosis not present

## 2018-06-14 DIAGNOSIS — B029 Zoster without complications: Secondary | ICD-10-CM | POA: Diagnosis not present

## 2018-06-17 ENCOUNTER — Other Ambulatory Visit: Payer: Medicare HMO

## 2018-06-24 ENCOUNTER — Other Ambulatory Visit: Payer: Medicare HMO

## 2018-06-24 ENCOUNTER — Inpatient Hospital Stay: Admission: RE | Admit: 2018-06-24 | Payer: Medicare HMO | Source: Ambulatory Visit

## 2018-06-27 ENCOUNTER — Other Ambulatory Visit: Payer: Self-pay | Admitting: Obstetrics and Gynecology

## 2018-06-27 NOTE — Pre-Procedure Instructions (Signed)
Crystal Lyons  06/27/2018      [place], [place] [address] Phone: [phone] Fax: [phone]    Your procedure is scheduled on January 16  [facility]  Call this number if you have problems the morning of surgery:  [phone]   Remember:  Do not eat or drink after midnight.   Take these medicines the morning of surgery with A SIP OF WATER  albuterol (PROVENTIL HFA;VENTOLIN HFA) if needed ALPRAZolam Duanne Moron)  HYDROcodone-acetaminophen (NORCO/VICODIN lansoprazole (PREVACID NASONEX pantoprazole (PROTONIX) Eye drops if needed  Bring all inhalers with you the morning of surgery  7 days prior to surgery STOP taking any Aspirin (unless otherwise instructed by your surgeon), Aleve, Naproxen, Ibuprofen, Motrin, Advil, Goody's, BC's, all herbal medications, fish oil, and all vitamins.     Do not wear jewelry, make-up or nail polish.  Do not wear lotions, powders, or perfumes, or deodorant.  Do not shave 48 hours prior to surgery.   Do not bring valuables to the hospital.  [facility] is not responsible for any belongings or valuables.  Contacts, dentures or bridgework may not be worn into surgery.  Leave your suitcase in the car.  After surgery it may be brought to your room.  For patients admitted to the hospital, discharge time will be determined by your treatment team.  Patients discharged the day of surgery will not be allowed to drive home.    Special instructions:   [place]- Preparing For Surgery  Before surgery, you can play an important role. Because skin is not sterile, your skin needs to be as free of germs as possible. You can reduce the number of germs on your skin by washing with CHG (chlorahexidine gluconate) Soap before surgery.  CHG is an antiseptic cleaner which kills germs and bonds with the skin to continue killing germs even after washing.    Oral  Hygiene is also important to reduce your risk of infection.  Remember - BRUSH YOUR TEETH THE MORNING OF SURGERY WITH YOUR REGULAR TOOTHPASTE  Please do not use if you have an allergy to CHG or antibacterial soaps. If your skin becomes reddened/irritated stop using the CHG.  Do not shave (including legs and underarms) for at least 48 hours prior to first CHG shower. It is OK to shave your face.  Please follow these instructions carefully.   1. Shower the NIGHT BEFORE SURGERY and the MORNING OF SURGERY with CHG.   2. If you chose to wash your hair, wash your hair first as usual with your normal shampoo.  3. After you shampoo, rinse your hair and body thoroughly to remove the shampoo.  4. Use CHG as you would any other liquid soap. You can apply CHG directly to the skin and wash gently with a scrungie or a clean washcloth.   5. Apply the CHG Soap to your body ONLY FROM THE NECK DOWN.  Do not use on open wounds or open sores. Avoid contact with your eyes, ears, mouth and genitals (private parts). Wash Face and genitals (private parts)  with your normal soap.  6. Wash thoroughly, paying special attention to the area where your surgery will be performed.  7. Thoroughly rinse your body with warm water from the neck down.  8. DO NOT shower/wash with your normal soap after using and rinsing off the CHG Soap.  9. Pat yourself dry with  a CLEAN TOWEL.  10. Wear CLEAN PAJAMAS to bed the night before surgery, wear comfortable clothes the morning of surgery  11. Place CLEAN SHEETS on your bed the night of your first shower and DO NOT SLEEP WITH PETS.    Day of Surgery:  Do not apply any deodorants/lotions.  Please wear clean clothes to the hospital/surgery center.   Remember to brush your teeth WITH YOUR REGULAR TOOTHPASTE.    Please read over the following fact sheets that you were given.

## 2018-06-28 ENCOUNTER — Encounter (HOSPITAL_COMMUNITY)
Admission: RE | Admit: 2018-06-28 | Discharge: 2018-06-28 | Disposition: A | Discharge status: Home / Self Care | Payer: Medicare HMO | Source: Ambulatory Visit | Attending: Obstetrics and Gynecology | Admitting: Obstetrics and Gynecology

## 2018-06-28 ENCOUNTER — Encounter (HOSPITAL_COMMUNITY): Payer: Self-pay

## 2018-06-28 ENCOUNTER — Other Ambulatory Visit: Payer: Self-pay

## 2018-06-28 DIAGNOSIS — Z01812 Encounter for preprocedural laboratory examination: Secondary | ICD-10-CM | POA: Diagnosis not present

## 2018-06-28 HISTORY — DX: Anxiety disorder, unspecified: F41.9

## 2018-06-28 HISTORY — DX: Cardiac murmur, unspecified: R01.1

## 2018-06-28 HISTORY — DX: Carpal tunnel syndrome, unspecified upper limb: G56.00

## 2018-06-28 HISTORY — DX: Cardiac arrhythmia, unspecified: I49.9

## 2018-06-28 HISTORY — DX: Zoster without complications: B02.9

## 2018-06-28 HISTORY — DX: Gastro-esophageal reflux disease without esophagitis: K21.9

## 2018-06-28 HISTORY — DX: Sleep apnea, unspecified: G47.30

## 2018-06-28 LAB — CBC
HCT: 45.4 % (ref 36.0–46.0)
Hemoglobin: 15.1 g/dL — ABNORMAL HIGH (ref 12.0–15.0)
MCH: 33.7 pg (ref 26.0–34.0)
MCHC: 33.3 g/dL (ref 30.0–36.0)
MCV: 101.3 fL — ABNORMAL HIGH (ref 80.0–100.0)
Platelets: 333 10*3/uL (ref 150–400)
RBC: 4.48 MIL/uL (ref 3.87–5.11)
RDW: 13.4 % (ref 11.5–15.5)
WBC: 9.3 10*3/uL (ref 4.0–10.5)
nRBC: 0 % (ref 0.0–0.2)

## 2018-06-28 LAB — BASIC METABOLIC PANEL
Anion gap: 12 (ref 5–15)
BUN: 10 mg/dL (ref 6–20)
CALCIUM: 9.2 mg/dL (ref 8.9–10.3)
CO2: 21 mmol/L — ABNORMAL LOW (ref 22–32)
CREATININE: 1.05 mg/dL — AB (ref 0.44–1.00)
Chloride: 105 mmol/L (ref 98–111)
Glucose, Bld: 83 mg/dL (ref 70–99)
Potassium: 4.6 mmol/L (ref 3.5–5.1)
Sodium: 138 mmol/L (ref 135–145)

## 2018-06-28 NOTE — Progress Notes (Addendum)
Anesthesia Chart Review:  Case:  [id number] Date/Time:  07/04/18 1215   Procedure:  WIDE EXCISION VULVECTOMY (N/A )   Anesthesia type:  Choice   Pre-op diagnosis:  VIN II   Location:  [facility] OR ROOM 7 / [facility]   Surgeon:  Vanessa Kick, MD      DISCUSSION: Patient is a 53 year old female scheduled for the above procedure.  History includes smoking, HTN, murmur, atypical chest pain, GERD, hiatal hernia, seizure disorder (s/p right temporal lobectomy with good improvement, no anticonvulsants), mild OSA (08/27/12), C6-7 ACDF ~ 2011.  She sees cardiologist Dr. Agustin Cree regularly. Echo and Holter monitor ~ 7 months ago unremarkable. Known history of atypical chest pains which he has not felt were consistent with cardiac etiology--none at her 02/2018 visit. HTN controlled. She remains on statin therapy. He did encourage smoking cessation, but she continues to smoke. She had neurology follow-up as well within the past six months.  If no acute changes then I would anticipate that she can proceed as planned.   VS: BP 122/78   Pulse 84   Temp 36.6 C   Resp 20   Ht 5' 1.5" (1.562 m)   Wt 66.1 kg   SpO2 100%   BMI 27.10 kg/m    PROVIDERS: Street, Sharon Mt, MD is listed as PCP [facility]) - Jenne Campus, MD is cardiologist. Last visit 03/18/18. He has been seeing her regularly since before 2016 for palpitations, atypical chest pain, fatigue, neck pain. Chest pain descriptions have been sometimes stabbing and not related to exertion. He has never thought they were typical for cardiac etiology. He has ordered a few echocardiograms over the years which have been unremarkable, last in June 2019. Holter monitor at that time also without significant findings.  Kathrynn Ducking, MD is neurologist. Last visit 03/14/18. 6-8 month follow-up recommended. - Pain Management is through [facility] (see Care Everywhere).    LABS: Labs reviewed: Acceptable for surgery. (all labs ordered are  listed, but only abnormal results are displayed)  Labs Reviewed  BASIC METABOLIC PANEL - Abnormal; Notable for the following components:      Result Value   CO2 21 (*)    Creatinine, Ser 1.05 (*)    All other components within normal limits  CBC - Abnormal; Notable for the following components:   Hemoglobin 15.1 (*)    MCV 101.3 (*)    All other components within normal limits    EKG: 11/20/17: NSR, possible LAE. Low voltage QRS. Cannot rule out anterior infarct (age undetermined).   CV: Echo 12/13/17: Study Conclusions - Left ventricle: The cavity size was normal. Systolic function was   normal. Wall motion was normal; there were no regional wall   motion abnormalities. Impressions: - 1. Left ventricular systolic function is preserved visually   estimated ejection fraction is 60 to 65%.  48 Hour Holter Monitor 12/13/17: Baseline rhythm: Sinus rhythm Minimum heart rate: 52 BPM.  Average heart rate: 88 BPM.  Maximal heart rate 153 BPM. Atrial arrhythmia: Total of 8 premature supraventricular beats Ventricular arrhythmia: Total of 68 premature ventricular beats Conduction abnormality: None Symptoms: None Conclusion:  Normal Holter monitor with very infrequent PVCs and APCs.  No sustained arrhythmias.   Past Medical History:  Diagnosis Date  . Anxiety   . Atypical chest pain   . Barrett esophagus   . Carpal tunnel syndrome    right and left wrists  . Dysrhythmia   . GERD (gastroesophageal  reflux disease)   . Headache(784.0) 03/09/2014  . Heart murmur   . Hiatal hernia   . Hyperlipidemia   . Hypertension   . Osteopenia   . Seizures ([place]) 09/27/2012  . Shingles   . Sleep apnea   . Sleep disorder 09/27/2012    Past Surgical History:  Procedure Laterality Date  . BRAIN SURGERY  01-06-85  . BRAIN SURGERY Right 1986   "right temporal lobe"  . CHOLECYSTECTOMY    . ears    . EYE SURGERY     cyst removed  . GALLBLADDER SURGERY N/A 2009  . NECK SURGERY    . SHOULDER  SURGERY Bilateral 2009, 2010    MEDICATIONS: . albuterol (PROVENTIL HFA;VENTOLIN HFA) 108 (90 Base) MCG/ACT inhaler  . ALPRAZolam (XANAX) 0.5 MG tablet  . HYDROcodone-acetaminophen (NORCO/VICODIN) 5-325 MG per tablet  . hydrOXYzine (ATARAX/VISTARIL) 25 MG tablet  . lansoprazole (PREVACID) 30 MG capsule  . mupirocin cream (BACTROBAN) 2 %  . NASONEX 50 MCG/ACT nasal spray  . pantoprazole (PROTONIX) 40 MG tablet  . Polyethyl Glycol-Propyl Glycol (SYSTANE OP)  . pravastatin (PRAVACHOL) 20 MG tablet  . promethazine (PHENERGAN) 12.5 MG tablet  . spironolactone-hydrochlorothiazide (ALDACTAZIDE) 25-25 MG tablet  . sulfacetamide-prednisolone (BLEPHAMIDE) 10-0.2 % ophthalmic suspension  . tiotropium (SPIRIVA HANDIHALER) 18 MCG inhalation capsule  . tiZANidine (ZANAFLEX) 4 MG tablet  . triamcinolone cream (KENALOG) 0.1 %  . VIVELLE-DOT 0.05 MG/24HR   No current facility-administered medications for this encounter.     Myra Gianotti, PA-C Surgical Short Stay/Anesthesiology [facility] Phone [phone] [facility] Phone [phone] 06/28/2018 5:38 PM

## 2018-06-28 NOTE — Anesthesia Preprocedure Evaluation (Addendum)
Anesthesia Evaluation  Patient identified by MRN, date of birth, ID band Patient awake    Reviewed: Allergy & Precautions, NPO status , Patient's Chart, lab work & pertinent test results  Airway Mallampati: II  TM Distance: >3 FB Neck ROM: Full    Dental  (+) Dental Advisory Given, Edentulous Upper, Missing, Poor Dentition, Partial Lower   Pulmonary sleep apnea , Current Smoker,    Pulmonary exam normal breath sounds clear to auscultation       Cardiovascular hypertension, Normal cardiovascular exam+ dysrhythmias + Valvular Problems/Murmurs  Rhythm:Regular Rate:Normal     Neuro/Psych negative neurological ROS  negative psych ROS   GI/Hepatic Neg liver ROS, hiatal hernia, GERD  ,  Endo/Other  negative endocrine ROS  Renal/GU negative Renal ROS     Musculoskeletal negative musculoskeletal ROS (+)   Abdominal   Peds  Hematology negative hematology ROS (+)   Anesthesia Other Findings   Reproductive/Obstetrics negative OB ROS                           Anesthesia Physical Anesthesia Plan  ASA: III  Anesthesia Plan: General   Post-op Pain Management:    Induction: Intravenous  PONV Risk Score and Plan: 4 or greater and Dexamethasone, Scopolamine patch - Pre-op, Midazolam, Treatment may vary due to age or medical condition and Ondansetron  Airway Management Planned: LMA  Additional Equipment: None  Intra-op Plan:   Post-operative Plan: Extubation in OR  Informed Consent: I have reviewed the patients History and Physical, chart, labs and discussed the procedure including the risks, benefits and alternatives for the proposed anesthesia with the patient or authorized representative who has indicated his/her understanding and acceptance.     Dental advisory given  Plan Discussed with: CRNA  Anesthesia Plan Comments: (PAT note written 06/28/2018 by Myra Gianotti, PA-C. )       Anesthesia Quick Evaluation

## 2018-07-01 ENCOUNTER — Ambulatory Visit: Payer: Medicare HMO

## 2018-07-02 ENCOUNTER — Ambulatory Visit
Admission: RE | Admit: 2018-07-02 | Discharge: 2018-07-02 | Disposition: A | Discharge status: Home / Self Care | Payer: Medicare HMO | Source: Ambulatory Visit | Attending: Obstetrics and Gynecology | Admitting: Obstetrics and Gynecology

## 2018-07-02 DIAGNOSIS — N644 Mastodynia: Secondary | ICD-10-CM

## 2018-07-02 DIAGNOSIS — R922 Inconclusive mammogram: Secondary | ICD-10-CM | POA: Diagnosis not present

## 2018-07-02 DIAGNOSIS — N6489 Other specified disorders of breast: Secondary | ICD-10-CM | POA: Diagnosis not present

## 2018-07-03 DIAGNOSIS — M4722 Other spondylosis with radiculopathy, cervical region: Secondary | ICD-10-CM | POA: Diagnosis not present

## 2018-07-03 DIAGNOSIS — M5137 Other intervertebral disc degeneration, lumbosacral region: Secondary | ICD-10-CM | POA: Diagnosis not present

## 2018-07-03 DIAGNOSIS — M961 Postlaminectomy syndrome, not elsewhere classified: Secondary | ICD-10-CM | POA: Diagnosis not present

## 2018-07-04 ENCOUNTER — Encounter (HOSPITAL_COMMUNITY)
Admission: RE | Disposition: A | Discharge status: Home / Self Care | Payer: Self-pay | Source: Home / Self Care | Attending: Obstetrics and Gynecology

## 2018-07-04 ENCOUNTER — Other Ambulatory Visit: Payer: Self-pay

## 2018-07-04 ENCOUNTER — Ambulatory Visit (HOSPITAL_COMMUNITY): Payer: Medicare HMO | Admitting: Vascular Surgery

## 2018-07-04 ENCOUNTER — Ambulatory Visit (HOSPITAL_COMMUNITY): Payer: Medicare HMO | Admitting: Anesthesiology

## 2018-07-04 ENCOUNTER — Ambulatory Visit (HOSPITAL_COMMUNITY)
Admission: RE | Admit: 2018-07-04 | Discharge: 2018-07-04 | Disposition: A | Discharge status: Home / Self Care | Payer: Medicare HMO | Attending: Obstetrics and Gynecology | Admitting: Obstetrics and Gynecology

## 2018-07-04 ENCOUNTER — Encounter (HOSPITAL_COMMUNITY): Payer: Self-pay

## 2018-07-04 DIAGNOSIS — R569 Unspecified convulsions: Secondary | ICD-10-CM | POA: Insufficient documentation

## 2018-07-04 DIAGNOSIS — N891 Moderate vaginal dysplasia: Secondary | ICD-10-CM | POA: Diagnosis not present

## 2018-07-04 DIAGNOSIS — Z8379 Family history of other diseases of the digestive system: Secondary | ICD-10-CM | POA: Diagnosis not present

## 2018-07-04 DIAGNOSIS — G4730 Sleep apnea, unspecified: Secondary | ICD-10-CM | POA: Insufficient documentation

## 2018-07-04 DIAGNOSIS — Z823 Family history of stroke: Secondary | ICD-10-CM | POA: Diagnosis not present

## 2018-07-04 DIAGNOSIS — Z79899 Other long term (current) drug therapy: Secondary | ICD-10-CM | POA: Insufficient documentation

## 2018-07-04 DIAGNOSIS — Z880 Allergy status to penicillin: Secondary | ICD-10-CM | POA: Insufficient documentation

## 2018-07-04 DIAGNOSIS — R011 Cardiac murmur, unspecified: Secondary | ICD-10-CM | POA: Insufficient documentation

## 2018-07-04 DIAGNOSIS — G5600 Carpal tunnel syndrome, unspecified upper limb: Secondary | ICD-10-CM | POA: Diagnosis not present

## 2018-07-04 DIAGNOSIS — K219 Gastro-esophageal reflux disease without esophagitis: Secondary | ICD-10-CM | POA: Insufficient documentation

## 2018-07-04 DIAGNOSIS — Z881 Allergy status to other antibiotic agents status: Secondary | ICD-10-CM | POA: Diagnosis not present

## 2018-07-04 DIAGNOSIS — M8580 Other specified disorders of bone density and structure, unspecified site: Secondary | ICD-10-CM | POA: Insufficient documentation

## 2018-07-04 DIAGNOSIS — K449 Diaphragmatic hernia without obstruction or gangrene: Secondary | ICD-10-CM | POA: Insufficient documentation

## 2018-07-04 DIAGNOSIS — Z8249 Family history of ischemic heart disease and other diseases of the circulatory system: Secondary | ICD-10-CM | POA: Insufficient documentation

## 2018-07-04 DIAGNOSIS — Z886 Allergy status to analgesic agent status: Secondary | ICD-10-CM | POA: Diagnosis not present

## 2018-07-04 DIAGNOSIS — Z8349 Family history of other endocrine, nutritional and metabolic diseases: Secondary | ICD-10-CM | POA: Diagnosis not present

## 2018-07-04 DIAGNOSIS — F419 Anxiety disorder, unspecified: Secondary | ICD-10-CM | POA: Diagnosis not present

## 2018-07-04 DIAGNOSIS — F17210 Nicotine dependence, cigarettes, uncomplicated: Secondary | ICD-10-CM | POA: Diagnosis not present

## 2018-07-04 DIAGNOSIS — E785 Hyperlipidemia, unspecified: Secondary | ICD-10-CM | POA: Diagnosis not present

## 2018-07-04 DIAGNOSIS — Z833 Family history of diabetes mellitus: Secondary | ICD-10-CM | POA: Diagnosis not present

## 2018-07-04 DIAGNOSIS — Z9049 Acquired absence of other specified parts of digestive tract: Secondary | ICD-10-CM | POA: Diagnosis not present

## 2018-07-04 DIAGNOSIS — Z7951 Long term (current) use of inhaled steroids: Secondary | ICD-10-CM | POA: Insufficient documentation

## 2018-07-04 DIAGNOSIS — Z8261 Family history of arthritis: Secondary | ICD-10-CM | POA: Diagnosis not present

## 2018-07-04 DIAGNOSIS — R51 Headache: Secondary | ICD-10-CM | POA: Diagnosis not present

## 2018-07-04 DIAGNOSIS — N901 Moderate vulvar dysplasia: Secondary | ICD-10-CM | POA: Diagnosis not present

## 2018-07-04 DIAGNOSIS — Z888 Allergy status to other drugs, medicaments and biological substances status: Secondary | ICD-10-CM | POA: Insufficient documentation

## 2018-07-04 DIAGNOSIS — Z91048 Other nonmedicinal substance allergy status: Secondary | ICD-10-CM | POA: Insufficient documentation

## 2018-07-04 DIAGNOSIS — Z809 Family history of malignant neoplasm, unspecified: Secondary | ICD-10-CM | POA: Insufficient documentation

## 2018-07-04 DIAGNOSIS — I10 Essential (primary) hypertension: Secondary | ICD-10-CM | POA: Diagnosis not present

## 2018-07-04 HISTORY — PX: VULVECTOMY: SHX1086

## 2018-07-04 LAB — TYPE AND SCREEN
ABO/RH(D): O POS
Antibody Screen: NEGATIVE

## 2018-07-04 LAB — ABO/RH: ABO/RH(D): O POS

## 2018-07-04 SURGERY — WIDE EXCISION VULVECTOMY
Anesthesia: General | Site: Vulva

## 2018-07-04 MED ORDER — ONDANSETRON HCL 4 MG/2ML IJ SOLN
INTRAMUSCULAR | Status: DC | PRN
  Administered 2018-07-04: 4 mg via INTRAVENOUS

## 2018-07-04 MED ORDER — FENTANYL CITRATE (PF) 100 MCG/2ML IJ SOLN
INTRAMUSCULAR | Status: AC
  Filled 2018-07-04: qty 2

## 2018-07-04 MED ORDER — OXYCODONE HCL 5 MG/5ML PO SOLN: 5.0000 mg | Freq: Once | ORAL | Status: AC | PRN

## 2018-07-04 MED ORDER — LACTATED RINGERS IV SOLN
INTRAVENOUS | Status: DC
  Administered 2018-07-04: 11:00:00 via INTRAVENOUS

## 2018-07-04 MED ORDER — LACTATED RINGERS IV SOLN
INTRAVENOUS | Status: DC
  Administered 2018-07-04: 12:00:00 via INTRAVENOUS

## 2018-07-04 MED ORDER — LIDOCAINE-EPINEPHRINE (PF) 1 %-1:200000 IJ SOLN
INTRAMUSCULAR | Status: DC | PRN
  Administered 2018-07-04: 7 mL

## 2018-07-04 MED ORDER — LIDOCAINE-EPINEPHRINE (PF) 1 %-1:200000 IJ SOLN
INTRAMUSCULAR | Status: AC
  Filled 2018-07-04: qty 30

## 2018-07-04 MED ORDER — FENTANYL CITRATE (PF) 100 MCG/2ML IJ SOLN
25.0000 ug | INTRAMUSCULAR | Status: DC | PRN
  Administered 2018-07-04: 50 ug via INTRAVENOUS

## 2018-07-04 MED ORDER — SCOPOLAMINE 1 MG/3DAYS TD PT72
MEDICATED_PATCH | TRANSDERMAL | Status: AC
  Administered 2018-07-04: 1.5 mg via TRANSDERMAL
  Filled 2018-07-04: qty 1

## 2018-07-04 MED ORDER — PROPOFOL 10 MG/ML IV BOLUS
INTRAVENOUS | Status: DC | PRN
  Administered 2018-07-04: 200 mg via INTRAVENOUS

## 2018-07-04 MED ORDER — MIDAZOLAM HCL 2 MG/2ML IJ SOLN
INTRAMUSCULAR | Status: DC | PRN
  Administered 2018-07-04: 2 mg via INTRAVENOUS

## 2018-07-04 MED ORDER — PROPOFOL 10 MG/ML IV BOLUS
INTRAVENOUS | Status: AC
  Filled 2018-07-04: qty 20

## 2018-07-04 MED ORDER — FENTANYL CITRATE (PF) 100 MCG/2ML IJ SOLN
INTRAMUSCULAR | Status: DC | PRN
  Administered 2018-07-04: 100 ug via INTRAVENOUS

## 2018-07-04 MED ORDER — DEXAMETHASONE SODIUM PHOSPHATE 10 MG/ML IJ SOLN
INTRAMUSCULAR | Status: DC | PRN
  Administered 2018-07-04: 4 mg via INTRAVENOUS

## 2018-07-04 MED ORDER — DEXAMETHASONE SODIUM PHOSPHATE 4 MG/ML IJ SOLN
INTRAMUSCULAR | Status: AC
  Filled 2018-07-04: qty 1

## 2018-07-04 MED ORDER — LIDOCAINE HCL (CARDIAC) PF 100 MG/5ML IV SOSY
PREFILLED_SYRINGE | INTRAVENOUS | Status: AC
  Filled 2018-07-04: qty 5

## 2018-07-04 MED ORDER — PROMETHAZINE HCL 25 MG/ML IJ SOLN: 6.2500 mg | INTRAMUSCULAR | Status: DC | PRN

## 2018-07-04 MED ORDER — OXYCODONE HCL 5 MG PO TABS
ORAL_TABLET | ORAL | Status: AC
  Filled 2018-07-04: qty 1

## 2018-07-04 MED ORDER — SCOPOLAMINE 1 MG/3DAYS TD PT72
1.0000 | MEDICATED_PATCH | Freq: Once | TRANSDERMAL | Status: DC
  Administered 2018-07-04: 1.5 mg via TRANSDERMAL

## 2018-07-04 MED ORDER — OXYCODONE HCL 5 MG PO TABS
5.0000 mg | ORAL_TABLET | Freq: Once | ORAL | Status: AC | PRN
  Administered 2018-07-04: 5 mg via ORAL

## 2018-07-04 MED ORDER — MIDAZOLAM HCL 2 MG/2ML IJ SOLN
INTRAMUSCULAR | Status: AC
  Filled 2018-07-04: qty 2

## 2018-07-04 MED ORDER — LIDOCAINE HCL 1 % IJ SOLN
INTRAMUSCULAR | Status: AC
  Filled 2018-07-04: qty 20

## 2018-07-04 MED ORDER — LIDOCAINE HCL (CARDIAC) PF 100 MG/5ML IV SOSY
PREFILLED_SYRINGE | INTRAVENOUS | Status: DC | PRN
  Administered 2018-07-04: 50 mg via INTRAVENOUS

## 2018-07-04 MED ORDER — MEPERIDINE HCL 25 MG/ML IJ SOLN: 6.2500 mg | INTRAMUSCULAR | Status: DC | PRN

## 2018-07-04 MED ORDER — ONDANSETRON HCL 4 MG/2ML IJ SOLN
INTRAMUSCULAR | Status: AC
  Filled 2018-07-04: qty 2

## 2018-07-04 SURGICAL SUPPLY — 23 items
BLADE SURG 15 STRL LF C SS BP (BLADE) ×1 IMPLANT
BLADE SURG 15 STRL SS (BLADE) ×3
COUNTER NEEDLE 1200 MAGNETIC (NEEDLE) IMPLANT
ELECT REM PT RETURN 9FT ADLT (ELECTROSURGICAL)
ELECTRODE REM PT RTRN 9FT ADLT (ELECTROSURGICAL) IMPLANT
GLOVE BIO SURGEON STRL SZ7 (GLOVE) ×3 IMPLANT
GLOVE BIOGEL PI IND STRL 7.0 (GLOVE) ×1 IMPLANT
GLOVE BIOGEL PI INDICATOR 7.0 (GLOVE) ×2
HIBICLENS CHG 4% 4OZ BTL (MISCELLANEOUS) ×3 IMPLANT
NEEDLE HYPO 22GX1.5 SAFETY (NEEDLE) ×3 IMPLANT
NS IRRIG 1000ML POUR BTL (IV SOLUTION) ×3 IMPLANT
PACK VAGINAL MINOR WOMEN LF (CUSTOM PROCEDURE TRAY) ×3 IMPLANT
PAD PREP 24X48 CUFFED NSTRL (MISCELLANEOUS) ×3 IMPLANT
PEN SKIN MARKING STD PT W/RULR (MISCELLANEOUS) ×3 IMPLANT
PENCIL BUTTON HOLSTER BLD 10FT (ELECTRODE) IMPLANT
SUT VIC AB 2-0 SH 27 (SUTURE) ×3
SUT VIC AB 2-0 SH 27XBRD (SUTURE) IMPLANT
SUT VICRYL 0 UR6 27IN ABS (SUTURE) ×2 IMPLANT
SYR 20CC LL (SYRINGE) ×3 IMPLANT
TOWEL OR 17X24 6PK STRL BLUE (TOWEL DISPOSABLE) ×6 IMPLANT
TUBING NON-CON 1/4 X 20 CONN (TUBING) IMPLANT
TUBING NON-CON 1/4 X 20' CONN (TUBING)
YANKAUER SUCT BULB TIP NO VENT (SUCTIONS) IMPLANT

## 2018-07-04 NOTE — Anesthesia Postprocedure Evaluation (Signed)
Anesthesia Post Note  Patient: Crystal Lyons  Procedure(s) Performed: WIDE EXCISION VULVECTOMY (N/A Vulva)     Patient location during evaluation: PACU Anesthesia Type: General Level of consciousness: sedated and patient cooperative Pain management: pain level controlled Vital Signs Assessment: post-procedure vital signs reviewed and stable Respiratory status: spontaneous breathing Cardiovascular status: stable Anesthetic complications: no    Last Vitals:  Vitals:   07/04/18 1330 07/04/18 1340  BP: 119/88 124/81  Pulse: 93 91  Resp: 16 19  Temp:    SpO2: 100% 98%    Last Pain:  Vitals:   07/04/18 1304  TempSrc:   PainSc: Asleep   Pain Goal: Patients Stated Pain Goal: 8 (07/04/18 1113)              @ANFLOW60MIN (12500)  )Nolon Nations

## 2018-07-04 NOTE — H&P (Signed)
Crystal Lyons is an 53 y.o. female.   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 She presented initially with self-described boils on her vulva. The lesion visualized was biopsied and shown to be VIN. R/B/A of management options were discussed and the decision was made to proceed with WLE.  No LMP recorded. Patient is postmenopausal.    Past Medical History:  Diagnosis Date  . Anxiety   . Atypical chest pain   . Barrett esophagus   . Carpal tunnel syndrome    right and left wrists  . Dysrhythmia   . GERD (gastroesophageal reflux disease)   . Headache(784.0) 03/09/2014  . Heart murmur   . Hiatal hernia   . Hyperlipidemia   . Hypertension   . Osteopenia   . Seizures ([place]) 09/27/2012  . Shingles   . Sleep apnea   . Sleep disorder 09/27/2012    Past Surgical History:  Procedure Laterality Date  . BRAIN SURGERY  01-06-85  . BRAIN SURGERY Right 1986   "right temporal lobe"  . BREAST BIOPSY    . CHOLECYSTECTOMY    . CYST REMOVAL TRUNK    . ears    . EYE SURGERY     cyst removed  . GALLBLADDER SURGERY N/A 2009  . NECK SURGERY    . SHOULDER SURGERY Bilateral 2009, 2010    Family History  Problem Relation Age of Onset  . Hypertension Father   . Cancer Father   . Diabetes Father   . Heart failure Father   . Cancer Mother   . Thyroid disease Mother   . Rheum arthritis Mother   . Stroke Mother   . Hypertension Mother   . Cirrhosis Sister     Social History:  reports that she has been smoking cigarettes. She has been smoking about 1.50 packs per day. She has quit using smokeless tobacco.  Her smokeless tobacco use included chew. She reports that she does not drink alcohol or use drugs.  Allergies:  Allergies  Allergen Reactions  . Adenosine Anaphylaxis    Heart block  . Aleve [Naproxen Sodium]   . Feldene [Piroxicam]   . Keflex [Cephalexin]   . Mobic [Meloxicam]   . Neurontin [Gabapentin]   . Other     Any anti-inflammatory  . Penicillins  Other (See Comments)    Welts DID THE REACTION INVOLVE: Swelling of the face/tongue/throat, SOB, or low BP? Unknown Sudden or severe rash/hives, skin peeling, or the inside of the mouth or nose? Unknown Did it require medical treatment? Unknown When did it last happen?Childhood allergy If all above answers are "NO", may proceed with cephalosporin use.   . Tape     Medical adhesive tape  . Vioxx [Rofecoxib]     Medications Prior to Admission  Medication Sig Dispense Refill Last Dose  . albuterol (PROVENTIL HFA;VENTOLIN HFA) 108 (90 Base) MCG/ACT inhaler Inhale 2 puffs into the lungs every 6 (six) hours as needed for wheezing or shortness of breath.   Past Week at Unknown time  . ALPRAZolam (XANAX) 0.5 MG tablet TAKE 1 TABLET BY MOUTH 3 TIMES DAILY AS NEEDED (Patient taking differently: Take 0.5 mg by mouth 3 (three) times daily as needed for anxiety. ) 90 tablet 3 07/04/2018 at 0930  . HYDROcodone-acetaminophen (NORCO/VICODIN) 5-325 MG per tablet Take 1 tablet by mouth 4 (four) times daily.    07/04/2018 at 0800  . hydrOXYzine (ATARAX/VISTARIL) 25 MG tablet Take 25 mg by mouth at bedtime as  needed (sleep).    Past Week at Unknown time  . lansoprazole (PREVACID) 30 MG capsule Take 30 mg by mouth daily.    07/04/2018 at 0830  . mupirocin cream (BACTROBAN) 2 % Apply 1 application topically daily as needed (apply to incision).   Past Week at Unknown time  . NASONEX 50 MCG/ACT nasal spray Place 2 sprays into the nose daily.    07/04/2018 at 0900  . pantoprazole (PROTONIX) 40 MG tablet Take 40 mg by mouth daily as needed (heartburn).    07/03/2018 at Unknown time  . Polyethyl Glycol-Propyl Glycol (SYSTANE OP) Apply 1 drop to eye 2 (two) times daily as needed (dry eyes).   Past Week at Unknown time  . pravastatin (PRAVACHOL) 20 MG tablet Take 1 tablet (20 mg total) by mouth every evening. 90 tablet 3   . promethazine (PHENERGAN) 12.5 MG tablet Take 12.5 mg by mouth every 8 (eight) hours as needed  for nausea or vomiting.    Taking  . spironolactone-hydrochlorothiazide (ALDACTAZIDE) 25-25 MG tablet Take 1 tablet by mouth daily.   07/04/2018 at 0830  . sulfacetamide-prednisolone (BLEPHAMIDE) 10-0.2 % ophthalmic suspension Place 1 drop into both eyes 4 (four) times daily.   Past Week at Unknown time  . tiotropium (SPIRIVA HANDIHALER) 18 MCG inhalation capsule Place 18 mcg into inhaler and inhale daily.    Past Week at Unknown time  . tiZANidine (ZANAFLEX) 4 MG tablet Take 4 mg by mouth every 8 (eight) hours as needed for muscle spasms.    Past Month at Unknown time  . triamcinolone cream (KENALOG) 0.1 % Apply 1 application topically 2 (two) times daily as needed (shingles).    Past Month at Unknown time  . VIVELLE-DOT 0.05 MG/24HR Place 1 patch onto the skin 2 (two) times a week.    07/03/2018 at Unknown time    ROS  Blood pressure (!) 129/100, pulse 96, temperature 98 F (36.7 C), temperature source Oral, resp. rate 18, height 5' 1.5" (1.562 m), weight 64.4 kg, SpO2 98 %. Physical Exam   AOX3 NAD Abd Soft Normal work of breasthing  Results for orders placed or performed during the hospital encounter of 07/04/18 (from the past 24 hour(s))  Type and screen     Status: None   Collection Time: 07/04/18 11:04 AM  Result Value Ref Range   ABO/RH(D) O POS    Antibody Screen NEG    Sample Expiration      07/07/2018 Performed at [facility], [address] [place], [place] [address]     No results found.  Assessment/Plan: 1) Admit 2) SCDs 3) Proceed with WLE 4) Discussed importance of smoking cessation in relation to proper vulvar wound healing  Vanessa Kick 07/04/2018, 12:02 PM

## 2018-07-04 NOTE — Discharge Instructions (Signed)
Vulvectomy, Care After Refer to this sheet in the next few weeks. These instructions provide you with information about caring for yourself after your procedure. Your health care provider may also give you more specific instructions. Your treatment has been planned according to current medical practices, but problems sometimes occur. Call your health care provider if you have any problems or questions after your procedure. What can I expect after the procedure? After the procedure, it is common to have:  Vaginal pain.  Vaginal numbness.  Vaginal swelling.  Bloody vaginal discharge. Follow these instructions at home: Activity   Rest as told by your health care provider.  Do not lift, push, or pull more than 5 lb (2.3 kg).  Avoid activities that require a lot of energy for as long as told by your health care provider. This includes any exercise.  Raise (elevate) your legs while sitting or lying down.  Avoid standing or sitting in one place for long periods of time.  Do not cross your legs, especially when sitting. Bathing  Do not take baths, swim, or use a hot tub until your health care provider approves. Ask your health care provider if you can take showers. You may only be allowed to take sponge baths for bathing.  After passing urine or a bowel movement, wipe yourself from front to back and clean your vaginal area using a spray bottle.  If told by your health care provider, take a sitz bath to help with discomfort. This is a warm water bath you take while sitting down. ? Do this 3-4 times per day, or as often as told by your health care provider. ? The water should only come up to your hips and cover your buttocks. ? You may pat the area dry with a soft, clean towel. If needed, you may then gently dry the area with a hair dryer on a cool setting for 5-10 minutes. An enclosed box fan may also be used to gently dry the area. Incision care   Follow instructions from your health  care provider about how to take care of your incision. Make sure you: ? Wash your hands with soap and water before you change your bandage (dressing). If soap and water are not available, use hand sanitizer. ? Change your dressing as told by your health care provider. ? Leave stitches (sutures), skin glue, adhesive strips, or surgical clips in place. These skin closures may need to stay in place for 2 weeks or longer. If adhesive strip edges start to loosen and curl up, you may trim the loose edges. Do not remove adhesive strips completely unless your health care provider tells you to do that.  Check your incision area every day for signs of infection. Check for: ? More redness, swelling, or pain. ? More fluid or blood. ? Warmth. ? Pus or a bad smell. Lifestyle  Do not douche or use tampons until your health care provider approves.  Do not have sex until your health care provider approves. Tell your health care provider if you have pain or numbness when you return to sexual activity.  Wear comfortable, loose-fitting clothing. Driving  Do not drive or operate heavy machinery while taking prescription pain medicine.  Do not drive for 24 hours if you received a medicine to help you relax (sedative). General instructions   Take over-the-counter and prescription medicines only as told by your health care provider.  Take a stool softener to help prevent constipation as told by your health  care provider. You may need to do this if you are taking prescription pain medicine.  Drink enough fluid to keep your urine clear or pale yellow.  If you were sent home with a drain, take care of it as told by your health care provider.  Wear compression stockings as told by your health care provider. These stockings help to prevent blood clots and reduce swelling in your legs.  Keep all follow-up visits as told by your health care provider. This is important. Contact a health care provider if:  You  have more redness, swelling, or pain around your incision.  You have more fluid or blood coming from your incision.  Your incision feels warm to the touch.  You have pus or a bad smell coming from your incision.  You have a fever.  You have painful or bloody urination.  You feel nauseous or you vomit.  You develop diarrhea.  You develop constipation.  You develop a rash.  You feel dizzy or light-headed.  You have pain that does not get better with medicine.  Your incision breaks open. Get help right away if:  You faint.  You develop leg or chest pain.  You develop abdominal pain.  You develop shortness of breath. This information is not intended to replace advice given to you by your health care provider. Make sure you discuss any questions you have with your health care provider. Document Released: 01/18/2004 Document Revised: 11/11/2015 Document Reviewed: 05/31/2015 Elsevier Interactive Patient Education  2019 [place] [facility] Care Instructions  Activity: Get plenty of rest for the remainder of the day. A responsible individual must stay with you for 24 hours following the procedure.  For the next 24 hours, DO NOT: -Drive a car -Paediatric nurse -Drink alcoholic beverages -Take any medication unless instructed by your physician -Make any legal decisions or sign important papers.  Meals: Start with liquid foods such as gelatin or soup. Progress to regular foods as tolerated. Avoid greasy, spicy, heavy foods. If nausea and/or vomiting occur, drink only clear liquids until the nausea and/or vomiting subsides. Call your physician if vomiting continues.  Special Instructions/Symptoms: Your throat may feel dry or sore from the anesthesia or the breathing tube placed in your throat during surgery. If this causes discomfort, gargle with warm salt water. The discomfort should disappear within 24 hours.  If you had a scopolamine patch placed  behind your ear for the management of post- operative nausea and/or vomiting:  1. The medication in the patch is effective for 72 hours, after which it should be removed.  Wrap patch in a tissue and discard in the trash. Wash hands thoroughly with soap and water. 2. You may remove the patch earlier than 72 hours if you experience unpleasant side effects which may include dry mouth, dizziness or visual disturbances. 3. Avoid touching the patch. Wash your hands with soap and water after contact with the patch.

## 2018-07-04 NOTE — Op Note (Signed)
  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 Vanessa Kick Assistant: None Operative Findings: Healing lesion at the anterior commissure, c/w prior biopsy site.  Specimen: Wide local excision at anterior commissure with suture marking 12:00 EBL: minimal  Crystal Lyons Is a 53 year old who presents for definitive surgical management for VIN. Please see the patient's history and physical for complete details of the history. Management options were discussed with the patient. R/B/A reviewed. Following appropriate informed consent was taken to the operating room. The patient was appropriately identified during a time out procedure. General anesthesia was administered and the patient was placed in the dorsal lithotomy position. The patient was prepped and draped in the normal sterile fashion. The area of excision was marked with a surgical marking pen. Due to the proximity to the clitoris, a 1 cm margin at the inferior edge of the lesion could not be obtained. Superior and lateral margins were marked at 1 cm. The skin was infiltrated with 1% lidocaine with 1:200000 epinephrine. A 15 blade scalpel was used to incise the skin down through the underlying soft tissue. The specimen was marked with a suture at 12. The subcutaneous tissue was reapproximated with vicryl interrupted sutures. The skin was reapproximated with horizontal mattress sutures. Hemostasis was achieved. The patient tolerated the procedure well and was transferred to the PACU in stable condition.

## 2018-07-04 NOTE — Transfer of Care (Signed)
Immediate Anesthesia Transfer of Care Note  Patient: Crystal Lyons  Procedure(s) Performed: WIDE EXCISION VULVECTOMY (N/A Vulva)  Patient Location: PACU  Anesthesia Type:General  Level of Consciousness: awake, alert  and oriented  Airway & Oxygen Therapy: Patient Spontanous Breathing and Patient connected to nasal cannula oxygen  Post-op Assessment: Report given to RN and Post -op Vital signs reviewed and stable  Post vital signs: Reviewed and stable  Last Vitals:  Vitals Value Taken Time  BP 140/87 07/04/2018  1:04 PM  Temp    Pulse 119 07/04/2018  1:05 PM  Resp 11 07/04/2018  1:05 PM  SpO2 97 % 07/04/2018  1:05 PM  Vitals shown include unvalidated device data.  Last Pain:  Vitals:   07/04/18 1113  TempSrc: Oral  PainSc: 8       Patients Stated Pain Goal: 8 (33/35/45 6256)  Complications: No apparent anesthesia complications

## 2018-07-04 NOTE — Anesthesia Procedure Notes (Signed)
Procedure Name: LMA Insertion Date/Time: 07/04/2018 12:26 PM Performed by: Jonna Munro, CRNA Pre-anesthesia Checklist: Patient identified, Emergency Drugs available, Suction available, Patient being monitored and Timeout performed Patient Re-evaluated:Patient Re-evaluated prior to induction Oxygen Delivery Method: Circle system utilized Preoxygenation: Pre-oxygenation with 100% oxygen Induction Type: IV induction LMA: LMA inserted LMA Size: 4.0 Number of attempts: 1 Placement Confirmation: positive ETCO2 and breath sounds checked- equal and bilateral Tube secured with: Tape Dental Injury: Teeth and Oropharynx as per pre-operative assessment

## 2018-07-05 ENCOUNTER — Encounter (HOSPITAL_COMMUNITY): Payer: Self-pay | Admitting: Obstetrics and Gynecology

## 2018-07-24 DIAGNOSIS — K2270 Barrett's esophagus without dysplasia: Secondary | ICD-10-CM | POA: Diagnosis not present

## 2018-07-24 DIAGNOSIS — K219 Gastro-esophageal reflux disease without esophagitis: Secondary | ICD-10-CM | POA: Diagnosis not present

## 2018-07-24 DIAGNOSIS — K5730 Diverticulosis of large intestine without perforation or abscess without bleeding: Secondary | ICD-10-CM | POA: Diagnosis not present

## 2018-07-25 DIAGNOSIS — G4453 Primary thunderclap headache: Secondary | ICD-10-CM | POA: Diagnosis not present

## 2018-08-01 ENCOUNTER — Other Ambulatory Visit: Payer: Self-pay

## 2018-08-01 ENCOUNTER — Ambulatory Visit: Payer: Medicare HMO | Admitting: Neurology

## 2018-08-01 ENCOUNTER — Encounter: Payer: Self-pay | Admitting: Neurology

## 2018-08-01 VITALS — BP 130/86 | HR 84 | Resp 18 | Ht 61.5 in | Wt 142.0 lb

## 2018-08-01 DIAGNOSIS — R569 Unspecified convulsions: Secondary | ICD-10-CM

## 2018-08-01 DIAGNOSIS — R799 Abnormal finding of blood chemistry, unspecified: Secondary | ICD-10-CM | POA: Diagnosis not present

## 2018-08-01 DIAGNOSIS — E538 Deficiency of other specified B group vitamins: Secondary | ICD-10-CM | POA: Diagnosis not present

## 2018-08-01 DIAGNOSIS — G4482 Headache associated with sexual activity: Secondary | ICD-10-CM

## 2018-08-01 DIAGNOSIS — G4489 Other headache syndrome: Secondary | ICD-10-CM | POA: Diagnosis not present

## 2018-08-01 NOTE — Progress Notes (Signed)
Reason for visit: Headache  Referring physician: [name] is a 53 y.o. female  History of present illness:  Crystal Lyons is a 53 year old right-handed white female with a history of seizures, status post epilepsy surgery with resection of the anterior portion of the right temporal lobe.  The patient has been able to come off of all of her seizure medications.  She has had some problems with headaches that are relatively frequent, she has also had some problems with irritability and mild memory issues.  The patient comes in today with a new problem.  The patient had surgical resection of a precancerous lesion from the vulva about 1 month ago, shortly thereafter, the patient began having multiple orgasms, and with each orgasm she would have left-sided headaches that would start in the front of the head and shoot backwards to the back of the head.  The headaches would be very sharp but quite brief.  The patient has noted some gradual reduction in the frequency of the headaches as time has gone on.  The patient indicates that this is different from her usual headache.  She reports no new numbness, weakness, balance problems, or difficulty controlling the bowels or the bladder.  She does have some underlying nausea.  She is sent to this office for further evaluation.  She denies any visual disturbances with the headache.  Past Medical History:  Diagnosis Date  . Anxiety   . Atypical chest pain   . Barrett esophagus   . Carpal tunnel syndrome    right and left wrists  . Dysrhythmia   . GERD (gastroesophageal reflux disease)   . Headache(784.0) 03/09/2014  . Heart murmur   . Hiatal hernia   . Hyperlipidemia   . Hypertension   . Osteopenia   . Seizures ([place]) 09/27/2012  . Shingles   . Sleep apnea   . Sleep disorder 09/27/2012    Past Surgical History:  Procedure Laterality Date  . BRAIN SURGERY  01-06-85  . BRAIN SURGERY Right 1986   "right temporal lobe"  . BREAST BIOPSY     . CHOLECYSTECTOMY    . CYST REMOVAL TRUNK    . ears    . EYE SURGERY     cyst removed  . GALLBLADDER SURGERY N/A 2009  . NECK SURGERY    . SHOULDER SURGERY Bilateral 2009, 2010  . VULVECTOMY N/A 07/04/2018   Procedure: WIDE EXCISION VULVECTOMY;  Surgeon: Vanessa Kick, MD;  Location: [facility];  Service: Gynecology;  Laterality: N/A;    Family History  Problem Relation Age of Onset  . Hypertension Father   . Cancer Father   . Diabetes Father   . Heart failure Father   . Cancer Mother   . Thyroid disease Mother   . Rheum arthritis Mother   . Stroke Mother   . Hypertension Mother   . Cirrhosis Sister     Social history:  reports that she has been smoking cigarettes. She has been smoking about 1.50 packs per day. She has quit using smokeless tobacco.  Her smokeless tobacco use included chew. She reports that she does not drink alcohol or use drugs.  Medications:  Prior to Admission medications   Medication Sig Start Date End Date Taking? Authorizing Provider  albuterol (PROVENTIL HFA;VENTOLIN HFA) 108 (90 Base) MCG/ACT inhaler Inhale 2 puffs into the lungs every 6 (six) hours as needed for wheezing or shortness of breath.   Yes [provider]  ALPRAZolam Duanne Moron)  0.5 MG tablet TAKE 1 TABLET BY MOUTH 3 TIMES DAILY AS NEEDED Patient taking differently: Take 0.5 mg by mouth 3 (three) times daily as needed for anxiety.  04/03/18  Yes Kathrynn Ducking, MD  HYDROcodone-acetaminophen (NORCO/VICODIN) 5-325 MG per tablet Take 1 tablet by mouth 4 (four) times daily.  09/19/12  Yes [provider]  hydrOXYzine (ATARAX/VISTARIL) 25 MG tablet Take 25 mg by mouth at bedtime as needed (sleep).    Yes [provider]  lansoprazole (PREVACID) 30 MG capsule Take 30 mg by mouth daily.  09/19/12  Yes [provider]  NASONEX 50 MCG/ACT nasal spray Place 2 sprays into the nose daily.  09/19/12  Yes [provider]  pantoprazole (PROTONIX) 40 MG tablet Take 40 mg by  mouth daily as needed (heartburn).  07/18/17  Yes [provider]  spironolactone-hydrochlorothiazide (ALDACTAZIDE) 25-25 MG tablet Take 1 tablet by mouth daily. 10/22/17  Yes [provider]  tiotropium (SPIRIVA HANDIHALER) 18 MCG inhalation capsule Place 18 mcg into inhaler and inhale daily.  08/17/17  Yes [provider]  tiZANidine (ZANAFLEX) 4 MG tablet Take 4 mg by mouth every 8 (eight) hours as needed for muscle spasms.  01/17/13  Yes [provider]  VIVELLE-DOT 0.05 MG/24HR Place 1 patch onto the skin 2 (two) times a week.  09/19/12  Yes [provider]  pravastatin (PRAVACHOL) 20 MG tablet Take 1 tablet (20 mg total) by mouth every evening. 03/22/18 06/24/18  Park Liter, MD  promethazine (PHENERGAN) 12.5 MG tablet Take 12.5 mg by mouth every 8 (eight) hours as needed for nausea or vomiting.  11/30/15 06/24/18  [provider]      Allergies  Allergen Reactions  . Adenosine Anaphylaxis    Heart block  . Aleve [Naproxen Sodium]   . Feldene [Piroxicam]   . Keflex [Cephalexin]   . Mobic [Meloxicam]   . Neurontin [Gabapentin]   . Other     Any anti-inflammatory  . Penicillins Other (See Comments)    Welts DID THE REACTION INVOLVE: Swelling of the face/tongue/throat, SOB, or low BP? Unknown Sudden or severe rash/hives, skin peeling, or the inside of the mouth or nose? Unknown Did it require medical treatment? Unknown When did it last happen?Childhood allergy If all above answers are "NO", may proceed with cephalosporin use.   . Tape     Medical adhesive tape  . Vioxx [Rofecoxib]     ROS:  Out of a complete 14 system review of symptoms, the patient complains only of the following symptoms, and all other reviewed systems are negative.  Headache Irritability Memory disturbance  Blood pressure 130/86, pulse 84, resp. rate 18, height 5' 1.5" (1.562 m), weight 142 lb (64.4 kg).  Physical Exam  General: The patient is  alert and cooperative at the time of the examination.  Eyes: Pupils are equal, round, and reactive to light. Discs are flat bilaterally.  Neck: The neck is supple, no carotid bruits are noted.  Respiratory: The respiratory examination is clear.  Cardiovascular: The cardiovascular examination reveals a regular rate and rhythm, no obvious murmurs or rubs are noted.  Skin: Extremities are without significant edema.  Neurologic Exam  Mental status: The patient is alert and oriented x 3 at the time of the examination. The patient has apparent normal recent and remote memory, with an apparently normal attention span and concentration ability.  Cranial nerves: Facial symmetry is present. There is good sensation of the face to pinprick and soft  touch bilaterally. The strength of the facial muscles and the muscles to head turning and shoulder shrug are normal bilaterally. Speech is well enunciated, no aphasia or dysarthria is noted. Extraocular movements are full. Visual fields are full. The tongue is midline, and the patient has symmetric elevation of the soft palate. No obvious hearing deficits are noted.  Motor: The motor testing reveals 5 over 5 strength of all 4 extremities. Good symmetric motor tone is noted throughout.  Sensory: Sensory testing is intact to pinprick, soft touch, vibration sensation, and position sense on all 4 extremities. No evidence of extinction is noted.  Coordination: Cerebellar testing reveals good finger-nose-finger and heel-to-shin bilaterally.  Gait and station: Gait is normal. Tandem gait is normal. Romberg is negative. No drift is seen.  Reflexes: Deep tendon reflexes are symmetric and normal bilaterally. Toes are downgoing bilaterally.   Assessment/Plan:  1.  Coital headache, new onset  2.  History of seizures, status post epilepsy surgery  3.  Reports of mild memory disturbance  The patient has had onset of a new type of headache consistent with coital  headache.  The patient likely will have spontaneous resolution of these headaches within the next month or 2.  MRI of the brain will be done, we will do MRI angiogram of the head.  The patient will follow-up in 6 months.  When last seen, she was placed on BuSpar for her irritability but could not tolerate the drug.  Blood work will be done today.  Crystal Alexanders MD 08/01/2018 1:33 PM  [facility] [address] [place], [place] [address]  Phone [phone] Fax [phone]

## 2018-08-02 ENCOUNTER — Telehealth: Payer: Self-pay | Admitting: Neurology

## 2018-08-02 LAB — VITAMIN B12: Vitamin B-12: 312 pg/mL (ref 232–1245)

## 2018-08-02 LAB — HIV ANTIBODY (ROUTINE TESTING W REFLEX): HIV Screen 4th Generation wRfx: NONREACTIVE

## 2018-08-02 LAB — RPR: RPR Ser Ql: NONREACTIVE

## 2018-08-02 LAB — SEDIMENTATION RATE: Sed Rate: 4 mm/hr (ref 0–40)

## 2018-08-02 NOTE — Telephone Encounter (Signed)
human pending faxed clinical notes

## 2018-08-05 NOTE — Telephone Encounter (Signed)
Crystal Lyons: [id number] (exp. 08/02/18 to 09/01/18) order sent to GI. They will reach out to the pt to schedule.

## 2018-08-14 ENCOUNTER — Ambulatory Visit
Admission: RE | Admit: 2018-08-14 | Discharge: 2018-08-14 | Disposition: A | Discharge status: Home / Self Care | Payer: Medicare HMO | Source: Ambulatory Visit | Attending: Neurology | Admitting: Neurology

## 2018-08-14 DIAGNOSIS — G4489 Other headache syndrome: Secondary | ICD-10-CM

## 2018-08-14 MED ORDER — GADOBENATE DIMEGLUMINE 529 MG/ML IV SOLN
13.0000 mL | Freq: Once | INTRAVENOUS | Status: AC | PRN
  Administered 2018-08-14: 13 mL via INTRAVENOUS

## 2018-08-16 ENCOUNTER — Telehealth: Payer: Self-pay | Admitting: Neurology

## 2018-08-16 ENCOUNTER — Ambulatory Visit (INDEPENDENT_AMBULATORY_CARE_PROVIDER_SITE_OTHER): Payer: Medicare HMO | Admitting: Cardiology

## 2018-08-16 VITALS — BP 110/80 | HR 97 | Ht 61.5 in | Wt 145.4 lb

## 2018-08-16 DIAGNOSIS — E785 Hyperlipidemia, unspecified: Secondary | ICD-10-CM | POA: Diagnosis not present

## 2018-08-16 DIAGNOSIS — I10 Essential (primary) hypertension: Secondary | ICD-10-CM

## 2018-08-16 DIAGNOSIS — R000 Tachycardia, unspecified: Secondary | ICD-10-CM

## 2018-08-16 DIAGNOSIS — F17200 Nicotine dependence, unspecified, uncomplicated: Secondary | ICD-10-CM

## 2018-08-16 HISTORY — DX: Tachycardia, unspecified: R00.0

## 2018-08-16 NOTE — Progress Notes (Signed)
Cardiology Office Note:    Date:  08/16/2018   ID:  Crystal Lyons, DOB 1965-11-26, MRN [id number]  PCP:  Street, Sharon Mt, MD  Cardiologist:  Jenne Campus, MD    Referring MD: Street, Sharon Mt, *   Chief Complaint  Patient presents with  . Follow-up  I had surgery but doing better  History of Present Illness:    Crystal Lyons is a 53 y.o. female with resting sinus tachycardia.  She recently went to surgery with no difficulties now complains of being weak tired exhausted after that but overall seems to be doing well.  She does have few concerns.  She is worried that the cholesterol medication gave her constipation she actually stopped taking that medication she takes pravastatin maybe twice a week.  And described to have some pain in the chest but it is not related to exercise.  She is scheduled to see a gastroenterologist to have gastroscopy because she does have Barrett's esophagus.  There is an excellent idea.  Past Medical History:  Diagnosis Date  . Anxiety   . Atypical chest pain   . Barrett esophagus   . Carpal tunnel syndrome    right and left wrists  . Dysrhythmia   . GERD (gastroesophageal reflux disease)   . Headache(784.0) 03/09/2014  . Heart murmur   . Hiatal hernia   . Hyperlipidemia   . Hypertension   . Osteopenia   . Seizures ([place]) 09/27/2012  . Shingles   . Sleep apnea   . Sleep disorder 09/27/2012    Past Surgical History:  Procedure Laterality Date  . BRAIN SURGERY  01-06-85  . BRAIN SURGERY Right 1986   "right temporal lobe"  . BREAST BIOPSY    . CHOLECYSTECTOMY    . CYST REMOVAL TRUNK    . ears    . EYE SURGERY     cyst removed  . GALLBLADDER SURGERY N/A 2009  . NECK SURGERY    . SHOULDER SURGERY Bilateral 2009, 2010  . VULVECTOMY N/A 07/04/2018   Procedure: WIDE EXCISION VULVECTOMY;  Surgeon: Vanessa Kick, MD;  Location: [facility];  Service: Gynecology;  Laterality: N/A;    Current Medications: Current Meds  Medication Sig  .  albuterol (PROVENTIL HFA;VENTOLIN HFA) 108 (90 Base) MCG/ACT inhaler Inhale 2 puffs into the lungs every 6 (six) hours as needed for wheezing or shortness of breath.  . ALPRAZolam (XANAX) 0.5 MG tablet TAKE 1 TABLET BY MOUTH 3 TIMES DAILY AS NEEDED (Patient taking differently: Take 0.5 mg by mouth 3 (three) times daily as needed for anxiety. )  . HYDROcodone-acetaminophen (NORCO/VICODIN) 5-325 MG per tablet Take 1 tablet by mouth 4 (four) times daily.   . hydrOXYzine (ATARAX/VISTARIL) 25 MG tablet Take 25 mg by mouth at bedtime as needed (sleep).   . lansoprazole (PREVACID) 30 MG capsule Take 30 mg by mouth daily.   Marland Kitchen NASONEX 50 MCG/ACT nasal spray Place 2 sprays into the nose daily.   . pantoprazole (PROTONIX) 40 MG tablet Take 40 mg by mouth daily as needed (heartburn).   . pravastatin (PRAVACHOL) 20 MG tablet Take 1 tablet (20 mg total) by mouth every evening.  . promethazine (PHENERGAN) 12.5 MG tablet Take 12.5 mg by mouth every 8 (eight) hours as needed for nausea or vomiting.   Marland Kitchen spironolactone-hydrochlorothiazide (ALDACTAZIDE) 25-25 MG tablet Take 1 tablet by mouth daily.  Marland Kitchen tiotropium (SPIRIVA HANDIHALER) 18 MCG inhalation capsule Place 18 mcg into inhaler and inhale daily.   Marland Kitchen tiZANidine (  ZANAFLEX) 4 MG tablet Take 4 mg by mouth every 8 (eight) hours as needed for muscle spasms.   Marland Kitchen VIVELLE-DOT 0.05 MG/24HR Place 1 patch onto the skin 2 (two) times a week.      Allergies:   Adenosine; Aleve [naproxen sodium]; Feldene [piroxicam]; Keflex [cephalexin]; Mobic [meloxicam]; Neurontin [gabapentin]; Other; Penicillins; Tape; and Vioxx [rofecoxib]   Social History   Socioeconomic History  . Marital status: Single    Spouse name: Not on file  . Number of children: 0  . Years of education: 8  . Highest education level: Not on file  Occupational History  . Occupation: Disabled  Social Needs  . Financial resource strain: Not on file  . Food insecurity:    Worry: Not on file    Inability:  Not on file  . Transportation needs:    Medical: Not on file    Non-medical: Not on file  Tobacco Use  . Smoking status: Current Every Day Smoker    Packs/day: 1.50    Types: Cigarettes  . Smokeless tobacco: Former Systems developer    Types: Chew  Substance and Sexual Activity  . Alcohol use: No  . Drug use: No  . Sexual activity: Not on file  Lifestyle  . Physical activity:    Days per week: Not on file    Minutes per session: Not on file  . Stress: Not on file  Relationships  . Social connections:    Talks on phone: Not on file    Gets together: Not on file    Attends religious service: Not on file    Active member of club or organization: Not on file    Attends meetings of clubs or organizations: Not on file    Relationship status: Not on file  Other Topics Concern  . Not on file  Social History Narrative   Patient lives at home mother    Patient has no children.    Patient is single.    Patient has a 8th grade education.    Patient is disabled           Family History: The patient's family history includes Cancer in her father and mother; Cirrhosis in her sister; Diabetes in her father; Heart failure in her father; Hypertension in her father and mother; Rheum arthritis in her mother; Stroke in her mother; Thyroid disease in her mother. ROS:   Please see the history of present illness.    All 14 point review of systems negative except as described per history of present illness  EKGs/Labs/Other Studies Reviewed:      Recent Labs: 06/28/2018: BUN 10; Creatinine, Ser 1.05; Hemoglobin 15.1; Platelets 333; Potassium 4.6; Sodium 138  Recent Lipid Panel    Component Value Date/Time   CHOL 245 (H) 03/18/2018 1619   TRIG 258 (H) 03/18/2018 1619   HDL 32 (L) 03/18/2018 1619   CHOLHDL 7.7 (H) 03/18/2018 1619   LDLCALC 161 (H) 03/18/2018 1619    Physical Exam:    VS:  BP 110/80   Pulse 97   Ht 5' 1.5" (1.562 m)   Wt 145 lb 6.4 oz (66 kg)   SpO2 98%   BMI 27.03 kg/m       Wt Readings from Last 3 Encounters:  08/16/18 145 lb 6.4 oz (66 kg)  08/01/18 142 lb (64.4 kg)  07/04/18 142 lb (64.4 kg)     GEN:  Well nourished, well developed in no acute distress HEENT: Normal NECK: No JVD; No  carotid bruits LYMPHATICS: No lymphadenopathy CARDIAC: RRR, no murmurs, no rubs, no gallops RESPIRATORY:  Clear to auscultation without rales, wheezing or rhonchi  ABDOMEN: Soft, non-tender, non-distended MUSCULOSKELETAL:  No edema; No deformity  SKIN: Warm and dry LOWER EXTREMITIES: no swelling NEUROLOGIC:  Alert and oriented x 3 PSYCHIATRIC:  Normal affect   ASSESSMENT:    1. Essential hypertension   2. Smoking   3. Dyslipidemia   4. Sinus tachycardia    PLAN:    In order of problems listed above:  1. Essential hypertension blood pressure controlled today we will continue present management. 2. Smoking obviously problem she understands and to quit. 3. Dyslipidemia she takes only pravastatin twice a week I asked her to increase the dose to every other day if she can I am worried that may be constipation she experienced before was related to pain medication. 4. Sinus tachycardia.  Echocardiogram preserved ejection fraction monitor did not show any critical finding will continue monitoring.   Medication Adjustments/Labs and Tests Ordered: Current medicines are reviewed at length with the patient today.  Concerns regarding medicines are outlined above.  No orders of the defined types were placed in this encounter.  Medication changes: No orders of the defined types were placed in this encounter.   Signed, Park Liter, MD, [facility] 08/16/2018 1:36 PM    [place]

## 2018-08-16 NOTE — Patient Instructions (Signed)
Medication Instructions:  Your physician recommends that you continue on your current medications as directed. Please refer to the Current Medication list given to you today.  If you need a refill on your cardiac medications before your next appointment, please call your pharmacy.   Lab work: None.  If you have labs (blood work) drawn today and your tests are completely normal, you will receive your results only by: . MyChart Message (if you have MyChart) OR . A paper copy in the mail If you have any lab test that is abnormal or we need to change your treatment, we will call you to review the results.  Testing/Procedures: None.   Follow-Up: At [facility], you and your health needs are our priority.  As part of our continuing mission to provide you with exceptional heart care, we have created designated Provider Care Teams.  These Care Teams include your primary Cardiologist (physician) and Advanced Practice Providers (APPs -  Physician Assistants and Nurse Practitioners) who all work together to provide you with the care you need, when you need it. You will need a follow up appointment in 5 months.  Please call our office 2 months in advance to schedule this appointment.  You may see No primary care provider on file. or another member of our [facility] Provider Team in [place]: Brian Munley, MD . Rajan Revankar, MD  Any Other Special Instructions Will Be Listed Below (If Applicable).    

## 2018-08-16 NOTE — Telephone Encounter (Signed)
I called the patient.  MRI brain shows area of prior surgery, MRI angiogram is unremarkable, no lesions that would produce a traction type headache.    MRI brain 08/15/18:  IMPRESSION: Abnormal MRI scan of the brain showing area of cystic encephalomalacia with mild gliosis in the right anterior temporal region with overlying craniotomy postsurgical changes.  No enhancing lesions are noted.  No significant change compared with CT head 01/26/2014.   MRA head 08/15/18:  IMPRESSION: Unremarkable MRA of the brain showing no significant stenosis or aneurysms involving the large and medium size intracranial vessels.

## 2018-08-16 NOTE — Telephone Encounter (Signed)
I called the patient.  MRI of the brain and MRA of the head is normal.  Blood work was normal.

## 2018-08-16 NOTE — Telephone Encounter (Signed)
Pt has returned the call to [name] with additional questions, she is asking for a call back

## 2018-08-20 DIAGNOSIS — K210 Gastro-esophageal reflux disease with esophagitis: Secondary | ICD-10-CM | POA: Diagnosis not present

## 2018-08-20 DIAGNOSIS — K449 Diaphragmatic hernia without obstruction or gangrene: Secondary | ICD-10-CM | POA: Diagnosis not present

## 2018-08-20 DIAGNOSIS — R1013 Epigastric pain: Secondary | ICD-10-CM | POA: Diagnosis not present

## 2018-08-20 DIAGNOSIS — F17210 Nicotine dependence, cigarettes, uncomplicated: Secondary | ICD-10-CM | POA: Diagnosis not present

## 2018-08-20 DIAGNOSIS — K313 Pylorospasm, not elsewhere classified: Secondary | ICD-10-CM | POA: Diagnosis not present

## 2018-08-20 DIAGNOSIS — Z8719 Personal history of other diseases of the digestive system: Secondary | ICD-10-CM | POA: Diagnosis not present

## 2018-08-20 DIAGNOSIS — K2970 Gastritis, unspecified, without bleeding: Secondary | ICD-10-CM | POA: Diagnosis not present

## 2018-08-20 DIAGNOSIS — K259 Gastric ulcer, unspecified as acute or chronic, without hemorrhage or perforation: Secondary | ICD-10-CM | POA: Diagnosis not present

## 2018-08-20 DIAGNOSIS — G40909 Epilepsy, unspecified, not intractable, without status epilepticus: Secondary | ICD-10-CM | POA: Diagnosis not present

## 2018-08-20 DIAGNOSIS — K208 Other esophagitis: Secondary | ICD-10-CM | POA: Diagnosis not present

## 2018-08-20 DIAGNOSIS — K219 Gastro-esophageal reflux disease without esophagitis: Secondary | ICD-10-CM | POA: Diagnosis not present

## 2018-08-20 DIAGNOSIS — M1990 Unspecified osteoarthritis, unspecified site: Secondary | ICD-10-CM | POA: Diagnosis not present

## 2018-08-20 DIAGNOSIS — K2270 Barrett's esophagus without dysplasia: Secondary | ICD-10-CM | POA: Diagnosis not present

## 2018-08-20 DIAGNOSIS — G4730 Sleep apnea, unspecified: Secondary | ICD-10-CM | POA: Diagnosis not present

## 2018-08-20 DIAGNOSIS — I10 Essential (primary) hypertension: Secondary | ICD-10-CM | POA: Diagnosis not present

## 2018-08-29 DIAGNOSIS — M5137 Other intervertebral disc degeneration, lumbosacral region: Secondary | ICD-10-CM | POA: Diagnosis not present

## 2018-08-29 DIAGNOSIS — M4722 Other spondylosis with radiculopathy, cervical region: Secondary | ICD-10-CM | POA: Diagnosis not present

## 2018-08-29 DIAGNOSIS — M961 Postlaminectomy syndrome, not elsewhere classified: Secondary | ICD-10-CM | POA: Diagnosis not present

## 2018-08-29 DIAGNOSIS — M5030 Other cervical disc degeneration, unspecified cervical region: Secondary | ICD-10-CM | POA: Diagnosis not present

## 2018-09-18 ENCOUNTER — Other Ambulatory Visit: Payer: Self-pay | Admitting: Neurology

## 2018-09-23 DIAGNOSIS — K432 Incisional hernia without obstruction or gangrene: Secondary | ICD-10-CM | POA: Diagnosis not present

## 2018-09-23 DIAGNOSIS — R1033 Periumbilical pain: Secondary | ICD-10-CM | POA: Diagnosis not present

## 2018-09-23 DIAGNOSIS — Z6826 Body mass index (BMI) 26.0-26.9, adult: Secondary | ICD-10-CM | POA: Diagnosis not present

## 2018-09-23 DIAGNOSIS — F17210 Nicotine dependence, cigarettes, uncomplicated: Secondary | ICD-10-CM | POA: Diagnosis not present

## 2018-09-23 DIAGNOSIS — F17200 Nicotine dependence, unspecified, uncomplicated: Secondary | ICD-10-CM | POA: Diagnosis not present

## 2018-10-02 DIAGNOSIS — K210 Gastro-esophageal reflux disease with esophagitis: Secondary | ICD-10-CM | POA: Diagnosis not present

## 2018-10-02 DIAGNOSIS — K432 Incisional hernia without obstruction or gangrene: Secondary | ICD-10-CM | POA: Diagnosis not present

## 2018-10-02 DIAGNOSIS — I10 Essential (primary) hypertension: Secondary | ICD-10-CM | POA: Diagnosis not present

## 2018-10-21 DIAGNOSIS — E559 Vitamin D deficiency, unspecified: Secondary | ICD-10-CM | POA: Diagnosis not present

## 2018-10-21 DIAGNOSIS — M810 Age-related osteoporosis without current pathological fracture: Secondary | ICD-10-CM | POA: Diagnosis not present

## 2018-10-21 DIAGNOSIS — Z7189 Other specified counseling: Secondary | ICD-10-CM | POA: Diagnosis not present

## 2018-10-22 ENCOUNTER — Ambulatory Visit: Payer: Medicare HMO | Admitting: Adult Health

## 2018-10-29 DIAGNOSIS — M5030 Other cervical disc degeneration, unspecified cervical region: Secondary | ICD-10-CM | POA: Diagnosis not present

## 2018-10-29 DIAGNOSIS — M961 Postlaminectomy syndrome, not elsewhere classified: Secondary | ICD-10-CM | POA: Diagnosis not present

## 2018-10-29 DIAGNOSIS — M4722 Other spondylosis with radiculopathy, cervical region: Secondary | ICD-10-CM | POA: Diagnosis not present

## 2018-10-29 DIAGNOSIS — M5137 Other intervertebral disc degeneration, lumbosacral region: Secondary | ICD-10-CM | POA: Diagnosis not present

## 2018-11-08 DIAGNOSIS — Z1159 Encounter for screening for other viral diseases: Secondary | ICD-10-CM | POA: Diagnosis not present

## 2018-11-15 DIAGNOSIS — F17200 Nicotine dependence, unspecified, uncomplicated: Secondary | ICD-10-CM | POA: Diagnosis not present

## 2018-11-15 DIAGNOSIS — K432 Incisional hernia without obstruction or gangrene: Secondary | ICD-10-CM | POA: Diagnosis not present

## 2018-11-15 DIAGNOSIS — Z01818 Encounter for other preprocedural examination: Secondary | ICD-10-CM | POA: Diagnosis not present

## 2018-11-15 DIAGNOSIS — K430 Incisional hernia with obstruction, without gangrene: Secondary | ICD-10-CM | POA: Diagnosis not present

## 2018-11-15 DIAGNOSIS — K219 Gastro-esophageal reflux disease without esophagitis: Secondary | ICD-10-CM | POA: Diagnosis not present

## 2018-11-15 DIAGNOSIS — I10 Essential (primary) hypertension: Secondary | ICD-10-CM | POA: Diagnosis not present

## 2018-12-18 DIAGNOSIS — J019 Acute sinusitis, unspecified: Secondary | ICD-10-CM | POA: Diagnosis not present

## 2018-12-18 DIAGNOSIS — H6692 Otitis media, unspecified, left ear: Secondary | ICD-10-CM | POA: Diagnosis not present

## 2019-01-14 ENCOUNTER — Other Ambulatory Visit: Payer: Self-pay

## 2019-01-14 ENCOUNTER — Ambulatory Visit: Payer: Medicare HMO | Admitting: Cardiology

## 2019-01-14 ENCOUNTER — Telehealth (INDEPENDENT_AMBULATORY_CARE_PROVIDER_SITE_OTHER): Payer: Medicare HMO | Admitting: Cardiology

## 2019-01-14 ENCOUNTER — Encounter: Payer: Self-pay | Admitting: Cardiology

## 2019-01-14 VITALS — BP 121/75 | HR 84 | Wt 131.0 lb

## 2019-01-14 DIAGNOSIS — E785 Hyperlipidemia, unspecified: Secondary | ICD-10-CM

## 2019-01-14 DIAGNOSIS — F17200 Nicotine dependence, unspecified, uncomplicated: Secondary | ICD-10-CM

## 2019-01-14 DIAGNOSIS — I10 Essential (primary) hypertension: Secondary | ICD-10-CM

## 2019-01-14 DIAGNOSIS — Z131 Encounter for screening for diabetes mellitus: Secondary | ICD-10-CM

## 2019-01-14 DIAGNOSIS — R000 Tachycardia, unspecified: Secondary | ICD-10-CM

## 2019-01-14 NOTE — Addendum Note (Signed)
Addended by: Ashok Norris on: 01/14/2019 10:55 AM   Modules accepted: Orders

## 2019-01-14 NOTE — Patient Instructions (Signed)
Medication Instructions:  Your physician recommends that you continue on your current medications as directed. Please refer to the Current Medication list given to you today.  If you need a refill on your cardiac medications before your next appointment, please call your pharmacy.   Lab work: Your physician recommends that you return for lab work: bmp, fasting lipids, a1c  If you have labs (blood work) drawn today and your tests are completely normal, you will receive your results only by: Marland Kitchen MyChart Message (if you have MyChart) OR . A paper copy in the mail If you have any lab test that is abnormal or we need to change your treatment, we will call you to review the results.  Testing/Procedures: None.   Follow-Up: At [facility], you and your health needs are our priority.  As part of our continuing mission to provide you with exceptional heart care, we have created designated Provider Care Teams.  These Care Teams include your primary Cardiologist (physician) and Advanced Practice Providers (APPs -  Physician Assistants and Nurse Practitioners) who all work together to provide you with the care you need, when you need it. You will need a follow up appointment in 5 months.  Please call our office 2 months in advance to schedule this appointment.  You may see No primary care provider on file. or another member of our [employer] Provider Team in [place]: Shirlee More, MD . Jyl Heinz, MD  Any Other Special Instructions Will Be Listed Below (If Applicable).

## 2019-01-14 NOTE — Progress Notes (Signed)
Virtual Visit via Video Note   This visit type was conducted due to national recommendations for restrictions regarding the COVID-19 Pandemic (e.g. social distancing) in an effort to limit this patient's exposure and mitigate transmission in our community.  Due to her co-morbid illnesses, this patient is at least at moderate risk for complications without adequate follow up.  This format is felt to be most appropriate for this patient at this time.  All issues noted in this document were discussed and addressed.  A limited physical exam was performed with this format.  Please refer to the patient's chart for her consent to telehealth for [facility].  Evaluation Performed:  Follow-up visit  This visit type was conducted due to national recommendations for restrictions regarding the COVID-19 Pandemic (e.g. social distancing).  This format is felt to be most appropriate for this patient at this time.  All issues noted in this document were discussed and addressed.  No physical exam was performed (except for noted visual exam findings with Video Visits).  Please refer to the patient's chart (MyChart message for video visits and phone note for telephone visits) for the patient's consent to telehealth for [facility].  Date:  01/14/2019  ID: Crystal Lyons, DOB May 02, 1966, MRN [id number]   Patient Location: [place] [address]   Provider location:   [place] Office  PCP:  Street, Sharon Mt, MD  Cardiologist:  Jenne Campus, MD     Chief Complaint: Doing fine  History of Present Illness:    Crystal Lyons is a 53 y.o. female  who presents via audio/video conferencing for a telehealth visit today.  With sinus tachycardia, hypertension, dyslipidemia, smoking.  We do have tele-visit today.  About a month ago she ended up having surgery done on her incisional hernia.  Did well recovering overall.  Still trying to be active and walk a lot.   Sadly she still continue to smoke 1-1/2 to 2 packs/day.   The patient does not have symptoms concerning for COVID-19 infection (fever, chills, cough, or new SHORTNESS OF BREATH).    Prior CV studies:   The following studies were reviewed today:       Past Medical History:  Diagnosis Date  . Anxiety   . Atypical chest pain   . Barrett esophagus   . Carpal tunnel syndrome    right and left wrists  . Dysrhythmia   . GERD (gastroesophageal reflux disease)   . Headache(784.0) 03/09/2014  . Heart murmur   . Hiatal hernia   . Hyperlipidemia   . Hypertension   . Osteopenia   . Seizures ([place]) 09/27/2012  . Shingles   . Sleep apnea   . Sleep disorder 09/27/2012    Past Surgical History:  Procedure Laterality Date  . BRAIN SURGERY  01-06-85  . BRAIN SURGERY Right 1986   "right temporal lobe"  . BREAST BIOPSY    . CHOLECYSTECTOMY    . CYST REMOVAL TRUNK    . ears    . EYE SURGERY     cyst removed  . GALLBLADDER SURGERY N/A 2009  . NECK SURGERY    . SHOULDER SURGERY Bilateral 2009, 2010  . VULVECTOMY N/A 07/04/2018   Procedure: WIDE EXCISION VULVECTOMY;  Surgeon: Vanessa Kick, MD;  Location: [facility];  Service: Gynecology;  Laterality: N/A;     Current Meds  Medication Sig  . albuterol (PROVENTIL HFA;VENTOLIN HFA) 108 (90 Base) MCG/ACT inhaler Inhale 2 puffs into  the lungs every 6 (six) hours as needed for wheezing or shortness of breath.  . ALPRAZolam (XANAX) 0.5 MG tablet TAKE 1 TABLET BY MOUTH 3 TIMES DAILY AS NEEDED  . famotidine (PEPCID) 40 MG tablet Take 40 mg by mouth every other day.  Marland Kitchen HYDROcodone-acetaminophen (NORCO/VICODIN) 5-325 MG per tablet Take 1 tablet by mouth 4 (four) times daily.   . hydrOXYzine (ATARAX/VISTARIL) 25 MG tablet Take 25 mg by mouth at bedtime as needed (sleep).   . lansoprazole (PREVACID) 30 MG capsule Take 30 mg by mouth daily.   Marland Kitchen NASONEX 50 MCG/ACT nasal spray Place 2 sprays into the nose daily.   . pravastatin (PRAVACHOL) 20 MG tablet  Take 1 tablet (20 mg total) by mouth every evening.  . promethazine (PHENERGAN) 12.5 MG tablet Take 12.5 mg by mouth every 8 (eight) hours as needed for nausea or vomiting.   Marland Kitchen spironolactone-hydrochlorothiazide (ALDACTAZIDE) 25-25 MG tablet Take 1 tablet by mouth daily.  Marland Kitchen tiotropium (SPIRIVA HANDIHALER) 18 MCG inhalation capsule Place 18 mcg into inhaler and inhale daily.   Marland Kitchen tiZANidine (ZANAFLEX) 4 MG tablet Take 4 mg by mouth every 8 (eight) hours as needed for muscle spasms.   Marland Kitchen VIVELLE-DOT 0.05 MG/24HR Place 1 patch onto the skin 2 (two) times a week.       Family History: The patient's family history includes Cancer in her father and mother; Cirrhosis in her sister; Diabetes in her father; Heart failure in her father; Hypertension in her father and mother; Rheum arthritis in her mother; Stroke in her mother; Thyroid disease in her mother.   ROS:   Please see the history of present illness.     All other systems reviewed and are negative.   Labs/Other Tests and Data Reviewed:     Recent Labs: 06/28/2018: BUN 10; Creatinine, Ser 1.05; Hemoglobin 15.1; Platelets 333; Potassium 4.6; Sodium 138  Recent Lipid Panel    Component Value Date/Time   CHOL 245 (H) 03/18/2018 1619   TRIG 258 (H) 03/18/2018 1619   HDL 32 (L) 03/18/2018 1619   CHOLHDL 7.7 (H) 03/18/2018 1619   LDLCALC 161 (H) 03/18/2018 1619      Exam:    Vital Signs:  BP 121/75   Pulse 84   Wt 131 lb (59.4 kg)   BMI 24.35 kg/m     Wt Readings from Last 3 Encounters:  01/14/19 131 lb (59.4 kg)  08/16/18 145 lb 6.4 oz (66 kg)  08/01/18 142 lb (64.4 kg)     Well nourished, well developed in no acute distress. Alert oriented x3 with talking of the video link.  She is asymptomatic without any distress at the time of my interview  Diagnosis for this visit:   1. Essential hypertension   2. Dyslipidemia   3. Smoking   4. Sinus tachycardia      ASSESSMENT & PLAN:    1.  Essential hypertension blood  pressure controlled continue present management. 2.  Dyslipidemia we will ask her to get fasting blood profile done.  She takes only twice a week pravastatin.  It is most likely not sufficient. 3.  Smoking we spent at least 5 minutes talking about this and I strongly recommended to quit. 4.  Sinus tachycardia echocardiogram showed normal left ventricle ejection fraction.  We will continue monitoring.  COVID-19 Education: The signs and symptoms of COVID-19 were discussed with the patient and how to seek care for testing (follow up with PCP or arrange E-visit).  The importance  of social distancing was discussed today.  Patient Risk:   After full review of this patients clinical status, I feel that they are at least moderate risk at this time.  Time:   Today, I have spent 15 minutes with the patient with telehealth technology discussing pt health issues.  I spent 5 minutes reviewing her chart before the visit.  Visit was finished at 10:40 AM.    Medication Adjustments/Labs and Tests Ordered: Current medicines are reviewed at length with the patient today.  Concerns regarding medicines are outlined above.  No orders of the defined types were placed in this encounter.  Medication changes: No orders of the defined types were placed in this encounter.    Disposition: Follow-up in 5 months  Signed, Park Liter, MD, [facility] 01/14/2019 10:40 AM    [place]

## 2019-01-20 DIAGNOSIS — Z131 Encounter for screening for diabetes mellitus: Secondary | ICD-10-CM | POA: Diagnosis not present

## 2019-01-20 DIAGNOSIS — I10 Essential (primary) hypertension: Secondary | ICD-10-CM | POA: Diagnosis not present

## 2019-01-20 DIAGNOSIS — E785 Hyperlipidemia, unspecified: Secondary | ICD-10-CM | POA: Diagnosis not present

## 2019-01-21 ENCOUNTER — Other Ambulatory Visit: Payer: Self-pay | Admitting: Obstetrics and Gynecology

## 2019-01-21 DIAGNOSIS — L859 Epidermal thickening, unspecified: Secondary | ICD-10-CM | POA: Diagnosis not present

## 2019-01-21 DIAGNOSIS — N9089 Other specified noninflammatory disorders of vulva and perineum: Secondary | ICD-10-CM | POA: Diagnosis not present

## 2019-01-21 DIAGNOSIS — N6320 Unspecified lump in the left breast, unspecified quadrant: Secondary | ICD-10-CM

## 2019-01-21 LAB — HEMOGLOBIN A1C
Est. average glucose Bld gHb Est-mCnc: 108 mg/dL
Hgb A1c MFr Bld: 5.4 % (ref 4.8–5.6)

## 2019-01-21 LAB — BASIC METABOLIC PANEL
BUN/Creatinine Ratio: 19 (ref 9–23)
BUN: 19 mg/dL (ref 6–24)
CO2: 23 mmol/L (ref 20–29)
Calcium: 9.5 mg/dL (ref 8.7–10.2)
Chloride: 105 mmol/L (ref 96–106)
Creatinine, Ser: 0.99 mg/dL (ref 0.57–1.00)
GFR calc Af Amer: 76 mL/min/{1.73_m2} (ref >59)
GFR calc non Af Amer: 66 mL/min/{1.73_m2} (ref >59)
Glucose: 87 mg/dL (ref 65–99)
Potassium: 4.2 mmol/L (ref 3.5–5.2)
Sodium: 140 mmol/L (ref 134–144)

## 2019-01-21 LAB — LIPID PANEL
Chol/HDL Ratio: 6.4 ratio — ABNORMAL HIGH (ref 0.0–4.4)
Cholesterol, Total: 198 mg/dL (ref 100–199)
HDL: 31 mg/dL — ABNORMAL LOW (ref >39)
LDL Calculated: 95 mg/dL (ref 0–99)
Triglycerides: 362 mg/dL — ABNORMAL HIGH (ref 0–149)
VLDL Cholesterol Cal: 72 mg/dL — ABNORMAL HIGH (ref 5–40)

## 2019-01-28 ENCOUNTER — Other Ambulatory Visit: Payer: Self-pay

## 2019-01-28 ENCOUNTER — Ambulatory Visit
Admission: RE | Admit: 2019-01-28 | Discharge: 2019-01-28 | Disposition: A | Discharge status: Home / Self Care | Payer: Medicare HMO | Source: Ambulatory Visit | Attending: Obstetrics and Gynecology | Admitting: Obstetrics and Gynecology

## 2019-01-28 DIAGNOSIS — N6320 Unspecified lump in the left breast, unspecified quadrant: Secondary | ICD-10-CM

## 2019-01-28 DIAGNOSIS — N6489 Other specified disorders of breast: Secondary | ICD-10-CM | POA: Diagnosis not present

## 2019-01-28 DIAGNOSIS — R922 Inconclusive mammogram: Secondary | ICD-10-CM | POA: Diagnosis not present

## 2019-01-29 DIAGNOSIS — M5137 Other intervertebral disc degeneration, lumbosacral region: Secondary | ICD-10-CM | POA: Diagnosis not present

## 2019-01-29 DIAGNOSIS — M961 Postlaminectomy syndrome, not elsewhere classified: Secondary | ICD-10-CM | POA: Diagnosis not present

## 2019-01-29 DIAGNOSIS — M5030 Other cervical disc degeneration, unspecified cervical region: Secondary | ICD-10-CM | POA: Diagnosis not present

## 2019-01-29 DIAGNOSIS — M4722 Other spondylosis with radiculopathy, cervical region: Secondary | ICD-10-CM | POA: Diagnosis not present

## 2019-01-30 ENCOUNTER — Ambulatory Visit: Payer: Medicare HMO | Admitting: Neurology

## 2019-02-19 ENCOUNTER — Ambulatory Visit: Payer: Medicare HMO | Admitting: Neurology

## 2019-03-03 NOTE — Progress Notes (Signed)
PATIENT: Crystal Lyons DOB: 1965-11-26  REASON FOR VISIT: follow up HISTORY FROM: patient  HISTORY OF PRESENT ILLNESS: Today 03/04/19  Crystal Lyons is a 53 year old female with history of seizure and headache.  She was last seen in February 2020, complaining of a coital headache.  The headache occurred after she had a precancerous lesion removed from her vulva.  She developed a feeling of orgasm, resulting in left-sided headache.  MRI of the brain and MRA of the head was normal.  Blood work was unremarkable.  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  She has been able to remain off all of her seizure medications. She has problems with anxiety.  She reports the sensation of orgasm, resulting in left-sided headache is much less frequent.  She says it may occur 1-3 times a month.  It lasts less than 1 minute.  She has since had another biopsy to the area.  Her gynecologist told her she may have the pleasure sensation as a lasting side effect.  She does report history of chronic daily headaches.  She has tried several preventative medications in the past.  She has chronic neck pain, has deferred MRI of the cervical spine in the past.  She does report significant stress, loss of loved ones.  She is prescribed Xanax 0.5 mg, up to 3 times daily for anxiety.  She reports these help to keep the seizures away.  She does report history of memory disturbance. She presents today for follow-up accompanied by her mother.  HISTORY  08/01/2018 Dr. Orlando Penner. 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  The patient has been able to come off of all of her seizure medications.  She has had some problems with headaches that are relatively frequent, she has also had some problems with irritability and mild memory  issues.  The patient comes in today with a new problem.  The patient had surgical resection of a precancerous lesion from the vulva about 1 month ago, shortly thereafter, the patient began having multiple orgasms, and with each orgasm she would have left-sided headaches that would start in the front of the head and shoot backwards to the back of the head.  The headaches would be very sharp but quite brief.  The patient has noted some gradual reduction in the frequency of the headaches as time has gone on.  The patient indicates that this is different from her usual headache.  She reports no new numbness, weakness, balance problems, or difficulty controlling the bowels or the bladder.  She does have some underlying nausea.  She is sent to this office for further evaluation.  She denies any visual disturbances with the headache  REVIEW OF SYSTEMS: Out of a complete 14 system review of symptoms, the patient complains only of the following symptoms, and all other reviewed systems are negative.  Headache, memory loss  ALLERGIES: Allergies  Allergen Reactions  . Adenosine Anaphylaxis    Heart block  . Azithromycin Swelling  . Aleve [Naproxen Sodium]   . Feldene [Piroxicam]   . Keflex [Cephalexin]   . Mobic [Meloxicam]   . Neurontin [Gabapentin]   . Other     Any anti-inflammatory  . Penicillins Other (See Comments)    Welts DID THE REACTION INVOLVE: Swelling of the face/tongue/throat, SOB, or  low BP? Unknown Sudden or severe rash/hives, skin peeling, or the inside of the mouth or nose? Unknown Did it require medical treatment? Unknown When did it last happen?Childhood allergy If all above answers are "NO", may proceed with cephalosporin use.   . Tape     Medical adhesive tape  . Vioxx [Rofecoxib]     HOME MEDICATIONS: Outpatient Medications Prior to Visit  Medication Sig Dispense Refill  . albuterol (PROVENTIL HFA;VENTOLIN HFA) 108 (90 Base) MCG/ACT inhaler Inhale 2 puffs into the  lungs every 6 (six) hours as needed for wheezing or shortness of breath.    . ALPRAZolam (XANAX) 0.5 MG tablet TAKE 1 TABLET BY MOUTH 3 TIMES DAILY AS NEEDED 90 tablet 3  . famotidine (PEPCID) 40 MG tablet Take 40 mg by mouth every other day.    Marland Kitchen HYDROcodone-acetaminophen (NORCO/VICODIN) 5-325 MG per tablet Take 1 tablet by mouth 4 (four) times daily.     . hydrOXYzine (ATARAX/VISTARIL) 25 MG tablet Take 25 mg by mouth at bedtime as needed (sleep).     . lansoprazole (PREVACID) 30 MG capsule Take 30 mg by mouth daily.     Marland Kitchen NASONEX 50 MCG/ACT nasal spray Place 2 sprays into the nose daily.     . pravastatin (PRAVACHOL) 20 MG tablet Take 1 tablet (20 mg total) by mouth every evening. 90 tablet 3  . promethazine (PHENERGAN) 12.5 MG tablet Take 12.5 mg by mouth every 8 (eight) hours as needed for nausea or vomiting.     Marland Kitchen spironolactone-hydrochlorothiazide (ALDACTAZIDE) 25-25 MG tablet Take 1 tablet by mouth daily.    Marland Kitchen tiotropium (SPIRIVA HANDIHALER) 18 MCG inhalation capsule Place 18 mcg into inhaler and inhale daily.     Marland Kitchen tiZANidine (ZANAFLEX) 4 MG tablet Take 4 mg by mouth every 8 (eight) hours as needed for muscle spasms.     Marland Kitchen VIVELLE-DOT 0.05 MG/24HR Place 1 patch onto the skin 2 (two) times a week.      No facility-administered medications prior to visit.     PAST MEDICAL HISTORY: Past Medical History:  Diagnosis Date  . Anxiety   . Atypical chest pain   . Barrett esophagus   . Carpal tunnel syndrome    right and left wrists  . Dysrhythmia   . GERD (gastroesophageal reflux disease)   . Headache(784.0) 03/09/2014  . Heart murmur   . Hiatal hernia   . Hyperlipidemia   . Hypertension   . Osteopenia   . Seizures ([place]) 09/27/2012  . Shingles   . Sleep apnea   . Sleep disorder 09/27/2012    PAST SURGICAL HISTORY: Past Surgical History:  Procedure Laterality Date  . BRAIN SURGERY  01-06-85  . BRAIN SURGERY Right 1986   "right temporal lobe"  . BREAST BIOPSY    .  CHOLECYSTECTOMY    . CYST REMOVAL TRUNK    . ears    . EYE SURGERY     cyst removed  . GALLBLADDER SURGERY N/A 2009  . NECK SURGERY    . SHOULDER SURGERY Bilateral 2009, 2010  . VULVECTOMY N/A 07/04/2018   Procedure: WIDE EXCISION VULVECTOMY;  Surgeon: Vanessa Kick, MD;  Location: [facility];  Service: Gynecology;  Laterality: N/A;    FAMILY HISTORY: Family History  Problem Relation Age of Onset  . Hypertension Father   . Cancer Father   . Diabetes Father   . Heart failure Father   . Cancer Mother   . Thyroid disease Mother   . Rheum arthritis Mother   .  Stroke Mother   . Hypertension Mother   . Cirrhosis Sister     SOCIAL HISTORY: Social History   Socioeconomic History  . Marital status: Single    Spouse name: Not on file  . Number of children: 0  . Years of education: 8  . Highest education level: Not on file  Occupational History  . Occupation: Disabled  Social Needs  . Financial resource strain: Not on file  . Food insecurity    Worry: Not on file    Inability: Not on file  . Transportation needs    Medical: Not on file    Non-medical: Not on file  Tobacco Use  . Smoking status: Current Every Day Smoker    Packs/day: 1.50    Types: Cigarettes  . Smokeless tobacco: Former Systems developer    Types: Chew  Substance and Sexual Activity  . Alcohol use: No  . Drug use: No  . Sexual activity: Not on file  Lifestyle  . Physical activity    Days per week: Not on file    Minutes per session: Not on file  . Stress: Not on file  Relationships  . Social Herbalist on phone: Not on file    Gets together: Not on file    Attends religious service: Not on file    Active member of club or organization: Not on file    Attends meetings of clubs or organizations: Not on file    Relationship status: Not on file  . Intimate partner violence    Fear of current or ex partner: Not on file    Emotionally abused: Not on file    Physically abused: Not on file    Forced  sexual activity: Not on file  Other Topics Concern  . Not on file  Social History Narrative   Patient lives at home mother    Patient has no children.    Patient is single.    Patient has a 8th grade education.    Patient is disabled          PHYSICAL EXAM  There were no vitals filed for this visit. There is no height or weight on file to calculate BMI.  Generalized: Well developed, in no acute distress   Neurological examination  Mentation: Alert oriented to time, place, history taking. Follows all commands speech and language fluent Cranial nerve II-XII: Pupils were equal round reactive to light. Extraocular movements were full, visual field were full on confrontational test. Facial sensation and strength were normal.  Head turning and shoulder shrug  were normal and symmetric. Motor: The motor testing reveals 5 over 5 strength of all 4 extremities. Good symmetric motor tone is noted throughout.  Sensory: Sensory testing is intact to soft touch on all 4 extremities. No evidence of extinction is noted.  Coordination: Cerebellar testing reveals good finger-nose-finger and heel-to-shin bilaterally.  Gait and station: Gait is normal. Tandem gait is normal. Romberg is negative. No drift is seen.  Reflexes: Deep tendon reflexes are symmetric and normal bilaterally.   DIAGNOSTIC DATA (LABS, IMAGING, TESTING) - I reviewed patient records, labs, notes, testing and imaging myself where available.  Lab Results  Component Value Date   WBC 9.3 06/28/2018   HGB 15.1 (H) 06/28/2018   HCT 45.4 06/28/2018   MCV 101.3 (H) 06/28/2018   PLT 333 06/28/2018      Component Value Date/Time   NA 140 01/20/2019 1126   K 4.2 01/20/2019 1126  CL 105 01/20/2019 1126   CO2 23 01/20/2019 1126   GLUCOSE 87 01/20/2019 1126   GLUCOSE 83 06/28/2018 1459   BUN 19 01/20/2019 1126   CREATININE 0.99 01/20/2019 1126   CALCIUM 9.5 01/20/2019 1126   PROT 6.6 03/09/2014 1440   ALBUMIN 4.3 03/09/2014 1440    AST 16 03/09/2014 1440   ALT 12 03/09/2014 1440   ALKPHOS 70 03/09/2014 1440   BILITOT 0.3 03/09/2014 1440   GFRNONAA 66 01/20/2019 1126   GFRAA 76 01/20/2019 1126   Lab Results  Component Value Date   CHOL 198 01/20/2019   HDL 31 (L) 01/20/2019   LDLCALC 95 01/20/2019   TRIG 362 (H) 01/20/2019   CHOLHDL 6.4 (H) 01/20/2019   Lab Results  Component Value Date   HGBA1C 5.4 01/20/2019   Lab Results  Component Value Date   VITAMINB12 312 08/01/2018   Lab Results  Component Value Date   TSH 0.730 03/09/2014    ASSESSMENT AND PLAN 53 y.o. year old female  has a past medical history of Anxiety, Atypical chest pain, Barrett esophagus, Carpal tunnel syndrome, Dysrhythmia, GERD (gastroesophageal reflux disease), Headache(784.0) (03/09/2014), Heart murmur, Hiatal hernia, Hyperlipidemia, Hypertension, Osteopenia, Seizures ([employer]) (09/27/2012), Shingles, Sleep apnea, and Sleep disorder (09/27/2012). here with:  1.  Coital headache -Much less frequent, intense -Gynecologist reported orgasm sensation may be lasting side effect as result of biopsy -Discussed could treat with propanolol, indomethacin, she does not feel significant enough to treat at this time 2.  Chronic migraine headache -Not interested in daily preventative medications, in the past she has tried nortriptyline, Zonegran -She has refused MRI cervical spine in the past, could be source of her headaches 3.  Anxiety -Has tried BuSpar, Zoloft, nortriptyline -Is prescribed Xanax 0.5 mg up to 3 times daily as needed for anxiety (she says keeps the seizures away) 4.  Reported memory disturbance -We will follow over time, formal neuropsychological evaluation may be helpful in the future -MRI of the brain was stable, unremarkable (report attached above) -She will follow-up in 6 months or sooner if needed  I spent 15 minutes with the patient. 50% of this time was spent discussing her plan of care.   Butler Denmark, AGNP-C, DNP  03/04/2019, 12:24 PM [facility] [address], [place], [place] [address] [phone]

## 2019-03-04 ENCOUNTER — Ambulatory Visit (INDEPENDENT_AMBULATORY_CARE_PROVIDER_SITE_OTHER): Payer: Medicare HMO | Admitting: Neurology

## 2019-03-04 ENCOUNTER — Telehealth: Payer: Self-pay | Admitting: Neurology

## 2019-03-04 ENCOUNTER — Encounter: Payer: Self-pay | Admitting: Neurology

## 2019-03-04 ENCOUNTER — Other Ambulatory Visit: Payer: Self-pay

## 2019-03-04 VITALS — BP 124/72 | HR 73 | Temp 98.0°F | Ht 61.5 in | Wt 134.2 lb

## 2019-03-04 DIAGNOSIS — G40909 Epilepsy, unspecified, not intractable, without status epilepticus: Secondary | ICD-10-CM

## 2019-03-04 DIAGNOSIS — R51 Headache: Secondary | ICD-10-CM

## 2019-03-04 DIAGNOSIS — R519 Headache, unspecified: Secondary | ICD-10-CM

## 2019-03-04 MED ORDER — ALPRAZOLAM 0.5 MG PO TABS: 0.5000 mg | ORAL_TABLET | Freq: Three times a day (TID) | ORAL | 3 refills | Status: DC | PRN

## 2019-03-04 NOTE — Addendum Note (Signed)
Addended by: Kathrynn Ducking on: 03/04/2019 04:39 PM   Modules accepted: Orders

## 2019-03-04 NOTE — Progress Notes (Signed)
I have read the note, and I agree with the clinical assessment and plan.  Crystal Lyons   

## 2019-03-04 NOTE — Telephone Encounter (Signed)
I saw this patient today. Looks like Dr. Jannifer Franklin has been prescribing Xanax for anxiety, she says keeps her seizures away. Can you check if she is due for this? Dr. Jannifer Franklin mentioned he would fill for her if so.

## 2019-03-04 NOTE — Telephone Encounter (Signed)
[place] drug registry has been verified. Last refill was 01/17/2019 # 90 for a 30 day supply provided by Dr. Jannifer Franklin.

## 2019-03-10 DIAGNOSIS — F17210 Nicotine dependence, cigarettes, uncomplicated: Secondary | ICD-10-CM | POA: Diagnosis not present

## 2019-03-10 DIAGNOSIS — F17200 Nicotine dependence, unspecified, uncomplicated: Secondary | ICD-10-CM | POA: Diagnosis not present

## 2019-03-10 DIAGNOSIS — Z79899 Other long term (current) drug therapy: Secondary | ICD-10-CM | POA: Diagnosis not present

## 2019-03-10 DIAGNOSIS — R739 Hyperglycemia, unspecified: Secondary | ICD-10-CM | POA: Diagnosis not present

## 2019-03-10 DIAGNOSIS — I10 Essential (primary) hypertension: Secondary | ICD-10-CM | POA: Diagnosis not present

## 2019-03-10 DIAGNOSIS — E782 Mixed hyperlipidemia: Secondary | ICD-10-CM | POA: Diagnosis not present

## 2019-03-10 DIAGNOSIS — E663 Overweight: Secondary | ICD-10-CM | POA: Diagnosis not present

## 2019-03-10 DIAGNOSIS — Z6825 Body mass index (BMI) 25.0-25.9, adult: Secondary | ICD-10-CM | POA: Diagnosis not present

## 2019-04-01 DIAGNOSIS — H5203 Hypermetropia, bilateral: Secondary | ICD-10-CM | POA: Diagnosis not present

## 2019-04-02 ENCOUNTER — Other Ambulatory Visit: Payer: Self-pay | Admitting: Cardiology

## 2019-04-17 DIAGNOSIS — N762 Acute vulvitis: Secondary | ICD-10-CM | POA: Diagnosis not present

## 2019-04-30 DIAGNOSIS — M810 Age-related osteoporosis without current pathological fracture: Secondary | ICD-10-CM | POA: Diagnosis not present

## 2019-04-30 DIAGNOSIS — Z7189 Other specified counseling: Secondary | ICD-10-CM | POA: Diagnosis not present

## 2019-05-01 DIAGNOSIS — M4722 Other spondylosis with radiculopathy, cervical region: Secondary | ICD-10-CM | POA: Diagnosis not present

## 2019-05-01 DIAGNOSIS — Z5181 Encounter for therapeutic drug level monitoring: Secondary | ICD-10-CM

## 2019-05-01 DIAGNOSIS — Z01810 Encounter for preprocedural cardiovascular examination: Secondary | ICD-10-CM | POA: Insufficient documentation

## 2019-05-01 DIAGNOSIS — Z79899 Other long term (current) drug therapy: Secondary | ICD-10-CM | POA: Insufficient documentation

## 2019-05-01 DIAGNOSIS — M5137 Other intervertebral disc degeneration, lumbosacral region: Secondary | ICD-10-CM | POA: Diagnosis not present

## 2019-05-01 DIAGNOSIS — M961 Postlaminectomy syndrome, not elsewhere classified: Secondary | ICD-10-CM | POA: Diagnosis not present

## 2019-05-01 HISTORY — DX: Encounter for therapeutic drug level monitoring: Z51.81

## 2019-05-02 DIAGNOSIS — Z79899 Other long term (current) drug therapy: Secondary | ICD-10-CM | POA: Diagnosis not present

## 2019-05-20 DIAGNOSIS — I10 Essential (primary) hypertension: Secondary | ICD-10-CM | POA: Diagnosis not present

## 2019-05-20 DIAGNOSIS — K625 Hemorrhage of anus and rectum: Secondary | ICD-10-CM | POA: Diagnosis not present

## 2019-05-20 DIAGNOSIS — K5904 Chronic idiopathic constipation: Secondary | ICD-10-CM | POA: Diagnosis not present

## 2019-05-20 DIAGNOSIS — K219 Gastro-esophageal reflux disease without esophagitis: Secondary | ICD-10-CM | POA: Diagnosis not present

## 2019-05-20 DIAGNOSIS — R1032 Left lower quadrant pain: Secondary | ICD-10-CM | POA: Diagnosis not present

## 2019-05-29 ENCOUNTER — Other Ambulatory Visit: Payer: Self-pay | Admitting: Obstetrics and Gynecology

## 2019-05-29 DIAGNOSIS — Z1231 Encounter for screening mammogram for malignant neoplasm of breast: Secondary | ICD-10-CM

## 2019-06-02 DIAGNOSIS — R1013 Epigastric pain: Secondary | ICD-10-CM | POA: Diagnosis not present

## 2019-06-02 DIAGNOSIS — K2270 Barrett's esophagus without dysplasia: Secondary | ICD-10-CM | POA: Diagnosis not present

## 2019-06-02 DIAGNOSIS — K313 Pylorospasm, not elsewhere classified: Secondary | ICD-10-CM | POA: Diagnosis not present

## 2019-06-02 DIAGNOSIS — R1033 Periumbilical pain: Secondary | ICD-10-CM | POA: Diagnosis not present

## 2019-06-04 ENCOUNTER — Ambulatory Visit (INDEPENDENT_AMBULATORY_CARE_PROVIDER_SITE_OTHER): Payer: Medicare HMO | Admitting: Cardiology

## 2019-06-04 ENCOUNTER — Encounter: Payer: Self-pay | Admitting: Cardiology

## 2019-06-04 ENCOUNTER — Encounter: Payer: Self-pay | Admitting: Emergency Medicine

## 2019-06-04 ENCOUNTER — Other Ambulatory Visit: Payer: Self-pay

## 2019-06-04 VITALS — BP 130/74 | HR 87 | Ht 61.5 in | Wt 131.6 lb

## 2019-06-04 DIAGNOSIS — R0789 Other chest pain: Secondary | ICD-10-CM

## 2019-06-04 DIAGNOSIS — I10 Essential (primary) hypertension: Secondary | ICD-10-CM | POA: Diagnosis not present

## 2019-06-04 DIAGNOSIS — E785 Hyperlipidemia, unspecified: Secondary | ICD-10-CM

## 2019-06-04 DIAGNOSIS — R000 Tachycardia, unspecified: Secondary | ICD-10-CM

## 2019-06-04 NOTE — Patient Instructions (Signed)
Medication Instructions:  Your physician recommends that you continue on your current medications as directed. Please refer to the Current Medication list given to you today.  *If you need a refill on your cardiac medications before your next appointment, please call your pharmacy*  Lab Work: Non.  If you have labs (blood work) drawn today and your tests are completely normal, you will receive your results only by: Marland Kitchen MyChart Message (if you have MyChart) OR . A paper copy in the mail If you have any lab test that is abnormal or we need to change your treatment, we will call you to review the results.  Testing/Procedures: Your physician has requested that you have an echocardiogram. Echocardiography is a painless test that uses sound waves to create images of your heart. It provides your doctor with information about the size and shape of your heart and how well your heart's chambers and valves are working. This procedure takes approximately one hour. There are no restrictions for this procedure.    Follow-Up: At [facility], you and your health needs are our priority.  As part of our continuing mission to provide you with exceptional heart care, we have created designated Provider Care Teams.  These Care Teams include your primary Cardiologist (physician) and Advanced Practice Providers (APPs -  Physician Assistants and Nurse Practitioners) who all work together to provide you with the care you need, when you need it.  Your next appointment:   5 month(s)  The format for your next appointment:   In Person  Provider:   Jenne Campus, MD  Other Instructions   Echocardiogram An echocardiogram is a procedure that uses painless sound waves (ultrasound) to produce an image of the heart. Images from an echocardiogram can provide important information about:  Signs of coronary artery disease (CAD).  Aneurysm detection. An aneurysm is a weak or damaged part of an artery wall that  bulges out from the normal force of blood pumping through the body.  Heart size and shape. Changes in the size or shape of the heart can be associated with certain conditions, including heart failure, aneurysm, and CAD.  Heart muscle function.  Heart valve function.  Signs of a past heart attack.  Fluid buildup around the heart.  Thickening of the heart muscle.  A tumor or infectious growth around the heart valves. Tell a health care provider about:  Any allergies you have.  All medicines you are taking, including vitamins, herbs, eye drops, creams, and over-the-counter medicines.  Any blood disorders you have.  Any surgeries you have had.  Any medical conditions you have.  Whether you are pregnant or may be pregnant. What are the risks? Generally, this is a safe procedure. However, problems may occur, including:  Allergic reaction to dye (contrast) that may be used during the procedure. What happens before the procedure? No specific preparation is needed. You may eat and drink normally. What happens during the procedure?   An IV tube may be inserted into one of your veins.  You may receive contrast through this tube. A contrast is an injection that improves the quality of the pictures from your heart.  A gel will be applied to your chest.  A wand-like tool (transducer) will be moved over your chest. The gel will help to transmit the sound waves from the transducer.  The sound waves will harmlessly bounce off of your heart to allow the heart images to be captured in real-time motion. The images will be recorded  on a computer. The procedure may vary among health care providers and hospitals. What happens after the procedure?  You may return to your normal, everyday life, including diet, activities, and medicines, unless your health care provider tells you not to do that. Summary  An echocardiogram is a procedure that uses painless sound waves (ultrasound) to produce  an image of the heart.  Images from an echocardiogram can provide important information about the size and shape of your heart, heart muscle function, heart valve function, and fluid buildup around your heart.  You do not need to do anything to prepare before this procedure. You may eat and drink normally.  After the echocardiogram is completed, you may return to your normal, everyday life, unless your health care provider tells you not to do that. This information is not intended to replace advice given to you by your health care provider. Make sure you discuss any questions you have with your health care provider. Document Released: 06/02/2000 Document Revised: 09/26/2018 Document Reviewed: 07/08/2016 Elsevier Patient Education  2020 Elsevier Inc.   

## 2019-06-04 NOTE — Progress Notes (Signed)
Cardiology Office Note:    Date:  06/04/2019   ID:  Crystal Lyons, DOB 02-01-1966, MRN [id number]  PCP:  Street, Crystal Mt, MD  Cardiologist:  Jenne Campus, MD    Referring MD: Street, Crystal Lyons, *   Chief Complaint  Patient presents with  . Follow-up    History of Present Illness:    Crystal Lyons is a 53 y.o. female with past medical history significant for essential hypertension, sinus tachycardia, smoking, dyslipidemia.  She comes today to my for follow-up.  Her father recently died and she is grieving after that however she did not see him for about 4 years before he passed.  Denies having any typical cardiac complaints she did have some abdominal surgery initially she was very happy with this now she is not that much she complained of having some pain in the left lower portion of her abdomen sometimes going towards the chest.  She describes also sharp stabbing sharp-like sensation abdomen.Look very atypical.  Still continue to smoke.  Complain of having exertional shortness of breath  Past Medical History:  Diagnosis Date  . Anxiety   . Atypical chest pain   . Barrett esophagus   . Carpal tunnel syndrome    right and left wrists  . Dysrhythmia   . GERD (gastroesophageal reflux disease)   . Headache(784.0) 03/09/2014  . Heart murmur   . Hiatal hernia   . Hyperlipidemia   . Hypertension   . Osteopenia   . Seizures ([place]) 09/27/2012  . Shingles   . Sleep apnea   . Sleep disorder 09/27/2012    Past Surgical History:  Procedure Laterality Date  . BRAIN SURGERY  01-06-85  . BRAIN SURGERY Right 1986   "right temporal lobe"  . BREAST BIOPSY    . CHOLECYSTECTOMY    . CYST REMOVAL TRUNK    . ears    . EYE SURGERY     cyst removed  . GALLBLADDER SURGERY N/A 2009  . NECK SURGERY    . SHOULDER SURGERY Bilateral 2009, 2010  . VULVECTOMY N/A 07/04/2018   Procedure: WIDE EXCISION VULVECTOMY;  Surgeon: Vanessa Kick, MD;  Location: [facility];  Service: Gynecology;   Laterality: N/A;    Current Medications: Current Meds  Medication Sig  . albuterol (PROVENTIL HFA;VENTOLIN HFA) 108 (90 Base) MCG/ACT inhaler Inhale 2 puffs into the lungs every 6 (six) hours as needed for wheezing or shortness of breath.  . ALPRAZolam (XANAX) 0.5 MG tablet Take 1 tablet (0.5 mg total) by mouth 3 (three) times daily as needed.  . Calcium Polycarbophil (EQUALACTIN PO) Take by mouth.  . dicyclomine (BENTYL) 10 MG capsule Take 10 mg by mouth every 6 (six) hours as needed for spasms.  . famotidine (PEPCID) 40 MG tablet Take 40 mg by mouth every other day.  Marland Kitchen HYDROcodone-acetaminophen (NORCO/VICODIN) 5-325 MG per tablet Take 1 tablet by mouth 4 (four) times daily.   . hydrOXYzine (ATARAX/VISTARIL) 25 MG tablet Take 25 mg by mouth at bedtime as needed (sleep).   . lansoprazole (PREVACID) 30 MG capsule Take 30 mg by mouth daily.   Marland Kitchen NASONEX 50 MCG/ACT nasal spray Place 2 sprays into the nose daily.   . pravastatin (PRAVACHOL) 20 MG tablet TAKE 1 TABLET BY MOUTH EVERY EVENING  . promethazine (PHENERGAN) 12.5 MG tablet Take 12.5 mg by mouth every 8 (eight) hours as needed for nausea or vomiting.   Marland Kitchen spironolactone-hydrochlorothiazide (ALDACTAZIDE) 25-25 MG tablet Take 1 tablet by mouth daily.  Marland Kitchen  tiotropium (SPIRIVA HANDIHALER) 18 MCG inhalation capsule Place 18 mcg into inhaler and inhale daily.   Marland Kitchen tiZANidine (ZANAFLEX) 4 MG tablet Take 4 mg by mouth every 8 (eight) hours as needed for muscle spasms.   Marland Kitchen VIVELLE-DOT 0.05 MG/24HR Place 1 patch onto the skin 2 (two) times a week.      Allergies:   Adenosine, Azithromycin, Aleve [naproxen sodium], Feldene [piroxicam], Keflex [cephalexin], Mobic [meloxicam], Neurontin [gabapentin], Other, Penicillins, Tape, and Vioxx [rofecoxib]   Social History   Socioeconomic History  . Marital status: Single    Spouse name: Not on file  . Number of children: 0  . Years of education: 8  . Highest education level: Not on file  Occupational  History  . Occupation: Disabled  Tobacco Use  . Smoking status: Current Every Day Smoker    Packs/day: 1.50    Types: Cigarettes  . Smokeless tobacco: Former Systems developer    Types: Chew  Substance and Sexual Activity  . Alcohol use: No  . Drug use: No  . Sexual activity: Not on file  Other Topics Concern  . Not on file  Social History Narrative   Patient lives at home mother    Patient has no children.    Patient is single.    Patient has a 8th grade education.    Patient is disabled         Social Determinants of Radio broadcast assistant Strain:   . Difficulty of Paying Living Expenses: Not on file  Food Insecurity:   . Worried About Charity fundraiser in the Last Year: Not on file  . Ran Out of Food in the Last Year: Not on file  Transportation Needs:   . Lack of Transportation (Medical): Not on file  . Lack of Transportation (Non-Medical): Not on file  Physical Activity:   . Days of Exercise per Week: Not on file  . Minutes of Exercise per Session: Not on file  Stress:   . Feeling of Stress : Not on file  Social Connections:   . Frequency of Communication with Friends and Family: Not on file  . Frequency of Social Gatherings with Friends and Family: Not on file  . Attends Religious Services: Not on file  . Active Member of Clubs or Organizations: Not on file  . Attends Archivist Meetings: Not on file  . Marital Status: Not on file     Family History: The patient's family history includes Cancer in her father and mother; Cirrhosis in her sister; Diabetes in her father; Heart failure in her father; Hypertension in her father and mother; Rheum arthritis in her mother; Stroke in her mother; Thyroid disease in her mother. ROS:   Please see the history of present illness.    All 14 point review of systems negative except as described per history of present illness  EKGs/Labs/Other Studies Reviewed:      Recent Labs: 06/28/2018: Hemoglobin 15.1; Platelets  333 01/20/2019: BUN 19; Creatinine, Ser 0.99; Potassium 4.2; Sodium 140  Recent Lipid Panel    Component Value Date/Time   CHOL 198 01/20/2019 1126   TRIG 362 (H) 01/20/2019 1126   HDL 31 (L) 01/20/2019 1126   CHOLHDL 6.4 (H) 01/20/2019 1126   LDLCALC 95 01/20/2019 1126    Physical Exam:    VS:  BP 130/74   Pulse 87   Ht 5' 1.5" (1.562 m)   Wt 131 lb 9.6 oz (59.7 kg)   SpO2 98%  BMI 24.46 kg/m     Wt Readings from Last 3 Encounters:  06/04/19 131 lb 9.6 oz (59.7 kg)  03/04/19 134 lb 3.2 oz (60.9 kg)  01/14/19 131 lb (59.4 kg)     GEN:  Well nourished, well developed in no acute distress HEENT: Normal NECK: No JVD; No carotid bruits LYMPHATICS: No lymphadenopathy CARDIAC: RRR, no murmurs, no rubs, no gallops RESPIRATORY:  Clear to auscultation without rales, wheezing or rhonchi  ABDOMEN: Soft, non-tender, non-distended MUSCULOSKELETAL:  No edema; No deformity  SKIN: Warm and dry LOWER EXTREMITIES: no swelling NEUROLOGIC:  Alert and oriented x 3 PSYCHIATRIC:  Normal affect   ASSESSMENT:    1. Essential hypertension   2. Sinus tachycardia   3. Dyslipidemia   4. Atypical chest pain    PLAN:    In order of problems listed above:  1. Essential hypertension blood pressure appears to be well controlled continue present management. 2. Sinus tachycardia is ongoing problem.  I will ask you to have echocardiogram done to check for left ventricle ejection fraction. 3. Dyslipidemia she is on Pravachol 20 which I will continue.  Last fasting lipid profile that I have is from summer which showed LDL of 95 HDL 36.  We will continue present management since that appears to be the dose that she is able to tolerate. 4. Atypical chest pain doubt very much related to her heart.  We will get echocardiogram and then decide what will be next time. 5.    Medication Adjustments/Labs and Tests Ordered: Current medicines are reviewed at length with the patient today.  Concerns regarding  medicines are outlined above.  No orders of the defined types were placed in this encounter.  Medication changes: No orders of the defined types were placed in this encounter.   Signed, Park Liter, MD, [facility] 06/04/2019 11:23 AM    [place]

## 2019-06-04 NOTE — Addendum Note (Signed)
Addended by: Ashok Norris on: 06/04/2019 11:52 AM   Modules accepted: Orders

## 2019-06-06 ENCOUNTER — Ambulatory Visit (INDEPENDENT_AMBULATORY_CARE_PROVIDER_SITE_OTHER): Payer: Medicare HMO

## 2019-06-06 ENCOUNTER — Other Ambulatory Visit: Payer: Self-pay

## 2019-06-06 DIAGNOSIS — R0789 Other chest pain: Secondary | ICD-10-CM | POA: Diagnosis not present

## 2019-06-06 NOTE — Progress Notes (Unsigned)
Complete echocardiogram has been performed.  Jimmy Talonda Artist RDCS, RVT 

## 2019-06-18 DIAGNOSIS — Z0100 Encounter for examination of eyes and vision without abnormal findings: Secondary | ICD-10-CM | POA: Diagnosis not present

## 2019-06-24 DIAGNOSIS — N901 Moderate vulvar dysplasia: Secondary | ICD-10-CM | POA: Diagnosis not present

## 2019-06-24 DIAGNOSIS — B373 Candidiasis of vulva and vagina: Secondary | ICD-10-CM | POA: Diagnosis not present

## 2019-07-02 DIAGNOSIS — R1013 Epigastric pain: Secondary | ICD-10-CM | POA: Diagnosis not present

## 2019-07-07 DIAGNOSIS — R1010 Upper abdominal pain, unspecified: Secondary | ICD-10-CM | POA: Diagnosis not present

## 2019-07-07 DIAGNOSIS — I700 Atherosclerosis of aorta: Secondary | ICD-10-CM | POA: Diagnosis not present

## 2019-07-07 DIAGNOSIS — R1013 Epigastric pain: Secondary | ICD-10-CM | POA: Diagnosis not present

## 2019-07-15 ENCOUNTER — Telehealth: Payer: Self-pay | Admitting: Cardiology

## 2019-07-15 NOTE — Telephone Encounter (Signed)
New Message     Pt c/o medication issue:  1. Name of Medication: Lipitor   2. How are you currently taking this medication (dosage and times per day)?  40 mg before bed,  is not taking now   3. Are you having a reaction (difficulty breathing--STAT)? No   4. What is your medication issue? Pt is calling and says she was prescribed Lipitor and she doesn't want to take it unless Dr Raliegh Ip says she needs too. She asks that Dr Raliegh Ip reviews her CT scan and call her back    Please call

## 2019-07-15 NOTE — Telephone Encounter (Signed)
Pt is calling to inform Dr. 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  Pt states that her PCP stated she had calcifications of her aorta, and advised her to stop taking Pravastatin Dr. Agustin Cree prescribed, and start taking Lipitor 40 mg po daily. Pt states that before she pursues taking Lipitor 40 mg, she would like for Dr. Agustin Cree to review her abdominal CT (pt states can view through [facility]), and advise if this course of treatment is necessary.  Pt states she will continue her old regimen of pravastatin, until she here's back from Dr. Agustin Cree. Informed the pt that I will route this message to Dr. Agustin Cree and his RN to further review and advise on.  Informed the pt that his RN will follow-up back up with her, once recommendations are provided. Pt verbalized understanding and agrees with this plan.

## 2019-07-16 NOTE — Telephone Encounter (Signed)
Patient notified of information from Dr Agustin Cree.  Patient will try taking Lipitor.  Prescription has been sent by her PCP to pharmacy.

## 2019-07-16 NOTE — Telephone Encounter (Signed)
Ideally, better if she will take Lipitor at 40 mg daily.  If she can tolerate this that will be ideal.  She also need to quit smoking.  Obviously calcification of the artery are worrisome that indicate that she does have atherosclerosis.

## 2019-07-21 ENCOUNTER — Other Ambulatory Visit: Payer: Self-pay | Admitting: Obstetrics and Gynecology

## 2019-07-21 ENCOUNTER — Ambulatory Visit
Admission: RE | Admit: 2019-07-21 | Discharge: 2019-07-21 | Disposition: A | Discharge status: Home / Self Care | Payer: Medicare HMO | Source: Ambulatory Visit | Attending: Obstetrics and Gynecology | Admitting: Obstetrics and Gynecology

## 2019-07-21 ENCOUNTER — Other Ambulatory Visit: Payer: Self-pay

## 2019-07-21 DIAGNOSIS — N63 Unspecified lump in unspecified breast: Secondary | ICD-10-CM

## 2019-07-21 DIAGNOSIS — Z1231 Encounter for screening mammogram for malignant neoplasm of breast: Secondary | ICD-10-CM

## 2019-07-23 ENCOUNTER — Other Ambulatory Visit: Payer: Self-pay

## 2019-07-23 ENCOUNTER — Ambulatory Visit
Admission: RE | Admit: 2019-07-23 | Discharge: 2019-07-23 | Disposition: A | Discharge status: Home / Self Care | Payer: Medicare HMO | Source: Ambulatory Visit | Attending: Obstetrics and Gynecology | Admitting: Obstetrics and Gynecology

## 2019-07-23 DIAGNOSIS — R922 Inconclusive mammogram: Secondary | ICD-10-CM | POA: Diagnosis not present

## 2019-07-23 DIAGNOSIS — N63 Unspecified lump in unspecified breast: Secondary | ICD-10-CM

## 2019-07-23 DIAGNOSIS — N6489 Other specified disorders of breast: Secondary | ICD-10-CM | POA: Diagnosis not present

## 2019-07-23 DIAGNOSIS — N6321 Unspecified lump in the left breast, upper outer quadrant: Secondary | ICD-10-CM | POA: Diagnosis not present

## 2019-07-24 ENCOUNTER — Telehealth: Payer: Self-pay | Admitting: Cardiology
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
## 2019-07-25 NOTE — Telephone Encounter (Signed)
I doubt very much that atorvastatin make her to be agitated.  But if she is willing to try Crestor instead of atorvastatin that we may send 20 mg of Crestor will daily to try.

## 2019-07-25 NOTE — Telephone Encounter (Signed)
Called patient. Informed her of Dr. Wendy Poet recommendation. She is going to try to atorvastatin again this weekend and let us know if it does the same thing to her. If so we will switch to crestor. Will follow up with her on Monday. Patient verbally understands plan.

## 2019-07-29 DIAGNOSIS — M961 Postlaminectomy syndrome, not elsewhere classified: Secondary | ICD-10-CM | POA: Diagnosis not present

## 2019-07-29 DIAGNOSIS — M4722 Other spondylosis with radiculopathy, cervical region: Secondary | ICD-10-CM | POA: Diagnosis not present

## 2019-07-29 DIAGNOSIS — M5137 Other intervertebral disc degeneration, lumbosacral region: Secondary | ICD-10-CM | POA: Diagnosis not present

## 2019-07-29 DIAGNOSIS — G894 Chronic pain syndrome: Secondary | ICD-10-CM | POA: Diagnosis not present

## 2019-07-29 MED ORDER — ROSUVASTATIN CALCIUM 20 MG PO TABS: 20.0000 mg | ORAL_TABLET | Freq: Every day | ORAL | 2 refills | Status: DC

## 2019-07-29 NOTE — Telephone Encounter (Signed)
Patient returning call. She says she has an appointment at 2:00 today and will not be able to take any calls at that time.

## 2019-07-29 NOTE — Telephone Encounter (Signed)
Called patient back. She reports the atorvastatin made her irritated again over the weekend she does not want to take it. I advised her we will switch to crestor 20 mg daily, and she can stop the atorvastatin per Dr. Agustin Cree. She verbaly understood. During the call she reports she has been having off and on chest pain in the middle of her sternum along with nausea. She couldn't tell me how long the pain as been going in I advised her to go to the emergency room to be checked out. She said she would think about it.

## 2019-07-29 NOTE — Telephone Encounter (Signed)
Left message for patient to return call.

## 2019-08-04 DIAGNOSIS — K2270 Barrett's esophagus without dysplasia: Secondary | ICD-10-CM | POA: Diagnosis not present

## 2019-08-04 DIAGNOSIS — R1033 Periumbilical pain: Secondary | ICD-10-CM | POA: Diagnosis not present

## 2019-08-04 DIAGNOSIS — R1013 Epigastric pain: Secondary | ICD-10-CM | POA: Diagnosis not present

## 2019-08-04 DIAGNOSIS — K313 Pylorospasm, not elsewhere classified: Secondary | ICD-10-CM | POA: Diagnosis not present

## 2019-09-01 ENCOUNTER — Encounter: Payer: Self-pay | Admitting: Adult Health

## 2019-09-01 ENCOUNTER — Ambulatory Visit: Payer: Medicare HMO | Admitting: Adult Health

## 2019-09-01 ENCOUNTER — Other Ambulatory Visit: Payer: Self-pay

## 2019-09-01 VITALS — BP 117/81 | HR 87 | Temp 97.2°F | Ht 61.5 in | Wt 132.8 lb

## 2019-09-01 DIAGNOSIS — R569 Unspecified convulsions: Secondary | ICD-10-CM

## 2019-09-01 DIAGNOSIS — F4323 Adjustment disorder with mixed anxiety and depressed mood: Secondary | ICD-10-CM | POA: Diagnosis not present

## 2019-09-01 NOTE — Patient Instructions (Signed)
Your Plan:  Continue xanax twice a day as needed for anxiety  Keep follow-up with PCP If your symptoms worsen or you develop new symptoms please let us know.    Thank you for coming to see [name] at [facility]. I hope we have been able to provide you high quality care today.  You may receive a patient satisfaction survey over the next few weeks. We would appreciate your feedback and comments so that we may continue to improve ourselves and the health of our patients.

## 2019-09-01 NOTE — Progress Notes (Signed)
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  She returns today for follow-up.  She she denies any seizure events.  She states that she will still get a nervousness in the pit of her stomach that she describes as an aura.  She takes Xanax approximately twice a day.  The patient has swelling around the left eye.  She states that she was cleaning out a filter under the house and developed swelling.  Notes that swelling started last week no trauma to the eye.  She has an appointment Thursday to see her PCP.  Denies any changes with her vision.  I did advise the patient that she should see her PCP sooner if she develops new symptoms.  She reports "why? I do not have anything to live for."  States that her father passed away in Mar 06, 2023.  States that she does not have a great relationship with her mother and brother.  She does acknowledge that she is still going through the grieving process and is experiencing some depression.  She reports at times she has had thoughts of suicide but no plan to carry this out.  HISTORY 03/04/19  Crystal Lyons is a 54 year old female with history of seizure and headache.  She was last seen in February 2020, complaining of a coital headache.  The headache occurred after she had a precancerous lesion removed from her vulva.  She developed a feeling of orgasm, resulting in left-sided headache.  MRI of the brain and MRA of the head was normal.  Blood work was unremarkable.  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  She has been able to remain off all of her seizure medications. She has problems with anxiety.  She reports the sensation of orgasm,  resulting in left-sided headache is much less frequent.  She says it may occur 1-3 times a month.  It lasts less than 1 minute.  She has since had another biopsy to the area.  Her gynecologist told her she may have the pleasure sensation as a lasting side effect.  She does report history of chronic daily headaches.  She has tried several preventative medications in the past.  She has chronic neck pain, has deferred MRI of the cervical spine in the past.  She does report significant stress, loss of loved ones.  She is prescribed Xanax 0.5 mg, up to 3 times daily for anxiety.  She reports these help to keep the seizures away.  She does report history of memory disturbance. She presents today for follow-up accompanied by her mother  REVIEW OF SYSTEMS: Out of a complete 14 system review of symptoms, the patient complains only of the following symptoms, and all other reviewed systems are negative.  See HPI  ALLERGIES: Allergies  Allergen Reactions  . Adenosine Anaphylaxis    Heart block  . Azithromycin Swelling  . Aleve [Naproxen Sodium]   . Feldene [Piroxicam]   . Keflex [Cephalexin]   . Mobic [Meloxicam]   . Neurontin [Gabapentin]   . Other     Any anti-inflammatory  . Penicillins Other (See Comments)    Welts DID THE REACTION INVOLVE: Swelling of the face/tongue/throat, SOB, or low BP? Unknown  Sudden or severe rash/hives, skin peeling, or the inside of the mouth or nose? Unknown Did it require medical treatment? Unknown When did it last happen?Childhood allergy If all above answers are "NO", may proceed with cephalosporin use.   . Tape     Medical adhesive tape  . Vioxx [Rofecoxib]     HOME MEDICATIONS: Outpatient Medications Prior to Visit  Medication Sig Dispense Refill  . albuterol (PROVENTIL HFA;VENTOLIN HFA) 108 (90 Base) MCG/ACT inhaler Inhale 2 puffs into the lungs every 6 (six) hours as needed for wheezing or shortness of breath.    . ALPRAZolam (XANAX) 0.5 MG tablet  Take 0.5 mg by mouth 3 (three) times daily as needed for anxiety.    . Calcium Polycarbophil (EQUALACTIN PO) Take by mouth.    . famotidine (PEPCID) 40 MG tablet Take 40 mg by mouth every other day.    Marland Kitchen HYDROcodone-acetaminophen (NORCO/VICODIN) 5-325 MG per tablet Take 1 tablet by mouth 4 (four) times daily.     . hydrOXYzine (ATARAX/VISTARIL) 25 MG tablet Take 25 mg by mouth at bedtime as needed (sleep).     . NASONEX 50 MCG/ACT nasal spray Place 2 sprays into the nose daily.     . promethazine (PHENERGAN) 12.5 MG tablet Take 12.5 mg by mouth every 8 (eight) hours as needed for nausea or vomiting.     . tiotropium (SPIRIVA HANDIHALER) 18 MCG inhalation capsule Place 18 mcg into inhaler and inhale daily.     Marland Kitchen tiZANidine (ZANAFLEX) 4 MG tablet Take 4 mg by mouth every 8 (eight) hours as needed for muscle spasms.     Marland Kitchen VIVELLE-DOT 0.05 MG/24HR Place 1 patch onto the skin 2 (two) times a week.     . lansoprazole (PREVACID) 30 MG capsule Take 30 mg by mouth daily.     Marland Kitchen dicyclomine (BENTYL) 10 MG capsule Take 10 mg by mouth every 6 (six) hours as needed for spasms.    . rosuvastatin (CRESTOR) 20 MG tablet Take 1 tablet (20 mg total) by mouth daily. (Patient not taking: Reported on 09/01/2019) 30 tablet 2  . spironolactone-hydrochlorothiazide (ALDACTAZIDE) 25-25 MG tablet Take 1 tablet by mouth daily.     No facility-administered medications prior to visit.    PAST MEDICAL HISTORY: Past Medical History:  Diagnosis Date  . Anxiety   . Atypical chest pain   . Barrett esophagus   . Carpal tunnel syndrome    right and left wrists  . Dysrhythmia   . GERD (gastroesophageal reflux disease)   . Headache(784.0) 03/09/2014  . Heart murmur   . Hiatal hernia   . Hyperlipidemia   . Hypertension   . Osteopenia   . Seizures ([place]) 09/27/2012  . Shingles   . Sleep apnea   . Sleep disorder 09/27/2012    PAST SURGICAL HISTORY: Past Surgical History:  Procedure Laterality Date  . BRAIN SURGERY   01-06-85  . BRAIN SURGERY Right 1986   "right temporal lobe"  . BREAST BIOPSY    . CHOLECYSTECTOMY    . CYST REMOVAL TRUNK    . ears    . EYE SURGERY     cyst removed  . GALLBLADDER SURGERY N/A 2009  . NECK SURGERY    . SHOULDER SURGERY Bilateral 2009, 2010  . VULVECTOMY N/A 07/04/2018   Procedure: WIDE EXCISION VULVECTOMY;  Surgeon: Vanessa Kick, MD;  Location: [facility];  Service: Gynecology;  Laterality: N/A;    FAMILY HISTORY: Family History  Problem Relation Age of Onset  .  Hypertension Father   . Cancer Father   . Diabetes Father   . Heart failure Father   . Cancer Mother   . Thyroid disease Mother   . Rheum arthritis Mother   . Stroke Mother   . Hypertension Mother   . Cirrhosis Sister     SOCIAL HISTORY: Social History   Socioeconomic History  . Marital status: Single    Spouse name: Not on file  . Number of children: 0  . Years of education: 8  . Highest education level: Not on file  Occupational History  . Occupation: Disabled  Tobacco Use  . Smoking status: Current Every Day Smoker    Packs/day: 1.50    Types: Cigarettes  . Smokeless tobacco: Former Systems developer    Types: Chew  Substance and Sexual Activity  . Alcohol use: No  . Drug use: No  . Sexual activity: Not on file  Other Topics Concern  . Not on file  Social History Narrative   Patient lives at home mother    Patient has no children.    Patient is single.    Patient has a 8th grade education.    Patient is disabled         Social Determinants of Radio broadcast assistant Strain:   . Difficulty of Paying Living Expenses:   Food Insecurity:   . Worried About Charity fundraiser in the Last Year:   . Arboriculturist in the Last Year:   Transportation Needs:   . Film/video editor (Medical):   Marland Kitchen Lack of Transportation (Non-Medical):   Physical Activity:   . Days of Exercise per Week:   . Minutes of Exercise per Session:   Stress:   . Feeling of Stress :   Social Connections:   .  Frequency of Communication with Friends and Family:   . Frequency of Social Gatherings with Friends and Family:   . Attends Religious Services:   . Active Member of Clubs or Organizations:   . Attends Archivist Meetings:   Marland Kitchen Marital Status:   Intimate Partner Violence:   . Fear of Current or Ex-Partner:   . Emotionally Abused:   Marland Kitchen Physically Abused:   . Sexually Abused:       PHYSICAL EXAM  Vitals:   09/01/19 1254  BP: 117/81  Pulse: 87  Temp: (!) 97.2 F (36.2 C)  Weight: 132 lb 12.8 oz (60.2 kg)  Height: 5' 1.5" (1.562 m)   Body mass index is 24.69 kg/m.  Generalized: Well developed, in no acute distress  HEENT: Swelling of the left eyelid extending to the eyebrow  Neurological examination  Mentation: Alert oriented to time, place, history taking. Follows all commands speech and language fluent Cranial nerve II-XII: Extraocular movements were full, visual field were full on confrontational test.  Head turning and shoulder shrug  were normal and symmetric. Motor: The motor testing reveals 5 over 5 strength of all 4 extremities. Good symmetric motor tone is noted throughout.  Sensory: Sensory testing is intact to soft touch on all 4 extremities. No evidence of extinction is noted.  Coordination: Cerebellar testing reveals good finger-nose-finger and heel-to-shin bilaterally.  Gait and station: Gait is normal. Reflexes: Deep tendon reflexes are symmetric and normal bilaterally.   DIAGNOSTIC DATA (LABS, IMAGING, TESTING) - I reviewed patient records, labs, notes, testing and imaging myself where available.  Lab Results  Component Value Date   WBC 9.3 06/28/2018   HGB 15.1 (  H) 06/28/2018   HCT 45.4 06/28/2018   MCV 101.3 (H) 06/28/2018   PLT 333 06/28/2018      Component Value Date/Time   NA 140 01/20/2019 1126   K 4.2 01/20/2019 1126   CL 105 01/20/2019 1126   CO2 23 01/20/2019 1126   GLUCOSE 87 01/20/2019 1126   GLUCOSE 83 06/28/2018 1459   BUN 19  01/20/2019 1126   CREATININE 0.99 01/20/2019 1126   CALCIUM 9.5 01/20/2019 1126   PROT 6.6 03/09/2014 1440   ALBUMIN 4.3 03/09/2014 1440   AST 16 03/09/2014 1440   ALT 12 03/09/2014 1440   ALKPHOS 70 03/09/2014 1440   BILITOT 0.3 03/09/2014 1440   GFRNONAA 66 01/20/2019 1126   GFRAA 76 01/20/2019 1126   Lab Results  Component Value Date   CHOL 198 01/20/2019   HDL 31 (L) 01/20/2019   LDLCALC 95 01/20/2019   TRIG 362 (H) 01/20/2019   CHOLHDL 6.4 (H) 01/20/2019   Lab Results  Component Value Date   HGBA1C 5.4 01/20/2019   Lab Results  Component Value Date   VITAMINB12 312 08/01/2018   Lab Results  Component Value Date   TSH 0.730 03/09/2014      ASSESSMENT AND PLAN 54 y.o. year old female  has a past medical history of Anxiety, Atypical chest pain, Barrett esophagus, Carpal tunnel syndrome, Dysrhythmia, GERD (gastroesophageal reflux disease), Headache(784.0) (03/09/2014), Heart murmur, Hiatal hernia, Hyperlipidemia, Hypertension, Osteopenia, Seizures ([place]) (09/27/2012), Shingles, Sleep apnea, and Sleep disorder (09/27/2012). here with:  1.  Seizures  -Continue Xanax twice a day for anxiety  2.  Depression  -Follow-up with PCP--has an appointment Thursday -Advised if she has any thoughts of harming herself or others she should call 911    I spent 25 minutes of face-to-face and non-face-to-face time with patient.  This included previsit chart review,  order entry, electronic health record documentation, patient education.  Ward Givens, MSN, NP-C 09/01/2019, 1:11 PM [facility] [address] [place], [place] [address] [phone]

## 2019-09-02 ENCOUNTER — Encounter: Payer: Self-pay | Admitting: Adult Health

## 2019-09-02 MED ORDER — ALPRAZOLAM 0.5 MG PO TABS: 0.5000 mg | ORAL_TABLET | Freq: Two times a day (BID) | ORAL | 1 refills | Status: DC | PRN

## 2019-09-02 NOTE — Addendum Note (Signed)
Addended by: Trudie Buckler on: 09/02/2019 03:57 PM   Modules accepted: Orders

## 2019-09-04 DIAGNOSIS — H01004 Unspecified blepharitis left upper eyelid: Secondary | ICD-10-CM | POA: Diagnosis not present

## 2019-09-04 DIAGNOSIS — F17200 Nicotine dependence, unspecified, uncomplicated: Secondary | ICD-10-CM | POA: Diagnosis not present

## 2019-09-04 DIAGNOSIS — F4321 Adjustment disorder with depressed mood: Secondary | ICD-10-CM | POA: Diagnosis not present

## 2019-09-04 DIAGNOSIS — Z23 Encounter for immunization: Secondary | ICD-10-CM | POA: Diagnosis not present

## 2019-09-04 DIAGNOSIS — G40909 Epilepsy, unspecified, not intractable, without status epilepticus: Secondary | ICD-10-CM | POA: Diagnosis not present

## 2019-09-04 DIAGNOSIS — I700 Atherosclerosis of aorta: Secondary | ICD-10-CM | POA: Diagnosis not present

## 2019-09-04 DIAGNOSIS — T466X5A Adverse effect of antihyperlipidemic and antiarteriosclerotic drugs, initial encounter: Secondary | ICD-10-CM | POA: Diagnosis not present

## 2019-09-04 DIAGNOSIS — E782 Mixed hyperlipidemia: Secondary | ICD-10-CM | POA: Diagnosis not present

## 2019-09-04 DIAGNOSIS — F17210 Nicotine dependence, cigarettes, uncomplicated: Secondary | ICD-10-CM | POA: Diagnosis not present

## 2019-09-04 DIAGNOSIS — M791 Myalgia, unspecified site: Secondary | ICD-10-CM | POA: Diagnosis not present

## 2019-09-04 DIAGNOSIS — I10 Essential (primary) hypertension: Secondary | ICD-10-CM | POA: Diagnosis not present

## 2019-09-15 DIAGNOSIS — K219 Gastro-esophageal reflux disease without esophagitis: Secondary | ICD-10-CM | POA: Diagnosis not present

## 2019-09-15 DIAGNOSIS — R1033 Periumbilical pain: Secondary | ICD-10-CM | POA: Diagnosis not present

## 2019-10-27 DIAGNOSIS — Z79899 Other long term (current) drug therapy: Secondary | ICD-10-CM | POA: Diagnosis not present

## 2019-10-27 DIAGNOSIS — M961 Postlaminectomy syndrome, not elsewhere classified: Secondary | ICD-10-CM | POA: Diagnosis not present

## 2019-10-27 DIAGNOSIS — M5137 Other intervertebral disc degeneration, lumbosacral region: Secondary | ICD-10-CM | POA: Diagnosis not present

## 2019-10-27 DIAGNOSIS — M4722 Other spondylosis with radiculopathy, cervical region: Secondary | ICD-10-CM | POA: Diagnosis not present

## 2019-10-31 DIAGNOSIS — E559 Vitamin D deficiency, unspecified: Secondary | ICD-10-CM | POA: Diagnosis not present

## 2019-10-31 DIAGNOSIS — Z7189 Other specified counseling: Secondary | ICD-10-CM | POA: Diagnosis not present

## 2019-10-31 DIAGNOSIS — M810 Age-related osteoporosis without current pathological fracture: Secondary | ICD-10-CM | POA: Diagnosis not present

## 2019-11-04 ENCOUNTER — Other Ambulatory Visit: Payer: Self-pay | Admitting: Adult Health

## 2019-11-04 DIAGNOSIS — E782 Mixed hyperlipidemia: Secondary | ICD-10-CM | POA: Diagnosis not present

## 2019-11-04 DIAGNOSIS — K219 Gastro-esophageal reflux disease without esophagitis: Secondary | ICD-10-CM | POA: Diagnosis not present

## 2019-11-04 DIAGNOSIS — F4321 Adjustment disorder with depressed mood: Secondary | ICD-10-CM | POA: Diagnosis not present

## 2019-11-04 DIAGNOSIS — M791 Myalgia, unspecified site: Secondary | ICD-10-CM | POA: Diagnosis not present

## 2019-11-04 DIAGNOSIS — G40909 Epilepsy, unspecified, not intractable, without status epilepticus: Secondary | ICD-10-CM | POA: Diagnosis not present

## 2019-11-04 DIAGNOSIS — T466X5A Adverse effect of antihyperlipidemic and antiarteriosclerotic drugs, initial encounter: Secondary | ICD-10-CM | POA: Diagnosis not present

## 2019-11-04 DIAGNOSIS — Z79899 Other long term (current) drug therapy: Secondary | ICD-10-CM | POA: Diagnosis not present

## 2019-11-04 DIAGNOSIS — R739 Hyperglycemia, unspecified: Secondary | ICD-10-CM | POA: Diagnosis not present

## 2019-11-04 DIAGNOSIS — Z0000 Encounter for general adult medical examination without abnormal findings: Secondary | ICD-10-CM | POA: Diagnosis not present

## 2019-11-11 ENCOUNTER — Other Ambulatory Visit: Payer: Self-pay

## 2019-11-11 ENCOUNTER — Encounter: Payer: Self-pay | Admitting: Cardiology

## 2019-11-11 ENCOUNTER — Ambulatory Visit: Payer: Medicare HMO | Admitting: Cardiology

## 2019-11-11 VITALS — BP 112/78 | HR 99 | Ht 61.5 in | Wt 126.0 lb

## 2019-11-11 DIAGNOSIS — E785 Hyperlipidemia, unspecified: Secondary | ICD-10-CM | POA: Diagnosis not present

## 2019-11-11 DIAGNOSIS — E7800 Pure hypercholesterolemia, unspecified: Secondary | ICD-10-CM | POA: Diagnosis not present

## 2019-11-11 DIAGNOSIS — I10 Essential (primary) hypertension: Secondary | ICD-10-CM

## 2019-11-11 DIAGNOSIS — F17200 Nicotine dependence, unspecified, uncomplicated: Secondary | ICD-10-CM | POA: Diagnosis not present

## 2019-11-11 NOTE — Progress Notes (Signed)
Cardiology Office Note:    Date:  11/11/2019   ID:  Crystal Lyons, DOB 10-24-1965, MRN [id number]  PCP:  Street, Sharon Mt, MD  Cardiologist:  Jenne Campus, MD    Referring MD: Street, Sharon Mt, *   Chief Complaint  Patient presents with  . Follow-up    5 MO FU   I am worried about my heart  History of Present Illness:    Crystal Lyons is a 54 y.o. female past medical history significant for hypertension, dyslipidemia, unable to take many medications, smoking. She comes today to my office for follow-up. She did have a CT of her chest done she was find to have some calcification of the aorta and she is very worried about it. We spent a great deal of time talking about the situation I told her she must quit smoking. She tells me that she does not smoke much she smokes only 1-1/2 pack/day, I told her that this is a lot but she said it is much less than it used to be because it used to be 2 to 3 packs/day. And again I recommended strongly to quit it. I we also tried different medication to manage her cholesterol. She was not able to take Lipitor she was not able to the Crestor. She takes pravastatin anytime she recalled that she need to take it. I explained to her that atherosclerosis that we noted on her ordered there was a cholesterol deposits and cholesterol need to be well managed and I hopefully was able to convince her to start taking pravastatin with a regular basis. I also talked to her about the need to exercise on the regular basis. She said she cannot do it because she is under a lot of stress. Overall it was very difficult discussion. And this is the discussion were going to many times before. Complain of having chest pain pain however is not related to exercise it happened after she eats certain type of food. She is also complained of having some shortness of breath and my argumentation that this is probably because of years of smoking did not convince her that this is the  problem.  I did review her echocardiogram that was done in June 06, 2019 showing normal left ventricle ejection fraction.  Past Medical History:  Diagnosis Date  . Anxiety   . Atypical chest pain   . Barrett esophagus   . Carpal tunnel syndrome    right and left wrists  . Dysrhythmia   . GERD (gastroesophageal reflux disease)   . Headache(784.0) 03/09/2014  . Heart murmur   . Hiatal hernia   . Hyperlipidemia   . Hypertension   . Osteopenia   . Seizures ([place]) 09/27/2012  . Shingles   . Sleep apnea   . Sleep disorder 09/27/2012    Past Surgical History:  Procedure Laterality Date  . BRAIN SURGERY  01-06-85  . BRAIN SURGERY Right 1986   "right temporal lobe"  . BREAST BIOPSY    . CHOLECYSTECTOMY    . CYST REMOVAL TRUNK    . ears    . EYE SURGERY     cyst removed  . GALLBLADDER SURGERY N/A 2009  . NECK SURGERY    . SHOULDER SURGERY Bilateral 2009, 2010  . VULVECTOMY N/A 07/04/2018   Procedure: WIDE EXCISION VULVECTOMY;  Surgeon: Vanessa Kick, MD;  Location: [facility];  Service: Gynecology;  Laterality: N/A;    Current Medications: Current Meds  Medication Sig  . albuterol (  PROVENTIL HFA;VENTOLIN HFA) 108 (90 Base) MCG/ACT inhaler Inhale 2 puffs into the lungs every 6 (six) hours as needed for wheezing or shortness of breath.  . ALPRAZolam (XANAX) 0.5 MG tablet TAKE 1 TABLET BY MOUTH 2 TIMES DAILY AS NEEDED FOR ANXIETY  . denosumab (PROLIA) 60 MG/ML SOSY injection INJECT 60MG  (1ML) SUBCUTANEOUSLY EVERY SIX MONTHS  . famotidine (PEPCID) 40 MG tablet Take 40 mg by mouth every other day.  Marland Kitchen HYDROcodone-acetaminophen (NORCO/VICODIN) 5-325 MG per tablet Take 1 tablet by mouth 4 (four) times daily.   . hydrOXYzine (ATARAX/VISTARIL) 25 MG tablet Take 25 mg by mouth at bedtime as needed (sleep).   . lansoprazole (PREVACID) 30 MG capsule Take 30 mg by mouth daily.   Marland Kitchen NASONEX 50 MCG/ACT nasal spray Place 2 sprays into the nose as needed.   Marland Kitchen spironolactone-hydrochlorothiazide  (ALDACTAZIDE) 25-25 MG tablet Take 1 tablet by mouth daily.  Marland Kitchen tiotropium (SPIRIVA HANDIHALER) 18 MCG inhalation capsule Place 18 mcg into inhaler and inhale as needed.   Marland Kitchen tiZANidine (ZANAFLEX) 4 MG tablet Take 4 mg by mouth every 8 (eight) hours as needed for muscle spasms.   Marland Kitchen VIVELLE-DOT 0.05 MG/24HR Place 1 patch onto the skin 2 (two) times a week.      Allergies:   Adenosine, Azithromycin, Aleve [naproxen sodium], Feldene [piroxicam], Keflex [cephalexin], Mobic [meloxicam], Neurontin [gabapentin], Other, Penicillins, Tape, and Vioxx [rofecoxib]   Social History   Socioeconomic History  . Marital status: Single    Spouse name: Not on file  . Number of children: 0  . Years of education: 8  . Highest education level: Not on file  Occupational History  . Occupation: Disabled  Tobacco Use  . Smoking status: Current Every Day Smoker    Packs/day: 1.50    Types: Cigarettes  . Smokeless tobacco: Former Systems developer    Types: Chew  Substance and Sexual Activity  . Alcohol use: No  . Drug use: No  . Sexual activity: Not on file  Other Topics Concern  . Not on file  Social History Narrative   Patient lives at home mother    Patient has no children.    Patient is single.    Patient has a 8th grade education.    Patient is disabled         Social Determinants of Radio broadcast assistant Strain:   . Difficulty of Paying Living Expenses:   Food Insecurity:   . Worried About Charity fundraiser in the Last Year:   . Arboriculturist in the Last Year:   Transportation Needs:   . Film/video editor (Medical):   Marland Kitchen Lack of Transportation (Non-Medical):   Physical Activity:   . Days of Exercise per Week:   . Minutes of Exercise per Session:   Stress:   . Feeling of Stress :   Social Connections:   . Frequency of Communication with Friends and Family:   . Frequency of Social Gatherings with Friends and Family:   . Attends Religious Services:   . Active Member of Clubs or  Organizations:   . Attends Archivist Meetings:   Marland Kitchen Marital Status:      Family History: The patient's family history includes Cancer in her father and mother; Cirrhosis in her sister; Diabetes in her father; Heart failure in her father; Hypertension in her father and mother; Rheum arthritis in her mother; Stroke in her mother; Thyroid disease in her mother. ROS:   Please see  the history of present illness.    All 14 point review of systems negative except as described per history of present illness  EKGs/Labs/Other Studies Reviewed:      Recent Labs: 01/20/2019: BUN 19; Creatinine, Ser 0.99; Potassium 4.2; Sodium 140  Recent Lipid Panel    Component Value Date/Time   CHOL 198 01/20/2019 1126   TRIG 362 (H) 01/20/2019 1126   HDL 31 (L) 01/20/2019 1126   CHOLHDL 6.4 (H) 01/20/2019 1126   LDLCALC 95 01/20/2019 1126    Physical Exam:    VS:  BP 112/78   Pulse 99   Ht 5' 1.5" (1.562 m)   Wt 126 lb (57.2 kg)   SpO2 98%   BMI 23.42 kg/m     Wt Readings from Last 3 Encounters:  11/11/19 126 lb (57.2 kg)  09/01/19 132 lb 12.8 oz (60.2 kg)  06/04/19 131 lb 9.6 oz (59.7 kg)     GEN:  Well nourished, well developed in no acute distress HEENT: Normal NECK: No JVD; No carotid bruits LYMPHATICS: No lymphadenopathy CARDIAC: RRR, no murmurs, no rubs, no gallops RESPIRATORY:  Clear to auscultation without rales, wheezing or rhonchi  ABDOMEN: Soft, non-tender, non-distended MUSCULOSKELETAL:  No edema; No deformity  SKIN: Warm and dry LOWER EXTREMITIES: no swelling NEUROLOGIC:  Alert and oriented x 3 PSYCHIATRIC:  Normal affect   ASSESSMENT:    1. Dyslipidemia   2. Essential hypertension   3. Smoking   4. Hypercholesterolemia    PLAN:    In order of problems listed above:  1. Dyslipidemia plan as outlined above, hopefully I will be able to convince her to take at least small dose of low intensity statin.  She is willing to try pravastatin every day.  I will put  order in the computer to have her fasting lipid profile done and hopefully she will have it checked within the next 6 weeks. 2. Essential hypertension: Blood pressure seems to be well controlled continue present management. 3. Smoking: Discussion as above. 4. Dyspnea on exertion multifactorial echocardiogram showed normal left ventricle ejection fraction.  I told her this is most likely related to smoking and injury to her lungs that she probably has because of years of smoking. 5. I see her back in my office in about 3 months to see how she does. 6. I did review her K PN and data from 11/04/2019 showing her LDL of 141 HDL 35, this is unacceptably high cholesterol profile as well we talked about measures that she can do to control it, her hemoglobin A1c was 5.4 which is good.  Her creatinine was 1.2.   Medication Adjustments/Labs and Tests Ordered: Current medicines are reviewed at length with the patient today.  Concerns regarding medicines are outlined above.  Orders Placed This Encounter  Procedures  . Lipid panel  . EKG 12-Lead   Medication changes: No orders of the defined types were placed in this encounter.   Signed, Park Liter, MD, [facility] 11/11/2019 1:37 PM    [employer]

## 2019-11-11 NOTE — Progress Notes (Signed)
lipi

## 2019-11-11 NOTE — Patient Instructions (Signed)
Medication Instructions:  Your physician recommends that you continue on your current medications as directed. Please refer to the Current Medication list given to you today.  *If you need a refill on your cardiac medications before your next appointment, please call your pharmacy*   Lab Work: Your physician recommends that you return for lab work in 6 weeks fasting: lipids   If you have labs (blood work) drawn today and your tests are completely normal, you will receive your results only by: Marland Kitchen MyChart Message (if you have MyChart) OR . A paper copy in the mail If you have any lab test that is abnormal or we need to change your treatment, we will call you to review the results.   Testing/Procedures: None.    Follow-Up: At [facility], you and your health needs are our priority.  As part of our continuing mission to provide you with exceptional heart care, we have created designated Provider Care Teams.  These Care Teams include your primary Cardiologist (physician) and Advanced Practice Providers (APPs -  Physician Assistants and Nurse Practitioners) who all work together to provide you with the care you need, when you need it.  We recommend signing up for the patient portal called "MyChart".  Sign up information is provided on this After Visit Summary.  MyChart is used to connect with patients for Virtual Visits (Telemedicine).  Patients are able to view lab/test results, encounter notes, upcoming appointments, etc.  Non-urgent messages can be sent to your provider as well.   To learn more about what you can do with MyChart, go to [email or web address].    Your next appointment:   3 month(s)  The format for your next appointment:   In Person  Provider:   Jenne Campus, MD   Other Instructions

## 2019-11-14 DIAGNOSIS — M810 Age-related osteoporosis without current pathological fracture: Secondary | ICD-10-CM | POA: Diagnosis not present

## 2019-11-14 DIAGNOSIS — M85852 Other specified disorders of bone density and structure, left thigh: Secondary | ICD-10-CM | POA: Diagnosis not present

## 2019-11-27 DIAGNOSIS — L03116 Cellulitis of left lower limb: Secondary | ICD-10-CM | POA: Diagnosis not present

## 2019-11-27 DIAGNOSIS — H6692 Otitis media, unspecified, left ear: Secondary | ICD-10-CM | POA: Diagnosis not present

## 2019-11-27 DIAGNOSIS — H6092 Unspecified otitis externa, left ear: Secondary | ICD-10-CM | POA: Diagnosis not present

## 2019-11-27 DIAGNOSIS — Z6823 Body mass index (BMI) 23.0-23.9, adult: Secondary | ICD-10-CM | POA: Diagnosis not present

## 2019-12-11 DIAGNOSIS — H9192 Unspecified hearing loss, left ear: Secondary | ICD-10-CM | POA: Diagnosis not present

## 2019-12-11 DIAGNOSIS — H6092 Unspecified otitis externa, left ear: Secondary | ICD-10-CM | POA: Diagnosis not present

## 2019-12-11 DIAGNOSIS — Z6823 Body mass index (BMI) 23.0-23.9, adult: Secondary | ICD-10-CM | POA: Diagnosis not present

## 2019-12-11 DIAGNOSIS — H6692 Otitis media, unspecified, left ear: Secondary | ICD-10-CM | POA: Diagnosis not present

## 2019-12-24 DIAGNOSIS — R1033 Periumbilical pain: Secondary | ICD-10-CM | POA: Diagnosis not present

## 2019-12-24 DIAGNOSIS — K591 Functional diarrhea: Secondary | ICD-10-CM | POA: Diagnosis not present

## 2019-12-25 DIAGNOSIS — E785 Hyperlipidemia, unspecified: Secondary | ICD-10-CM | POA: Diagnosis not present

## 2019-12-26 LAB — LIPID PANEL
Chol/HDL Ratio: 6.6 ratio — ABNORMAL HIGH (ref 0.0–4.4)
Cholesterol, Total: 250 mg/dL — ABNORMAL HIGH (ref 100–199)
HDL: 38 mg/dL — ABNORMAL LOW (ref >39)
LDL Chol Calc (NIH): 163 mg/dL — ABNORMAL HIGH (ref 0–99)
Triglycerides: 260 mg/dL — ABNORMAL HIGH (ref 0–149)
VLDL Cholesterol Cal: 49 mg/dL — ABNORMAL HIGH (ref 5–40)

## 2019-12-29 ENCOUNTER — Other Ambulatory Visit: Payer: Self-pay | Admitting: Adult Health

## 2020-01-09 ENCOUNTER — Telehealth: Payer: Self-pay | Admitting: Emergency Medicine

## 2020-01-09 DIAGNOSIS — E7800 Pure hypercholesterolemia, unspecified: Secondary | ICD-10-CM

## 2020-01-09 NOTE — Telephone Encounter (Signed)
Called patient. Informed him of her results of lipid panel. Advised her that Dr. Agustin Cree would like her to see lipid clinic. Referral has been placed. No further questions.

## 2020-01-20 IMAGING — MG DIGITAL DIAGNOSTIC BILATERAL MAMMOGRAM WITH TOMO AND CAD
6 of 10 series · 6 of 30 positions shown · non-contrast
Comparison: Previous exam(s).

CLINICAL DATA: 52-year-old female with pain/burning of the left
nipple for approximately 1 year.

EXAM:
DIGITAL DIAGNOSTIC BILATERAL MAMMOGRAM WITH CAD AND TOMO
ULTRASOUND LEFT BREAST

[L CC synth-2D]
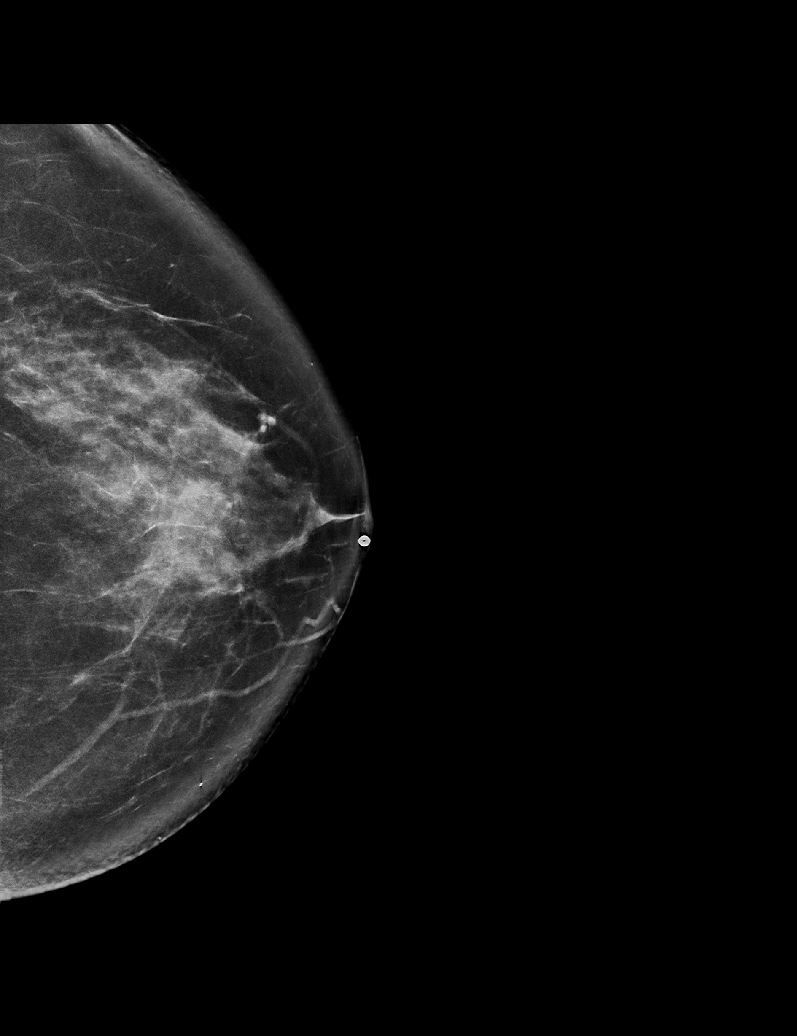

[L TAN synth-2D]
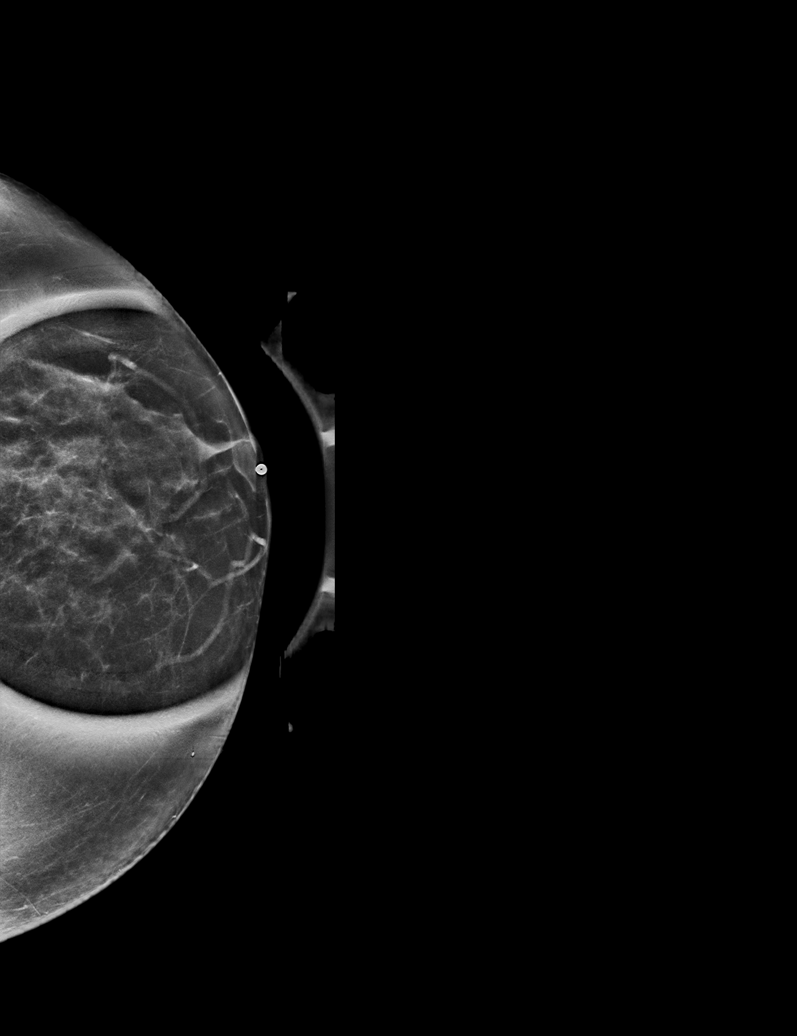

[L MLO synth-2D]
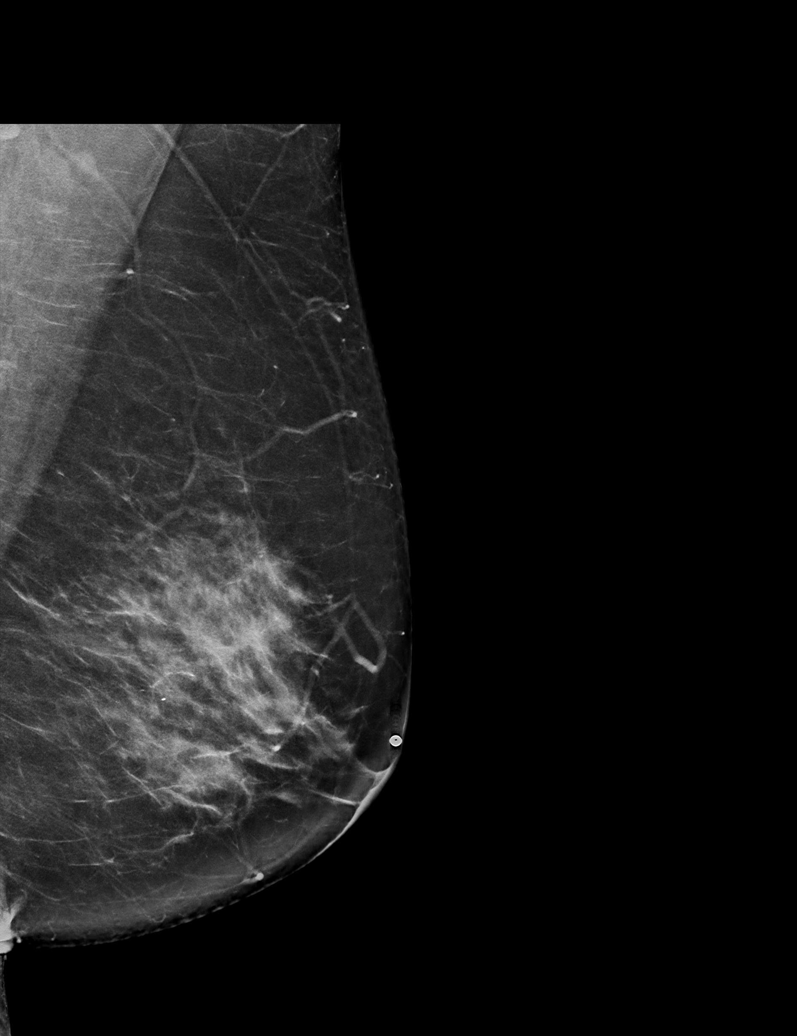

[R MLO synth-2D]
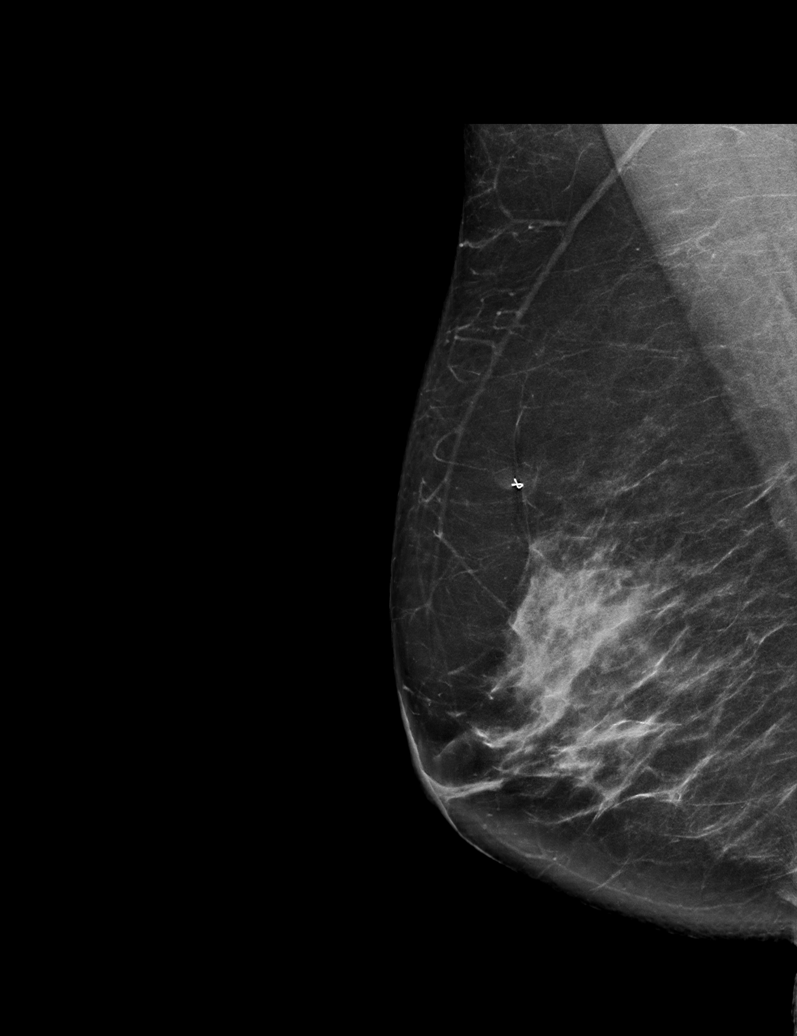

[R CC synth-2D]
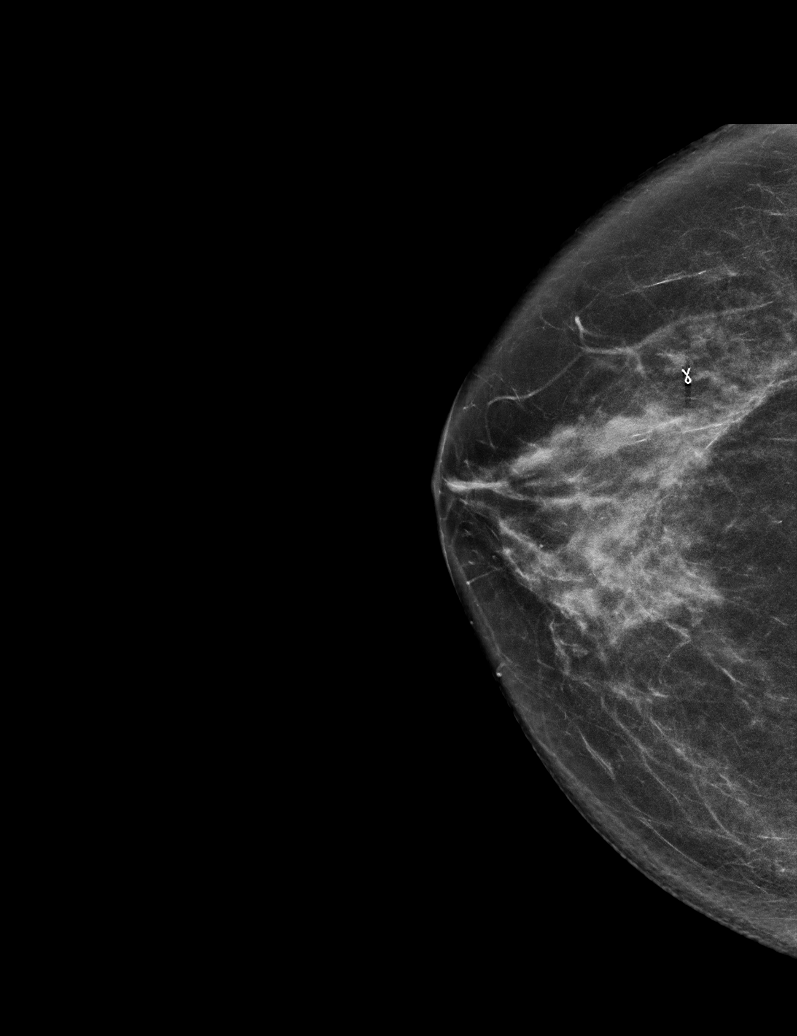

[L TAN tomo · tomo slice 31/61.0]
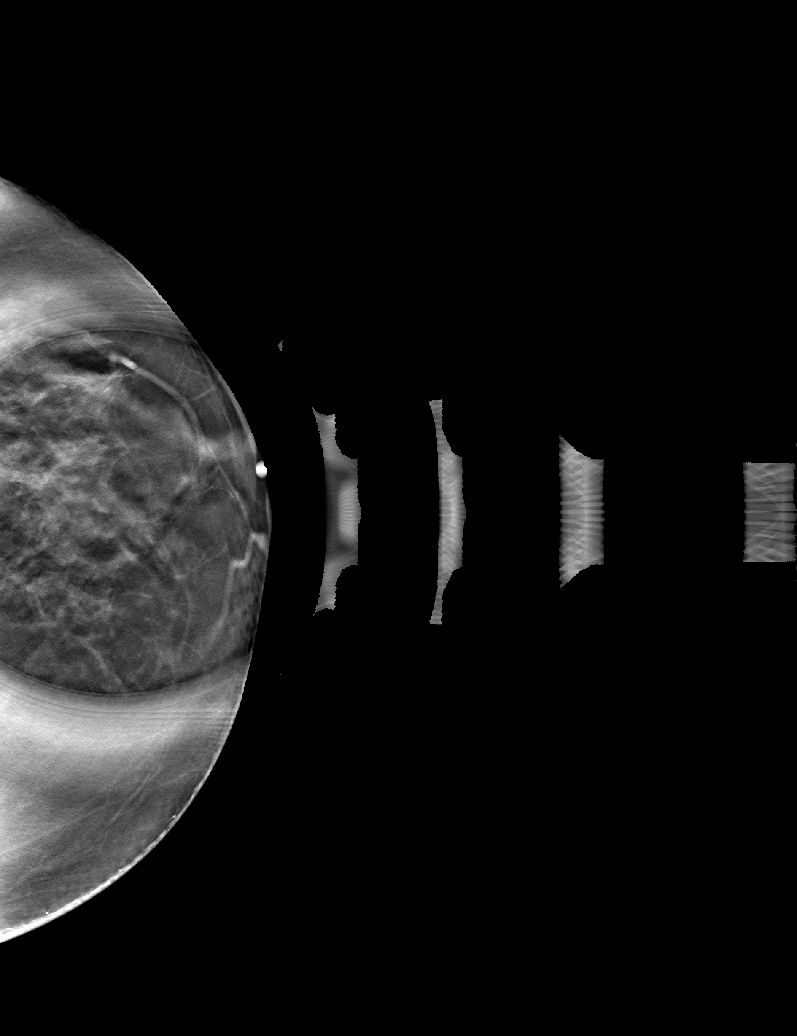

[6 of 30 positions shown; findings below may reference images not displayed]

ACR Breast Density Category c: The breast tissue is heterogeneously
dense, which may obscure small masses.
FINDINGS: No suspicious mammographic findings are identified in either breast.
The parenchymal pattern is stable.

Mammographic images were processed with CAD.

Targeted ultrasound is performed, showing normal fibroglandular
tissue without focal or suspicious sonographic abnormality.
Evaluation of the subareolar left breast was performed.
IMPRESSION: 1. No mammographic evidence of malignancy in either breast.
2. No focal mammographic or sonographic findings at the site of the
patient's left breast nipple pain/burning.

RECOMMENDATION:
1. Clinical follow-up recommended for the symptomatic area of
concern in the left breast. Any further workup should be based on
clinical grounds.
2.  Screening mammogram in one year.(Code:[id number])

I have discussed the findings and recommendations with the patient.
Results were also provided in writing at the conclusion of the
visit. If applicable, a reminder letter will be sent to the patient
regarding the next appointment.

BI-RADS CATEGORY  1: Negative.

## 2020-01-26 DIAGNOSIS — M5137 Other intervertebral disc degeneration, lumbosacral region: Secondary | ICD-10-CM | POA: Diagnosis not present

## 2020-01-26 DIAGNOSIS — M4722 Other spondylosis with radiculopathy, cervical region: Secondary | ICD-10-CM | POA: Diagnosis not present

## 2020-01-26 DIAGNOSIS — M961 Postlaminectomy syndrome, not elsewhere classified: Secondary | ICD-10-CM | POA: Diagnosis not present

## 2020-02-03 DIAGNOSIS — M7552 Bursitis of left shoulder: Secondary | ICD-10-CM | POA: Diagnosis not present

## 2020-02-03 DIAGNOSIS — I10 Essential (primary) hypertension: Secondary | ICD-10-CM | POA: Diagnosis not present

## 2020-02-03 DIAGNOSIS — Z6823 Body mass index (BMI) 23.0-23.9, adult: Secondary | ICD-10-CM | POA: Diagnosis not present

## 2020-02-03 DIAGNOSIS — M75102 Unspecified rotator cuff tear or rupture of left shoulder, not specified as traumatic: Secondary | ICD-10-CM | POA: Diagnosis not present

## 2020-02-03 DIAGNOSIS — M12812 Other specific arthropathies, not elsewhere classified, left shoulder: Secondary | ICD-10-CM | POA: Diagnosis not present

## 2020-02-09 DIAGNOSIS — H903 Sensorineural hearing loss, bilateral: Secondary | ICD-10-CM | POA: Diagnosis not present

## 2020-02-09 DIAGNOSIS — H905 Unspecified sensorineural hearing loss: Secondary | ICD-10-CM | POA: Diagnosis not present

## 2020-02-09 DIAGNOSIS — Z77122 Contact with and (suspected) exposure to noise: Secondary | ICD-10-CM | POA: Diagnosis not present

## 2020-02-09 DIAGNOSIS — H9202 Otalgia, left ear: Secondary | ICD-10-CM | POA: Diagnosis not present

## 2020-02-09 DIAGNOSIS — H9319 Tinnitus, unspecified ear: Secondary | ICD-10-CM | POA: Diagnosis not present

## 2020-02-11 DIAGNOSIS — I10 Essential (primary) hypertension: Secondary | ICD-10-CM | POA: Insufficient documentation

## 2020-02-11 DIAGNOSIS — M8580 Other specified disorders of bone density and structure, unspecified site: Secondary | ICD-10-CM | POA: Insufficient documentation

## 2020-02-11 DIAGNOSIS — G4730 Sleep apnea, unspecified: Secondary | ICD-10-CM | POA: Insufficient documentation

## 2020-02-11 DIAGNOSIS — K2270 Barrett's esophagus without dysplasia: Secondary | ICD-10-CM | POA: Insufficient documentation

## 2020-02-11 DIAGNOSIS — E785 Hyperlipidemia, unspecified: Secondary | ICD-10-CM | POA: Insufficient documentation

## 2020-02-11 DIAGNOSIS — F419 Anxiety disorder, unspecified: Secondary | ICD-10-CM | POA: Insufficient documentation

## 2020-02-11 DIAGNOSIS — I499 Cardiac arrhythmia, unspecified: Secondary | ICD-10-CM | POA: Insufficient documentation

## 2020-02-11 DIAGNOSIS — R011 Cardiac murmur, unspecified: Secondary | ICD-10-CM | POA: Insufficient documentation

## 2020-02-11 DIAGNOSIS — G5600 Carpal tunnel syndrome, unspecified upper limb: Secondary | ICD-10-CM | POA: Insufficient documentation

## 2020-02-12 ENCOUNTER — Encounter: Payer: Self-pay | Admitting: Cardiology

## 2020-02-12 ENCOUNTER — Ambulatory Visit: Payer: Medicare HMO | Admitting: Cardiology

## 2020-02-12 ENCOUNTER — Other Ambulatory Visit: Payer: Self-pay

## 2020-02-12 VITALS — Ht 61.5 in | Wt 121.0 lb

## 2020-02-12 DIAGNOSIS — K22719 Barrett's esophagus with dysplasia, unspecified: Secondary | ICD-10-CM | POA: Diagnosis not present

## 2020-02-12 DIAGNOSIS — E785 Hyperlipidemia, unspecified: Secondary | ICD-10-CM | POA: Diagnosis not present

## 2020-02-12 DIAGNOSIS — R0789 Other chest pain: Secondary | ICD-10-CM

## 2020-02-12 DIAGNOSIS — I10 Essential (primary) hypertension: Secondary | ICD-10-CM | POA: Diagnosis not present

## 2020-02-12 DIAGNOSIS — F419 Anxiety disorder, unspecified: Secondary | ICD-10-CM

## 2020-02-12 NOTE — Patient Instructions (Signed)
Medication Instructions:  Your physician recommends that you continue on your current medications as directed. Please refer to the Current Medication list given to you today.  *If you need a refill on your cardiac medications before your next appointment, please call your pharmacy*   Lab Work: None ordered   If you have labs (blood work) drawn today and your tests are completely normal, you will receive your results only by: . MyChart Message (if you have MyChart) OR . A paper copy in the mail If you have any lab test that is abnormal or we need to change your treatment, we will call you to review the results.   Testing/Procedures: None ordered    Follow-Up: At [facility], you and your health needs are our priority.  As part of our continuing mission to provide you with exceptional heart care, we have created designated Provider Care Teams.  These Care Teams include your primary Cardiologist (physician) and Advanced Practice Providers (APPs -  Physician Assistants and Nurse Practitioners) who all work together to provide you with the care you need, when you need it.  We recommend signing up for the patient portal called "MyChart".  Sign up information is provided on this After Visit Summary.  MyChart is used to connect with patients for Virtual Visits (Telemedicine).  Patients are able to view lab/test results, encounter notes, upcoming appointments, etc.  Non-urgent messages can be sent to your provider as well.   To learn more about what you can do with MyChart, go to [email or web address].    Your next appointment:   5 month(s)  The format for your next appointment:   In Person  Provider:   Robert Krasowski, MD   Other Instructions None   

## 2020-02-12 NOTE — Progress Notes (Signed)
Cardiology Office Note:    Date:  02/12/2020   ID:  HALO SHEVLIN, DOB Mar 23, 1966, MRN [id number]  PCP:  Street, [place], MD  Cardiologist:  Jenne Campus, MD    Referring MD: [address] [place] Mt, *   No chief complaint on file. Having abdominal pain  History of Present Illness:    Crystal Lyons is a 54 y.o. female with past medical history significant for essential hypertension, smoking which is heavy, dyslipidemia.  Comes today 2 months of follow-up.  Overall she says she is feeling fine except having lack of energy.  Sadly he still continues to smoke quite heavily she is to smoke 2 to 3 packs/day and then she reduced to about half to 1, and now she said she smokes between 1 to 1-1/2 pack/day.  She also described to have some epigastric pain that typically happen when she eats.  She does have history of Barrett's esophagus and she is being followed by our gastroenterologist here in town however she is somewhat dissatisfied with the care she is getting she is thinking about getting second opinion.  She denies having any exertional chest pain tightness squeezing pressure burning chest.  However, she complained of fatigue tiredness and shortness of breath.  Past Medical History:  Diagnosis Date  . Anxiety   . Atypical chest pain   . Barrett esophagus   . Carpal tunnel syndrome    right and left wrists  . Dysrhythmia   . GERD (gastroesophageal reflux disease)   . Headache(784.0) 03/09/2014  . Heart murmur   . Hiatal hernia   . Hyperlipidemia   . Hypertension   . Osteopenia   . Seizures ([place]) 09/27/2012  . Shingles   . Sleep apnea   . Sleep disorder 09/27/2012    Past Surgical History:  Procedure Laterality Date  . BRAIN SURGERY  01-06-85  . BRAIN SURGERY Right 1986   "right temporal lobe"  . BREAST BIOPSY    . CHOLECYSTECTOMY    . CYST REMOVAL TRUNK    . ears    . EYE SURGERY     cyst removed  . GALLBLADDER SURGERY N/A 2009  . NECK SURGERY    . SHOULDER  SURGERY Bilateral 2009, 2010  . VULVECTOMY N/A 07/04/2018   Procedure: WIDE EXCISION VULVECTOMY;  Surgeon: Vanessa Kick, MD;  Location: [facility];  Service: Gynecology;  Laterality: N/A;    Current Medications: Current Meds  Medication Sig  . albuterol (PROVENTIL HFA;VENTOLIN HFA) 108 (90 Base) MCG/ACT inhaler Inhale 2 puffs into the lungs every 6 (six) hours as needed for wheezing or shortness of breath.  . ALPRAZolam (XANAX) 0.5 MG tablet TAKE 1 TABLET BY MOUTH 2 TIMES DAILY AS NEEDED FOR ANXIETY  . denosumab (PROLIA) 60 MG/ML SOSY injection INJECT 60MG  (1ML) SUBCUTANEOUSLY EVERY SIX MONTHS  . famotidine (PEPCID) 40 MG tablet Take 40 mg by mouth every other day.  Marland Kitchen HYDROcodone-acetaminophen (NORCO/VICODIN) 5-325 MG per tablet Take 1 tablet by mouth 4 (four) times daily.   . hydrOXYzine (ATARAX/VISTARIL) 25 MG tablet Take 25 mg by mouth at bedtime as needed (sleep).   . lansoprazole (PREVACID) 30 MG capsule Take 30 mg by mouth daily.   Marland Kitchen NASONEX 50 MCG/ACT nasal spray Place 2 sprays into the nose as needed.   . promethazine (PHENERGAN) 12.5 MG tablet Take 12.5 mg by mouth every 8 (eight) hours as needed for nausea or vomiting.   Marland Kitchen spironolactone-hydrochlorothiazide (ALDACTAZIDE) 25-25 MG tablet Take 1 tablet by mouth daily.  Marland Kitchen  tiotropium (SPIRIVA HANDIHALER) 18 MCG inhalation capsule Place 18 mcg into inhaler and inhale as needed.   Marland Kitchen tiZANidine (ZANAFLEX) 4 MG tablet Take 4 mg by mouth every 8 (eight) hours as needed for muscle spasms.   Marland Kitchen VIVELLE-DOT 0.05 MG/24HR Place 1 patch onto the skin 2 (two) times a week.      Allergies:   Adenosine, Azithromycin, Aleve [naproxen sodium], Feldene [piroxicam], Keflex [cephalexin], Mobic [meloxicam], Neurontin [gabapentin], Other, Penicillins, Tape, and Vioxx [rofecoxib]   Social History   Socioeconomic History  . Marital status: Single    Spouse name: Not on file  . Number of children: 0  . Years of education: 8  . Highest education level: Not on  file  Occupational History  . Occupation: Disabled  Tobacco Use  . Smoking status: Current Every Day Smoker    Packs/day: 1.50    Types: Cigarettes  . Smokeless tobacco: Former Systems developer    Types: Secondary school teacher  . Vaping Use: Former  Substance and Sexual Activity  . Alcohol use: No  . Drug use: No  . Sexual activity: Not on file  Other Topics Concern  . Not on file  Social History Narrative   Patient lives at home mother    Patient has no children.    Patient is single.    Patient has a 8th grade education.    Patient is disabled         Social Determinants of Radio broadcast assistant Strain:   . Difficulty of Paying Living Expenses: Not on file  Food Insecurity:   . Worried About Charity fundraiser in the Last Year: Not on file  . Ran Out of Food in the Last Year: Not on file  Transportation Needs:   . Lack of Transportation (Medical): Not on file  . Lack of Transportation (Non-Medical): Not on file  Physical Activity:   . Days of Exercise per Week: Not on file  . Minutes of Exercise per Session: Not on file  Stress:   . Feeling of Stress : Not on file  Social Connections:   . Frequency of Communication with Friends and Family: Not on file  . Frequency of Social Gatherings with Friends and Family: Not on file  . Attends Religious Services: Not on file  . Active Member of Clubs or Organizations: Not on file  . Attends Archivist Meetings: Not on file  . Marital Status: Not on file     Family History: The patient's family history includes Cancer in her father and mother; Cirrhosis in her sister; Diabetes in her father; Heart failure in her father; Hypertension in her father and mother; Rheum arthritis in her mother; Stroke in her mother; Thyroid disease in her mother. ROS:   Please see the history of present illness.    All 14 point review of systems negative except as described per history of present illness  EKGs/Labs/Other Studies Reviewed:       Recent Labs: No results found for requested labs within last 8760 hours.  Recent Lipid Panel    Component Value Date/Time   CHOL 250 (H) 12/25/2019 1404   TRIG 260 (H) 12/25/2019 1404   HDL 38 (L) 12/25/2019 1404   CHOLHDL 6.6 (H) 12/25/2019 1404   LDLCALC 163 (H) 12/25/2019 1404    Physical Exam:    VS:  Ht 5' 1.5" (1.562 m)   Wt 121 lb (54.9 kg)   BMI 22.49 kg/m  Wt Readings from Last 3 Encounters:  02/12/20 121 lb (54.9 kg)  11/11/19 126 lb (57.2 kg)  09/01/19 132 lb 12.8 oz (60.2 kg)     GEN:  Well nourished, well developed in no acute distress HEENT: Normal NECK: No JVD; No carotid bruits LYMPHATICS: No lymphadenopathy CARDIAC: RRR, no murmurs, no rubs, no gallops RESPIRATORY:  Clear to auscultation without rales, wheezing or rhonchi  ABDOMEN: Soft, non-tender, non-distended MUSCULOSKELETAL:  No edema; No deformity  SKIN: Warm and dry LOWER EXTREMITIES: no swelling NEUROLOGIC:  Alert and oriented x 3 PSYCHIATRIC:  Normal affect   ASSESSMENT:    1. Essential hypertension   2. Barrett's esophagus with dysplasia   3. Dyslipidemia   4. Atypical chest pain   5. Anxiety    PLAN:    In order of problems listed above:  1. Essential hypertension: Blood pressure seems to be well controlled however she complained of the fact when she takes Aldactone with hydrochlorothiazide she feels weak and tired she try to cut it in half and she feels much better.  I told her it is fine to cut it in half and check her blood pressure on the regular basis. 2. Barrett's esophagus followed by GI however she is looking for second opinion which I think is a good idea disperses especially since she is symptomatic she was told there is [place] no issue. 3. Dyslipidemia: She was referred to lipid clinic we are waiting for appointment for her to talk about appropriate medication for cholesterol.  I did review her K PN I do have data from 11/04/2019 showing LDL of 141 and HDL 38.  She  got intolerance to multiple statins. 4. Anxiety.  Not at present.   Medication Adjustments/Labs and Tests Ordered: Current medicines are reviewed at length with the patient today.  Concerns regarding medicines are outlined above.  No orders of the defined types were placed in this encounter.  Medication changes: No orders of the defined types were placed in this encounter.   Signed, Park Liter, MD, [facility] 02/12/2020 1:28 PM    [facility]

## 2020-03-10 ENCOUNTER — Encounter: Payer: Self-pay | Admitting: Adult Health

## 2020-03-10 ENCOUNTER — Ambulatory Visit: Payer: Medicare HMO | Admitting: Adult Health

## 2020-03-10 VITALS — BP 111/74 | HR 79 | Ht 61.5 in | Wt 119.0 lb

## 2020-03-10 DIAGNOSIS — R569 Unspecified convulsions: Secondary | ICD-10-CM | POA: Diagnosis not present

## 2020-03-10 NOTE — Progress Notes (Signed)
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  She returns today for follow-up.  She denies any seizure events.  Continues to get nervousness in the pit of her stomach that she describes as an aura.  She takes Xanax twice a day.  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  She returns today for follow-up.  She she denies any seizure events.  She states that she will still get a nervousness in the pit of her stomach that she describes as an aura.  She takes Xanax approximately twice a day.  The patient has swelling around the left eye.  She states that she was cleaning out a filter under the house and developed swelling.  Notes that swelling started last week no trauma to the eye.  She has an appointment Thursday to see her PCP.  Denies any changes with her vision.  I did advise the patient that she should see her PCP sooner if she develops new symptoms.  She reports "why? I do not have anything to live for."  States that her father passed away in 03-03-2023.  States that she does not have a great relationship with her mother and brother.  She does acknowledge that she is still going through the grieving process and is experiencing some depression.  She reports at times she has had thoughts of suicide but no plan to carry this out.  REVIEW OF SYSTEMS: Out of a complete 14 system review of symptoms, the patient complains only of the following symptoms, and all other reviewed systems are negative.  ALLERGIES: Allergies  Allergen Reactions  . Adenosine Anaphylaxis    Heart block  . Azithromycin Swelling  . Aleve [Naproxen  Sodium]   . Feldene [Piroxicam]   . Keflex [Cephalexin]   . Mobic [Meloxicam]   . Neurontin [Gabapentin]   . Other     Any anti-inflammatory  . Penicillins Other (See Comments)    Welts DID THE REACTION INVOLVE: Swelling of the face/tongue/throat, SOB, or low BP? Unknown Sudden or severe rash/hives, skin peeling, or the inside of the mouth or nose? Unknown Did it require medical treatment? Unknown When did it last happen?Childhood allergy If all above answers are "NO", may proceed with cephalosporin use.   . Tape     Medical adhesive tape  . Vioxx [Rofecoxib]     HOME MEDICATIONS: Outpatient Medications Prior to Visit  Medication Sig Dispense Refill  . albuterol (PROVENTIL HFA;VENTOLIN HFA) 108 (90 Base) MCG/ACT inhaler Inhale 2 puffs into the lungs every 6 (six) hours as needed for wheezing or shortness of breath.    . ALPRAZolam (XANAX) 0.5 MG tablet TAKE 1 TABLET BY MOUTH 2 TIMES DAILY AS NEEDED FOR ANXIETY 60 tablet 5  . denosumab (PROLIA) 60 MG/ML SOSY injection INJECT 60MG  (1ML) SUBCUTANEOUSLY EVERY SIX MONTHS    . famotidine (PEPCID) 40 MG tablet Take 40 mg by mouth every other day.    Marland Kitchen HYDROcodone-acetaminophen (NORCO/VICODIN) 5-325 MG per tablet Take 1 tablet by mouth 4 (four) times daily.     . hydrOXYzine (ATARAX/VISTARIL) 25 MG tablet Take 25 mg by mouth at bedtime as needed (  sleep).     . lansoprazole (PREVACID) 30 MG capsule Take 30 mg by mouth daily.     Marland Kitchen NASONEX 50 MCG/ACT nasal spray Place 2 sprays into the nose as needed.     . promethazine (PHENERGAN) 12.5 MG tablet Take 12.5 mg by mouth every 8 (eight) hours as needed for nausea or vomiting.     Marland Kitchen spironolactone-hydrochlorothiazide (ALDACTAZIDE) 25-25 MG tablet Take 1 tablet by mouth daily.    Marland Kitchen tiotropium (SPIRIVA HANDIHALER) 18 MCG inhalation capsule Place 18 mcg into inhaler and inhale as needed.     Marland Kitchen tiZANidine (ZANAFLEX) 4 MG tablet Take 4 mg by mouth every 8 (eight) hours as needed for muscle  spasms.     Marland Kitchen VIVELLE-DOT 0.05 MG/24HR Place 1 patch onto the skin 2 (two) times a week.     . rosuvastatin (CRESTOR) 20 MG tablet Take 1 tablet (20 mg total) by mouth daily. (Patient not taking: Reported on 09/01/2019) 30 tablet 2   No facility-administered medications prior to visit.    PAST MEDICAL HISTORY: Past Medical History:  Diagnosis Date  . Anxiety   . Atypical chest pain   . Barrett esophagus   . Carpal tunnel syndrome    right and left wrists  . Dysrhythmia   . GERD (gastroesophageal reflux disease)   . Headache(784.0) 03/09/2014  . Heart murmur   . Hiatal hernia   . Hyperlipidemia   . Hypertension   . Osteopenia   . Seizures ([place]) 09/27/2012  . Shingles   . Sleep apnea   . Sleep disorder 09/27/2012    PAST SURGICAL HISTORY: Past Surgical History:  Procedure Laterality Date  . BRAIN SURGERY  01-06-85  . BRAIN SURGERY Right 1986   "right temporal lobe"  . BREAST BIOPSY    . CHOLECYSTECTOMY    . CYST REMOVAL TRUNK    . ears    . EYE SURGERY     cyst removed  . GALLBLADDER SURGERY N/A 2009  . NECK SURGERY    . SHOULDER SURGERY Bilateral 2009, 2010  . VULVECTOMY N/A 07/04/2018   Procedure: WIDE EXCISION VULVECTOMY;  Surgeon: Vanessa Kick, MD;  Location: [facility];  Service: Gynecology;  Laterality: N/A;    FAMILY HISTORY: Family History  Problem Relation Age of Onset  . Hypertension Father   . Cancer Father   . Diabetes Father   . Heart failure Father   . Cancer Mother   . Thyroid disease Mother   . Rheum arthritis Mother   . Stroke Mother   . Hypertension Mother   . Cirrhosis Sister     SOCIAL HISTORY: Social History   Socioeconomic History  . Marital status: Single    Spouse name: Not on file  . Number of children: 0  . Years of education: 8  . Highest education level: Not on file  Occupational History  . Occupation: Disabled  Tobacco Use  . Smoking status: Current Every Day Smoker    Packs/day: 1.50    Types: Cigarettes  . Smokeless  tobacco: Former Systems developer    Types: Secondary school teacher  . Vaping Use: Former  Substance and Sexual Activity  . Alcohol use: No  . Drug use: No  . Sexual activity: Not on file  Other Topics Concern  . Not on file  Social History Narrative   Patient lives at home mother    Patient has no children.    Patient is single.    Patient has a 8th grade  education.    Patient is disabled         Social Determinants of Radio broadcast assistant Strain:   . Difficulty of Paying Living Expenses: Not on file  Food Insecurity:   . Worried About Charity fundraiser in the Last Year: Not on file  . Ran Out of Food in the Last Year: Not on file  Transportation Needs:   . Lack of Transportation (Medical): Not on file  . Lack of Transportation (Non-Medical): Not on file  Physical Activity:   . Days of Exercise per Week: Not on file  . Minutes of Exercise per Session: Not on file  Stress:   . Feeling of Stress : Not on file  Social Connections:   . Frequency of Communication with Friends and Family: Not on file  . Frequency of Social Gatherings with Friends and Family: Not on file  . Attends Religious Services: Not on file  . Active Member of Clubs or Organizations: Not on file  . Attends Archivist Meetings: Not on file  . Marital Status: Not on file  Intimate Partner Violence:   . Fear of Current or Ex-Partner: Not on file  . Emotionally Abused: Not on file  . Physically Abused: Not on file  . Sexually Abused: Not on file      PHYSICAL EXAM  Vitals:   03/10/20 1332  Weight: 119 lb (54 kg)  Height: 5' 1.5" (1.562 m)   Body mass index is 22.12 kg/m.  Generalized: Well developed, in no acute distress   Neurological examination  Mentation: Alert oriented to time, place, history taking. Follows all commands speech and language fluent Cranial nerve II-XII: Pupils were equal round reactive to light. Extraocular movements were full, visual field were full on confrontational  test. Facial sensation and strength were normal. Uvula tongue midline. Head turning and shoulder shrug  were normal and symmetric. Motor: The motor testing reveals 5 over 5 strength of all 4 extremities. Good symmetric motor tone is noted throughout.  Sensory: Sensory testing is intact to soft touch on all 4 extremities. No evidence of extinction is noted.  Coordination: Cerebellar testing reveals good finger-nose-finger and heel-to-shin bilaterally.  Gait and station: Gait is normal.    DIAGNOSTIC DATA (LABS, IMAGING, TESTING) - I reviewed patient records, labs, notes, testing and imaging myself where available.  Lab Results  Component Value Date   WBC 9.3 06/28/2018   HGB 15.1 (H) 06/28/2018   HCT 45.4 06/28/2018   MCV 101.3 (H) 06/28/2018   PLT 333 06/28/2018      Component Value Date/Time   NA 140 01/20/2019 1126   K 4.2 01/20/2019 1126   CL 105 01/20/2019 1126   CO2 23 01/20/2019 1126   GLUCOSE 87 01/20/2019 1126   GLUCOSE 83 06/28/2018 1459   BUN 19 01/20/2019 1126   CREATININE 0.99 01/20/2019 1126   CALCIUM 9.5 01/20/2019 1126   PROT 6.6 03/09/2014 1440   ALBUMIN 4.3 03/09/2014 1440   AST 16 03/09/2014 1440   ALT 12 03/09/2014 1440   ALKPHOS 70 03/09/2014 1440   BILITOT 0.3 03/09/2014 1440   GFRNONAA 66 01/20/2019 1126   GFRAA 76 01/20/2019 1126   Lab Results  Component Value Date   CHOL 250 (H) 12/25/2019   HDL 38 (L) 12/25/2019   LDLCALC 163 (H) 12/25/2019   TRIG 260 (H) 12/25/2019   CHOLHDL 6.6 (H) 12/25/2019   Lab Results  Component Value Date   HGBA1C 5.4 01/20/2019  Lab Results  Component Value Date   VQXIHWTU88 280 08/01/2018   Lab Results  Component Value Date   TSH 0.730 03/09/2014      ASSESSMENT AND PLAN 54 y.o. year old female  has a past medical history of Anxiety, Atypical chest pain, Barrett esophagus, Carpal tunnel syndrome, Dysrhythmia, GERD (gastroesophageal reflux disease), Headache(784.0) (03/09/2014), Heart murmur, Hiatal  hernia, Hyperlipidemia, Hypertension, Osteopenia, Seizures ([place]) (09/27/2012), Shingles, Sleep apnea, and Sleep disorder (09/27/2012). here with:  1. Seizures  Continue xanax if needed for anxiety  Advised if symptoms worsen or she develops new symptoms she should let us know Follow-up in 6 months or sooner if needed    I spent25 minutes of face-to-face and non-face-to-face time with patient.  This included previsit chart review, lab review, study review, order entry, electronic health record documentation, patient education.  Ward Givens, MSN, NP-C 03/10/2020, 1:35 PM [facility] [address] [place], [place] [address] [phone]

## 2020-03-10 NOTE — Patient Instructions (Signed)
Your Plan:  Continue Xanax If your symptoms worsen or you develop new symptoms please let us know.   Thank you for coming to see [name] at [facility]. I hope we have been able to provide you high quality care today.  You may receive a patient satisfaction survey over the next few weeks. We would appreciate your feedback and comments so that we may continue to improve ourselves and the health of our patients.

## 2020-03-13 DIAGNOSIS — Z23 Encounter for immunization: Secondary | ICD-10-CM | POA: Diagnosis not present

## 2020-03-19 ENCOUNTER — Encounter: Payer: Self-pay | Admitting: Internal Medicine

## 2020-03-19 ENCOUNTER — Ambulatory Visit (INDEPENDENT_AMBULATORY_CARE_PROVIDER_SITE_OTHER): Payer: Medicare HMO | Admitting: Internal Medicine

## 2020-03-19 ENCOUNTER — Other Ambulatory Visit: Payer: Self-pay

## 2020-03-19 VITALS — BP 120/80 | HR 93 | Ht 61.5 in | Wt 117.0 lb

## 2020-03-19 DIAGNOSIS — E7800 Pure hypercholesterolemia, unspecified: Secondary | ICD-10-CM

## 2020-03-19 DIAGNOSIS — F17200 Nicotine dependence, unspecified, uncomplicated: Secondary | ICD-10-CM

## 2020-03-19 DIAGNOSIS — T466X5A Adverse effect of antihyperlipidemic and antiarteriosclerotic drugs, initial encounter: Secondary | ICD-10-CM | POA: Diagnosis not present

## 2020-03-19 DIAGNOSIS — M791 Myalgia, unspecified site: Secondary | ICD-10-CM

## 2020-03-19 DIAGNOSIS — R072 Precordial pain: Secondary | ICD-10-CM

## 2020-03-19 MED ORDER — EZETIMIBE 10 MG PO TABS
10.0000 mg | ORAL_TABLET | Freq: Every day | ORAL | 3 refills | Status: DC
Start: 2020-03-19 — End: 2021-03-07

## 2020-03-19 NOTE — Patient Instructions (Addendum)
Medication Instructions:  START zetia 10mg  daily Continue all other current medications  *If you need a refill on your cardiac medications before your next appointment, please call your pharmacy*   Lab Work: FASTING lipid panel in 3 months to check cholesterol   If you have labs (blood work) drawn today and your tests are completely normal, you will receive your results only by: Marland Kitchen MyChart Message (if you have MyChart) OR . A paper copy in the mail If you have any lab test that is abnormal or we need to change your treatment, we will call you to review the results.   Testing/Procedures: NONE   Follow-Up: At [facility], you and your health needs are our priority.  As part of our continuing mission to provide you with exceptional heart care, we have created designated Provider Care Teams.  These Care Teams include your primary Cardiologist (physician) and Advanced Practice Providers (APPs -  Physician Assistants and Nurse Practitioners) who all work together to provide you with the care you need, when you need it.  We recommend signing up for the patient portal called "MyChart".  Sign up information is provided on this After Visit Summary.  MyChart is used to connect with patients for Virtual Visits (Telemedicine).  Patients are able to view lab/test results, encounter notes, upcoming appointments, etc.  Non-urgent messages can be sent to your provider as well.   To learn more about what you can do with MyChart, go to [email or web address].    Your next appointment:   3-4 month(s) - lipid clinic  The format for your next appointment:   In Person  Provider:   K. Mali Hilty, MD   Other Instructions

## 2020-03-19 NOTE — Progress Notes (Signed)
LIPID CLINIC CONSULT NOTE  Chief Complaint:  Manage dyslipidemia  Primary Care Physician: Street, Sharon Mt, MD  Primary Cardiologist:  No primary care provider on file.  HPI:  Crystal Lyons is a 54 y.o. female who is being seen today for the evaluation of dyslipidemia at the request of Park Liter, MD.  This is a pleasant 54 year old female kindly referred by Dr. Raliegh Ip for evaluation management of dyslipidemia.  She has a history of what was described as atypical chest pain, Barrett's esophagus, GERD and a hiatal hernia.  She has been describing pain that is in the center to the left side of her chest and radiates back to her back.  Is worse with exertion sometimes relieved by rest.  She says it also comes on after eating.  She had an echo in December which was essentially normal but has not had stress testing in quite some time.  She is a longstanding smoker and is working on cessation.  There is a family history of heart disease including her mother apparently who has PAD and "heart failure".  She has seizures and is followed by neurology.  She is noted to have a dyslipidemia and has been intolerant to statins causing significant myalgias.  She was noted to have some aortic atherosclerosis and was started on rosuvastatin high-dose which caused her myalgias and then she was switched to atorvastatin with similar symptoms.  She was then placed on pravastatin but can only take 20 mg on Sunday and Wednesday.  Based on this her lipids in July of this year showed total cholesterol 250, triglycerides 260, HDL 38 and LDL 163.  She reports she is recently lost significant amount of weight because every time she eats she has pain.  Based on this, she is significantly decreased her oral intake.  PMHx:  Past Medical History:  Diagnosis Date  . Anxiety   . Atypical chest pain   . Barrett esophagus   . Carpal tunnel syndrome    right and left wrists  . Dysrhythmia   . GERD (gastroesophageal  reflux disease)   . Headache(784.0) 03/09/2014  . Heart murmur   . Hiatal hernia   . Hyperlipidemia   . Hypertension   . Osteopenia   . Seizures ([place]) 09/27/2012  . Shingles   . Sleep apnea   . Sleep disorder 09/27/2012    Past Surgical History:  Procedure Laterality Date  . BRAIN SURGERY  01-06-85  . BRAIN SURGERY Right 1986   "right temporal lobe"  . BREAST BIOPSY    . CHOLECYSTECTOMY    . CYST REMOVAL TRUNK    . ears    . EYE SURGERY     cyst removed  . GALLBLADDER SURGERY N/A 2009  . NECK SURGERY    . SHOULDER SURGERY Bilateral 2009, 2010  . VULVECTOMY N/A 07/04/2018   Procedure: WIDE EXCISION VULVECTOMY;  Surgeon: Vanessa Kick, MD;  Location: [facility];  Service: Gynecology;  Laterality: N/A;    FAMHx:  Family History  Problem Relation Age of Onset  . Hypertension Father   . Cancer Father   . Diabetes Father   . Heart failure Father   . Cancer Mother   . Thyroid disease Mother   . Rheum arthritis Mother   . Stroke Mother   . Hypertension Mother   . Cirrhosis Sister     SOCHx:   reports that she has been smoking cigarettes. She has been smoking about 1.50 packs per day. She  has quit using smokeless tobacco.  Her smokeless tobacco use included chew. She reports that she does not drink alcohol and does not use drugs.  ALLERGIES:  Allergies  Allergen Reactions  . Adenosine Anaphylaxis    Heart block  . Azithromycin Swelling  . Aleve [Naproxen Sodium]   . Erythromycin Base Hives and Swelling  . Feldene [Piroxicam]   . Keflex [Cephalexin]   . Mobic [Meloxicam]   . Neurontin [Gabapentin]   . Other     Any anti-inflammatory  . Penicillins Other (See Comments)    Welts DID THE REACTION INVOLVE: Swelling of the face/tongue/throat, SOB, or low BP? Unknown Sudden or severe rash/hives, skin peeling, or the inside of the mouth or nose? Unknown Did it require medical treatment? Unknown When did it last happen?Childhood allergy If all above answers are "NO",  may proceed with cephalosporin use.   . Tape     Medical adhesive tape  . Vioxx [Rofecoxib]     ROS: A comprehensive review of systems was negative.  HOME MEDS: Current Outpatient Medications on File Prior to Visit  Medication Sig Dispense Refill  . albuterol (PROVENTIL HFA;VENTOLIN HFA) 108 (90 Base) MCG/ACT inhaler Inhale 2 puffs into the lungs every 6 (six) hours as needed for wheezing or shortness of breath.    . ALPRAZolam (XANAX) 0.5 MG tablet TAKE 1 TABLET BY MOUTH 2 TIMES DAILY AS NEEDED FOR ANXIETY 60 tablet 5  . denosumab (PROLIA) 60 MG/ML SOSY injection INJECT 60MG  (1ML) SUBCUTANEOUSLY EVERY SIX MONTHS    . famotidine (PEPCID) 40 MG tablet Take 40 mg by mouth every other day.    Marland Kitchen HYDROcodone-acetaminophen (NORCO/VICODIN) 5-325 MG per tablet Take 1 tablet by mouth 4 (four) times daily.     . hydrOXYzine (ATARAX/VISTARIL) 25 MG tablet Take 25 mg by mouth at bedtime as needed (sleep).     . lansoprazole (PREVACID) 30 MG capsule Take 30 mg by mouth daily.     Marland Kitchen NASONEX 50 MCG/ACT nasal spray Place 2 sprays into the nose as needed.     Marland Kitchen spironolactone-hydrochlorothiazide (ALDACTAZIDE) 25-25 MG tablet Take 1 tablet by mouth daily.    Marland Kitchen tiotropium (SPIRIVA HANDIHALER) 18 MCG inhalation capsule Place 18 mcg into inhaler and inhale as needed.     Marland Kitchen tiZANidine (ZANAFLEX) 4 MG tablet Take 4 mg by mouth every 8 (eight) hours as needed for muscle spasms.     Marland Kitchen VIVELLE-DOT 0.05 MG/24HR Place 1 patch onto the skin 2 (two) times a week.     . promethazine (PHENERGAN) 12.5 MG tablet Take 12.5 mg by mouth every 8 (eight) hours as needed for nausea or vomiting.      No current facility-administered medications on file prior to visit.    LABS/IMAGING: No results found for this or any previous visit (from the past 48 hour(s)). No results found.  LIPID PANEL:    Component Value Date/Time   CHOL 250 (H) 12/25/2019 1404   TRIG 260 (H) 12/25/2019 1404   HDL 38 (L) 12/25/2019 1404    CHOLHDL 6.6 (H) 12/25/2019 1404   LDLCALC 163 (H) 12/25/2019 1404    WEIGHTS: Wt Readings from Last 3 Encounters:  03/19/20 117 lb (53.1 kg)  03/10/20 119 lb (54 kg)  02/12/20 121 lb (54.9 kg)    VITALS: BP 120/80   Pulse 93   Ht 5' 1.5" (1.562 m)   Wt 117 lb (53.1 kg)   SpO2 98%   BMI 21.75 kg/m   EXAM: General appearance:  alert, no distress and thin Neck: no carotid bruit, no JVD and thyroid not enlarged, symmetric, no tenderness/mass/nodules Lungs: diminished breath sounds bilaterally Heart: regular rate and rhythm Abdomen: soft, non-tender; bowel sounds normal; no masses,  no organomegaly Extremities: extremities normal, atraumatic, no cyanosis or edema Pulses: 2+ and symmetric Skin: Skin color, texture, turgor normal. No rashes or lesions Neurologic: Grossly normal Psych: Pleasant  EKG: Deferred  ASSESSMENT: 1. Mixed dyslipidemia 2. Statin intolerant-myalgias 3. Tobacco abuse 4. Chest pain/dyspnea  PLAN: 1.   Crystal Lyons has a mixed dyslipidemia and has been statin intolerant.  She apparently has a history of some aortic atherosclerosis however it is difficult for me to see imaging supporting this.  She had an echo which showed normal systolic function back in December.  She is not had stress testing in some time.  I am concerned about her complaints of chest pain which radiates to her back and is in the mid center of her chest to the left side and worse with exertion but also worse after eating.  She says she gets no relief with antiacid's and despite recommendations by her GI doctor to lose weight, which she has, the symptoms have not improved.  Her family history is significant for heart disease and I think it is reasonable to consider further evaluation possibly with stress testing or CT coronary angiography.  I will discuss this with her primary cardiologist.  As to her lipids, would recommend starting Zetia in addition to her low-dose pravastatin.  She took Zetia  number of years ago but it was stopped due to being recalled.  She said she tolerated it well.  Ultimately if we can demonstrate more atherosclerosis, then her LDL targets will be lower and she may be a candidate for PCSK9 inhibitor.  Thanks for the kind referral.  Follow-up with me in about 3 months.  Pixie Casino, MD, [facility], [place] Director of the Advanced Lipid Disorders &  Cardiovascular Risk Reduction Clinic Diplomate of the American Board of Clinical Lipidology Attending Cardiologist  Direct Dial: [phone]  Fax: [phone]  Website:  [email or web address] 03/19/2020, 4:29 PM

## 2020-04-16 DIAGNOSIS — H524 Presbyopia: Secondary | ICD-10-CM | POA: Diagnosis not present

## 2020-04-19 DIAGNOSIS — M5137 Other intervertebral disc degeneration, lumbosacral region: Secondary | ICD-10-CM | POA: Diagnosis not present

## 2020-04-19 DIAGNOSIS — Z5181 Encounter for therapeutic drug level monitoring: Secondary | ICD-10-CM | POA: Diagnosis not present

## 2020-04-19 DIAGNOSIS — G894 Chronic pain syndrome: Secondary | ICD-10-CM | POA: Diagnosis not present

## 2020-04-19 DIAGNOSIS — M4722 Other spondylosis with radiculopathy, cervical region: Secondary | ICD-10-CM | POA: Diagnosis not present

## 2020-04-30 DIAGNOSIS — Z01419 Encounter for gynecological examination (general) (routine) without abnormal findings: Secondary | ICD-10-CM | POA: Diagnosis not present

## 2020-05-03 DIAGNOSIS — Z7189 Other specified counseling: Secondary | ICD-10-CM | POA: Diagnosis not present

## 2020-05-03 DIAGNOSIS — M810 Age-related osteoporosis without current pathological fracture: Secondary | ICD-10-CM | POA: Diagnosis not present

## 2020-05-03 DIAGNOSIS — B029 Zoster without complications: Secondary | ICD-10-CM | POA: Insufficient documentation

## 2020-05-03 DIAGNOSIS — K219 Gastro-esophageal reflux disease without esophagitis: Secondary | ICD-10-CM | POA: Insufficient documentation

## 2020-05-03 DIAGNOSIS — E559 Vitamin D deficiency, unspecified: Secondary | ICD-10-CM | POA: Diagnosis not present

## 2020-05-03 DIAGNOSIS — K449 Diaphragmatic hernia without obstruction or gangrene: Secondary | ICD-10-CM | POA: Insufficient documentation

## 2020-06-22 ENCOUNTER — Other Ambulatory Visit: Payer: Self-pay | Admitting: Adult Health

## 2020-07-07 ENCOUNTER — Other Ambulatory Visit: Payer: Self-pay | Admitting: Obstetrics and Gynecology

## 2020-07-07 DIAGNOSIS — Z1231 Encounter for screening mammogram for malignant neoplasm of breast: Secondary | ICD-10-CM

## 2020-07-15 ENCOUNTER — Other Ambulatory Visit: Payer: Self-pay

## 2020-07-15 ENCOUNTER — Ambulatory Visit: Payer: Medicare HMO | Admitting: Internal Medicine

## 2020-07-15 ENCOUNTER — Encounter: Payer: Self-pay | Admitting: Cardiology

## 2020-07-15 ENCOUNTER — Ambulatory Visit: Payer: Medicare HMO | Admitting: Cardiology

## 2020-07-15 VITALS — BP 112/64 | HR 87 | Ht 61.0 in | Wt 116.0 lb

## 2020-07-15 DIAGNOSIS — I10 Essential (primary) hypertension: Secondary | ICD-10-CM

## 2020-07-15 DIAGNOSIS — K22719 Barrett's esophagus with dysplasia, unspecified: Secondary | ICD-10-CM

## 2020-07-15 DIAGNOSIS — R0789 Other chest pain: Secondary | ICD-10-CM | POA: Diagnosis not present

## 2020-07-15 DIAGNOSIS — E785 Hyperlipidemia, unspecified: Secondary | ICD-10-CM | POA: Diagnosis not present

## 2020-07-15 MED ORDER — METOPROLOL TARTRATE 100 MG PO TABS: 100.0000 mg | ORAL_TABLET | Freq: Once | ORAL | 0 refills | Status: DC

## 2020-07-15 NOTE — Patient Instructions (Signed)
Medication Instructions:  Your physician recommends that you continue on your current medications as directed. Please refer to the Current Medication list given to you today.   *If you need a refill on your cardiac medications before your next appointment, please call your pharmacy*   Lab Work: Your physician recommends that you return for lab work today: bmp, lipid  If you have labs (blood work) drawn today and your tests are completely normal, you will receive your results only by: Marland Kitchen MyChart Message (if you have MyChart) OR . A paper copy in the mail If you have any lab test that is abnormal or we need to change your treatment, we will call you to review the results.   Testing/Procedures: Your cardiac CT will be scheduled at one of the below locations:   [facility] [address] [place], [place] [address] [phone]  [facility] [address] [place], [place] [address] [phone]  If scheduled at [facility], please arrive at the [facility] main entrance of [facility] 30 minutes prior to test start time. Proceed to the [facility] Radiology Department (first floor) to check-in and test prep.  If scheduled at [facility], please arrive 15 mins early for check-in and test prep.  Please follow these instructions carefully (unless otherwise directed):   On the Night Before the Test: . Be sure to Drink plenty of water. . Do not consume any caffeinated/decaffeinated beverages or chocolate 12 hours prior to your test. . Do not take any antihistamines 12 hours prior to your test.   On the Day of the Test: . Drink plenty of water. Do not drink any water within one hour of the test. . Do not eat any food 4 hours prior to the test. . You may take your regular medications prior to the test.  . Take metoprolol (Lopressor) two hours prior to test. . HOLD  Hydrochlorothiazide morning of the test. . FEMALES- please wear underwire-free bra if available         After the Test: . Drink plenty of water. . After receiving IV contrast, you may experience a mild flushed feeling. This is normal. . On occasion, you may experience a mild rash up to 24 hours after the test. This is not dangerous. If this occurs, you can take Benadryl 25 mg and increase your fluid intake. . If you experience trouble breathing, this can be serious. If it is severe call 911 IMMEDIATELY. If it is mild, please call our office. . If you take any of these medications: Glipizide/Metformin, Avandament, Glucavance, please do not take 48 hours after completing test unless otherwise instructed.   Once we have confirmed authorization from your insurance company, we will call you to set up a date and time for your test. Based on how quickly your insurance processes prior authorizations requests, please allow up to 4 weeks to be contacted for scheduling your Cardiac CT appointment. Be advised that routine Cardiac CT appointments could be scheduled as many as 8 weeks after your provider has ordered it.  For non-scheduling related questions, please contact the cardiac imaging nurse navigator should you have any questions/concerns: Marchia Bond, Cardiac Imaging Nurse Navigator Burley Saver, Interim Cardiac Imaging Nurse [place] [facility] Direct Office Dial: [phone]   For scheduling needs, including cancellations and rescheduling, please call [place], [phone].     Follow-Up: At [facility], you and your  health needs are our priority.  As part of our continuing mission to provide you with exceptional heart care, we have created designated Provider Care Teams.  These Care Teams include your primary Cardiologist (physician) and Advanced Practice Providers (APPs -  Physician Assistants and Nurse Practitioners) who all work together to provide you  with the care you need, when you need it.  We recommend signing up for the patient portal called "MyChart".  Sign up information is provided on this After Visit Summary.  MyChart is used to connect with patients for Virtual Visits (Telemedicine).  Patients are able to view lab/test results, encounter notes, upcoming appointments, etc.  Non-urgent messages can be sent to your provider as well.   To learn more about what you can do with MyChart, go to [email or web address].    Your next appointment:   3 month(s)  The format for your next appointment:   In Person  Provider:   Jenne Campus, MD   Other Instructions   Cardiac CT Angiogram A cardiac CT angiogram is a procedure to look at the heart and the area around the heart. It may be done to help find the cause of chest pains or other symptoms of heart disease. During this procedure, a substance called contrast dye is injected into the blood vessels in the area to be checked. A large X-ray machine, called a CT scanner, then takes detailed pictures of the heart and the surrounding area. The procedure is also sometimes called a coronary CT angiogram, coronary artery scanning, or CTA. A cardiac CT angiogram allows the health care provider to see how well blood is flowing to and from the heart. The health care provider will be able to see if there are any problems, such as:  Blockage or narrowing of the coronary arteries in the heart.  Fluid around the heart.  Signs of weakness or disease in the muscles, valves, and tissues of the heart. Tell a health care provider about:  Any allergies you have. This is especially important if you have had a previous allergic reaction to contrast dye.  All medicines you are taking, including vitamins, herbs, eye drops, creams, and over-the-counter medicines.  Any blood disorders you have.  Any surgeries you have had.  Any medical conditions you have.  Whether you are pregnant or may be  pregnant.  Any anxiety disorders, chronic pain, or other conditions you have that may increase your stress or prevent you from lying still. What are the risks? Generally, this is a safe procedure. However, problems may occur, including:  Bleeding.  Infection.  Allergic reactions to medicines or dyes.  Damage to other structures or organs.  Kidney damage from the contrast dye that is used.  Increased risk of cancer from radiation exposure. This risk is low. Talk with your health care provider about: ? The risks and benefits of testing. ? How you can receive the lowest dose of radiation. What happens before the procedure?  Wear comfortable clothing and remove any jewelry, glasses, dentures, and hearing aids.  Follow instructions from your health care provider about eating and drinking. This may include: ? For 12 hours before the procedure - avoid caffeine. This includes tea, coffee, soda, energy drinks, and diet pills. Drink plenty of water or other fluids that do not have caffeine in them. Being well hydrated can prevent complications. ? For 4-6 hours before the procedure - stop eating and drinking. The contrast dye can cause nausea, but this is less likely if your  stomach is empty.  Ask your health care provider about changing or stopping your regular medicines. This is especially important if you are taking diabetes medicines, blood thinners, or medicines to treat problems with erections (erectile dysfunction). What happens during the procedure?  Hair on your chest may need to be removed so that small sticky patches called electrodes can be placed on your chest. These will transmit information that helps to monitor your heart during the procedure.  An IV will be inserted into one of your veins.  You might be given a medicine to control your heart rate during the procedure. This will help to ensure that good images are obtained.  You will be asked to lie on an exam table. This  table will slide in and out of the CT machine during the procedure.  Contrast dye will be injected into the IV. You might feel warm, or you may get a metallic taste in your mouth.  You will be given a medicine called nitroglycerin. This will relax or dilate the arteries in your heart.  The table that you are lying on will move into the CT machine tunnel for the scan.  The person running the machine will give you instructions while the scans are being done. You may be asked to: ? Keep your arms above your head. ? Hold your breath. ? Stay very still, even if the table is moving.  When the scanning is complete, you will be moved out of the machine.  The IV will be removed. The procedure may vary among health care providers and hospitals.   What can I expect after the procedure? After your procedure, it is common to have:  A metallic taste in your mouth from the contrast dye.  A feeling of warmth.  A headache from the nitroglycerin. Follow these instructions at home:  Take over-the-counter and prescription medicines only as told by your health care provider.  If you are told, drink enough fluid to keep your urine pale yellow. This will help to flush the contrast dye out of your body.  Most people can return to their normal activities right after the procedure. Ask your health care provider what activities are safe for you.  It is up to you to get the results of your procedure. Ask your health care provider, or the department that is doing the procedure, when your results will be ready.  Keep all follow-up visits as told by your health care provider. This is important. Contact a health care provider if:  You have any symptoms of allergy to the contrast dye. These include: ? Shortness of breath. ? Rash or hives. ? A racing heartbeat. Summary  A cardiac CT angiogram is a procedure to look at the heart and the area around the heart. It may be done to help find the cause of chest  pains or other symptoms of heart disease.  During this procedure, a large X-ray machine, called a CT scanner, takes detailed pictures of the heart and the surrounding area after a contrast dye has been injected into blood vessels in the area.  Ask your health care provider about changing or stopping your regular medicines before the procedure. This is especially important if you are taking diabetes medicines, blood thinners, or medicines to treat erectile dysfunction.  If you are told, drink enough fluid to keep your urine pale yellow. This will help to flush the contrast dye out of your body. This information is not intended to replace advice  given to you by your health care provider. Make sure you discuss any questions you have with your health care provider. Document Revised: 01/29/2019 Document Reviewed: 01/29/2019 Elsevier Patient Education  2021 [employer].

## 2020-07-15 NOTE — Progress Notes (Unsigned)
Cardiology Office Note:    Date:  07/15/2020   ID:  Crystal Lyons, DOB 1965-07-05, MRN [id number]  PCP:  Street, Sharon Mt, MD  Cardiologist:  Jenne Campus, MD    Referring MD: Street, Sharon Mt, *   Chief Complaint  Patient presents with  . Chest Pain  . Leg Swelling    History of Present Illness:    Crystal Lyons is a 55 y.o. female with past medical history significant for essential hypertension, heavy smoking, dyslipidemia, atypical chest pain, Barrett's esophagus.  She comes today to my office for follow-up overall she still complain of having some chest pain epigastric pain that pain typically happens at rest after she eats but sometimes she gets this pain during the exercise.  She still continues to smoke trying to cut down but still a problem.  She was referred to lipid clinic for consideration of her cholesterol which is still not well controlled.  She does have multiple risk factors for coronary artery disease namely smoking which is quite heavy and uncontrolled cholesterol.  Past Medical History:  Diagnosis Date  . Anxiety   . Anxiety disorder 08/19/2013  . Atypical chest pain   . Barrett esophagus   . Carpal tunnel syndrome    right and left wrists  . Cervical post-laminectomy syndrome 08/19/2013  . Chronic headache disorder 05/08/2016  . Chronic neck pain 03/15/2015  . DDD (degenerative disc disease), lumbosacral 08/19/2013  . Disorder of bone and cartilage 04/21/2013  . Dyslipidemia 01/21/2015  . Dysrhythmia   . Encounter for therapeutic drug monitoring 05/01/2019  . Epilepsy ([place]) 02/24/2014  . Essential hypertension 11/20/2017  . GERD (gastroesophageal reflux disease)   . Headache 03/09/2014  . Headache(784.0) 03/09/2014  . Heart murmur   . Hiatal hernia   . Hypercholesterolemia 11/23/2012  . Hyperlipidemia   . Hypertension   . Low back pain 11/23/2012  . Lumbar radicular pain 04/21/2013  . Myalgia and myositis 08/19/2013  . Osteoarthritis of spine with  radiculopathy, 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/15/2015  . Osteopenia   . Seizure disorder ([place]) 08/19/2013  . Seizures ([place]) 09/27/2012  . Shingles   . Sinus tachycardia 08/16/2018  . Sleep apnea   . Sleep disorder 09/27/2012  . Smoking 11/20/2017  . Vitamin D deficiency 02/24/2014    Past Surgical History:  Procedure Laterality Date  . BRAIN SURGERY  01-06-85  . BRAIN SURGERY Right 1986   "right temporal lobe"  . BREAST BIOPSY    . CHOLECYSTECTOMY    . CYST REMOVAL TRUNK    . ears    . EYE SURGERY     cyst removed  . GALLBLADDER SURGERY N/A 2009  . NECK SURGERY    . SHOULDER SURGERY Bilateral 2009, 2010  . VULVECTOMY N/A 07/04/2018   Procedure: WIDE EXCISION VULVECTOMY;  Surgeon: Vanessa Kick, MD;  Location: [facility];  Service: Gynecology;  Laterality: N/A;    Current Medications: Current Meds  Medication Sig  . albuterol (PROVENTIL HFA;VENTOLIN HFA) 108 (90 Base) MCG/ACT inhaler Inhale 2 puffs into the lungs every 6 (six) hours as needed for wheezing or shortness of breath.  . ALPRAZolam (XANAX) 0.5 MG tablet TAKE 1 TABLET BY MOUTH 2 TIMES DAILY AS NEEDED FOR ANXIETY  . denosumab (PROLIA) 60 MG/ML SOSY injection INJECT 60MG  (1ML) SUBCUTANEOUSLY EVERY SIX MONTHS  . dicyclomine (BENTYL) 10 MG capsule Take 10 mg by mouth every 6 (six) hours as needed for spasms. For abdominal pain  . ezetimibe (ZETIA) 10 MG tablet Take  1 tablet (10 mg total) by mouth daily.  . famotidine (PEPCID) 40 MG tablet Take 40 mg by mouth every other day.  Marland Kitchen HYDROcodone-acetaminophen (NORCO/VICODIN) 5-325 MG per tablet Take 1 tablet by mouth 4 (four) times daily.   . hydrOXYzine (ATARAX/VISTARIL) 25 MG tablet Take 25 mg by mouth at bedtime as needed (sleep).   . lansoprazole (PREVACID) 30 MG capsule Take 30 mg by mouth daily.   Marland Kitchen NASONEX 50 MCG/ACT nasal spray Place 2 sprays into the nose as needed.   . pravastatin (PRAVACHOL) 20 MG tablet Take 20 mg by mouth once a week. 1 tab on Wednesday and Sunday  . promethazine  (PHENERGAN) 12.5 MG tablet Take 12.5 mg by mouth every 8 (eight) hours as needed for nausea or vomiting.   Marland Kitchen spironolactone-hydrochlorothiazide (ALDACTAZIDE) 25-25 MG tablet Take 1 tablet by mouth daily.  Marland Kitchen tiZANidine (ZANAFLEX) 4 MG tablet Take 4 mg by mouth every 8 (eight) hours as needed for muscle spasms.   Marland Kitchen VIVELLE-DOT 0.05 MG/24HR Place 1 patch onto the skin 2 (two) times a week.      Allergies:   Adenosine, Azithromycin, Aleve [naproxen sodium], Erythromycin base, Feldene [piroxicam], Keflex [cephalexin], Mobic [meloxicam], Neurontin [gabapentin], Other, Penicillins, Tape, and Vioxx [rofecoxib]   Social History   Socioeconomic History  . Marital status: Single    Spouse name: Not on file  . Number of children: 0  . Years of education: 8  . Highest education level: Not on file  Occupational History  . Occupation: Disabled  Tobacco Use  . Smoking status: Current Every Day Smoker    Packs/day: 1.50    Types: Cigarettes  . Smokeless tobacco: Former Systems developer    Types: Secondary school teacher  . Vaping Use: Former  Substance and Sexual Activity  . Alcohol use: No  . Drug use: No  . Sexual activity: Not on file  Other Topics Concern  . Not on file  Social History Narrative   Patient lives at home mother    Patient has no children.    Patient is single.    Patient has a 8th grade education.    Patient is disabled         Social Determinants of Radio broadcast assistant Strain: Not on file  Food Insecurity: Not on file  Transportation Needs: Not on file  Physical Activity: Not on file  Stress: Not on file  Social Connections: Not on file     Family History: The patient's family history includes Cancer in her father and mother; Cirrhosis in her sister; Diabetes in her father; Heart failure in her father; Hypertension in her father and mother; Rheum arthritis in her mother; Stroke in her mother; Thyroid disease in her mother. ROS:   Please see the history of present illness.     All 14 point review of systems negative except as described per history of present illness  EKGs/Labs/Other Studies Reviewed:      Recent Labs: No results found for requested labs within last 8760 hours.  Recent Lipid Panel    Component Value Date/Time   CHOL 250 (H) 12/25/2019 1404   TRIG 260 (H) 12/25/2019 1404   HDL 38 (L) 12/25/2019 1404   CHOLHDL 6.6 (H) 12/25/2019 1404   LDLCALC 163 (H) 12/25/2019 1404    Physical Exam:    VS:  BP 112/64 (BP Location: Left Arm, Patient Position: Sitting)   Pulse 87   Ht 5\' 1"  (1.549 m)   Wt 116  lb (52.6 kg)   SpO2 98%   BMI 21.92 kg/m     Wt Readings from Last 3 Encounters:  07/15/20 116 lb (52.6 kg)  03/19/20 117 lb (53.1 kg)  03/10/20 119 lb (54 kg)     GEN:  Well nourished, well developed in no acute distress HEENT: Normal NECK: No JVD; No carotid bruits LYMPHATICS: No lymphadenopathy CARDIAC: RRR, no murmurs, no rubs, no gallops RESPIRATORY:  Clear to auscultation without rales, wheezing or rhonchi  ABDOMEN: Soft, non-tender, non-distended MUSCULOSKELETAL:  No edema; No deformity  SKIN: Warm and dry LOWER EXTREMITIES: no swelling NEUROLOGIC:  Alert and oriented x 3 PSYCHIATRIC:  Normal affect   ASSESSMENT:    1. Essential hypertension   2. Dyslipidemia   3. Barrett's esophagus with dysplasia   4. Atypical chest pain    PLAN:    In order of problems listed above:  1. Atypical chest pain however some of the characteristic acts of this concerning.  I recommended to have coronary CT, I will not alter any of her medication right now also recommended for her to see a stomach specialist.  She does see Dr. Lyda Jester and advised her to get appointment with him. 2. Dyslipidemia will be followed by our lipid clinic. 3. Essential hypertension blood pressure well controlled. 4. Dyslipidemia: She requested to have her cholesterol checked again which I will do. 5. Smoking: We had a long discussion about this and I  strongly recommended her to quit.  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   Medication Adjustments/Labs and Tests Ordered: Current medicines are reviewed at length with the patient today.  Concerns regarding medicines are outlined above.  Orders Placed This Encounter  Procedures  . Basic metabolic panel  . Lipid panel  . EKG 12-Lead   Medication changes: No orders of the defined types were placed in this encounter.   Signed, Park Liter, MD, [facility] 07/15/2020 11:57 AM    [place]

## 2020-07-16 ENCOUNTER — Telehealth: Payer: Self-pay

## 2020-07-16 LAB — LIPID PANEL
Chol/HDL Ratio: 4.8 ratio — ABNORMAL HIGH (ref 0.0–4.4)
Cholesterol, Total: 215 mg/dL — ABNORMAL HIGH (ref 100–199)
HDL: 45 mg/dL (ref >39)
LDL Chol Calc (NIH): 147 mg/dL — ABNORMAL HIGH (ref 0–99)
Triglycerides: 130 mg/dL (ref 0–149)
VLDL Cholesterol Cal: 23 mg/dL (ref 5–40)

## 2020-07-16 LAB — BASIC METABOLIC PANEL
BUN/Creatinine Ratio: 10 (ref 9–23)
BUN: 11 mg/dL (ref 6–24)
CO2: 25 mmol/L (ref 20–29)
Calcium: 9.9 mg/dL (ref 8.7–10.2)
Chloride: 101 mmol/L (ref 96–106)
Creatinine, Ser: 1.1 mg/dL — ABNORMAL HIGH (ref 0.57–1.00)
GFR calc Af Amer: 66 mL/min/{1.73_m2} (ref >59)
GFR calc non Af Amer: 57 mL/min/{1.73_m2} — ABNORMAL LOW (ref >59)
Glucose: 84 mg/dL (ref 65–99)
Potassium: 4.7 mmol/L (ref 3.5–5.2)
Sodium: 140 mmol/L (ref 134–144)

## 2020-07-16 NOTE — Telephone Encounter (Signed)
-----   Message from Park Liter, MD sent at 07/16/2020 10:29 AM EST ----- Kidney function acceptable for coronary CT angio, lipid panel like always high cholesterol but she is always reluctant to take any medications will talk about this during next visit

## 2020-07-16 NOTE — Telephone Encounter (Signed)
Patient notified of results and verbalized understanding.  

## 2020-07-21 ENCOUNTER — Ambulatory Visit: Payer: Medicare HMO | Admitting: Internal Medicine

## 2020-07-21 ENCOUNTER — Other Ambulatory Visit: Payer: Self-pay

## 2020-07-21 ENCOUNTER — Telehealth: Payer: Self-pay | Admitting: Emergency Medicine

## 2020-07-21 ENCOUNTER — Encounter: Payer: Self-pay | Admitting: Internal Medicine

## 2020-07-21 VITALS — BP 104/70 | HR 78 | Ht 61.0 in | Wt 114.0 lb

## 2020-07-21 DIAGNOSIS — I10 Essential (primary) hypertension: Secondary | ICD-10-CM | POA: Diagnosis not present

## 2020-07-21 DIAGNOSIS — E785 Hyperlipidemia, unspecified: Secondary | ICD-10-CM | POA: Diagnosis not present

## 2020-07-21 DIAGNOSIS — T466X5D Adverse effect of antihyperlipidemic and antiarteriosclerotic drugs, subsequent encounter: Secondary | ICD-10-CM

## 2020-07-21 DIAGNOSIS — T466X5A Adverse effect of antihyperlipidemic and antiarteriosclerotic drugs, initial encounter: Secondary | ICD-10-CM

## 2020-07-21 DIAGNOSIS — M791 Myalgia, unspecified site: Secondary | ICD-10-CM | POA: Diagnosis not present

## 2020-07-21 DIAGNOSIS — Z9114 Patient's other noncompliance with medication regimen: Secondary | ICD-10-CM

## 2020-07-21 NOTE — Patient Instructions (Signed)
Medication Instructions:  TAKE zetia 10mg  every day  *If you need a refill on your cardiac medications before your next appointment, please call your pharmacy*   Lab Work: FASTING lipid panel in 6 months - due before next visit with Dr. Debara Pickett  If you have labs (blood work) drawn today and your tests are completely normal, you will receive your results only by: Marland Kitchen MyChart Message (if you have MyChart) OR . A paper copy in the mail If you have any lab test that is abnormal or we need to change your treatment, we will call you to review the results.   Testing/Procedures: Coronary CT as ordered by Dr. Raliegh Ip   Follow-Up: At [facility], you and your health needs are our priority.  As part of our continuing mission to provide you with exceptional heart care, we have created designated Provider Care Teams.  These Care Teams include your primary Cardiologist (physician) and Advanced Practice Providers (APPs -  Physician Assistants and Nurse Practitioners) who all work together to provide you with the care you need, when you need it.  We recommend signing up for the patient portal called "MyChart".  Sign up information is provided on this After Visit Summary.  MyChart is used to connect with patients for Virtual Visits (Telemedicine).  Patients are able to view lab/test results, encounter notes, upcoming appointments, etc.  Non-urgent messages can be sent to your provider as well.   To learn more about what you can do with MyChart, go to [email or web address].    Your next appointment:   6 month(s)  The format for your next appointment:   In Person  Provider:   Raliegh Ip Mali Hilty, MD - lipid clinic   Other Instructions

## 2020-07-21 NOTE — Progress Notes (Signed)
LIPID CLINIC CONSULT NOTE  Chief Complaint:  Manage dyslipidemia  Primary Care Physician: Street, Sharon Mt, MD  Primary Cardiologist:  No primary care provider on file.  HPI:  Crystal Lyons is a 55 y.o. female who is being seen today for the evaluation of dyslipidemia at the request of [address], Sharon Mt, *.  This is a pleasant 55 year old female kindly referred by Dr. Raliegh Ip for evaluation management of dyslipidemia.  She has a history of what was described as atypical chest pain, Barrett's esophagus, GERD and a hiatal hernia.  She has been describing pain that is in the center to the left side of her chest and radiates back to her back.  Is worse with exertion sometimes relieved by rest.  She says it also comes on after eating.  She had an echo in December which was essentially normal but has not had stress testing in quite some time.  She is a longstanding smoker and is working on cessation.  There is a family history of heart disease including her mother apparently who has PAD and "heart failure".  She has seizures and is followed by neurology.  She is noted to have a dyslipidemia and has been intolerant to statins causing significant myalgias.  She was noted to have some aortic atherosclerosis and was started on rosuvastatin high-dose which caused her myalgias and then she was switched to atorvastatin with similar symptoms.  She was then placed on pravastatin but can only take 20 mg on Sunday and Wednesday.  Based on this her lipids in July of this year showed total cholesterol 250, triglycerides 260, HDL 38 and LDL 163.  She reports she is recently lost significant amount of weight because every time she eats she has pain.  Based on this, she is significantly decreased her oral intake.  07/21/2020  Crystal Lyons returns today for follow-up.  She says that she is only been taking her ezetimibe when she remembers it, sometimes 2-3 times a week.  She is only taking the pravastatin twice a  week due to cramping in her legs.  She saw Dr. Raliegh Ip recently who had ordered her a CT coronary angiogram however that is not been scheduled.  She still having somewhat atypical chest discomfort.  Her lipids recently are somewhat better with total cholesterol 215, HDL 45, LDL 147 and triglycerides 130, but would be significantly better if she took the medicine on a daily basis.  PMHx:  Past Medical History:  Diagnosis Date  . Anxiety   . Anxiety disorder 08/19/2013  . Atypical chest pain   . Barrett esophagus   . Carpal tunnel syndrome    right and left wrists  . Cervical post-laminectomy syndrome 08/19/2013  . Chronic headache disorder 05/08/2016  . Chronic neck pain 03/15/2015  . DDD (degenerative disc disease), lumbosacral 08/19/2013  . Disorder of bone and cartilage 04/21/2013  . Dyslipidemia 01/21/2015  . Dysrhythmia   . Encounter for therapeutic drug monitoring 05/01/2019  . Epilepsy ([place]) 02/24/2014  . Essential hypertension 11/20/2017  . GERD (gastroesophageal reflux disease)   . Headache 03/09/2014  . Headache(784.0) 03/09/2014  . Heart murmur   . Hiatal hernia   . Hypercholesterolemia 11/23/2012  . Hyperlipidemia   . Hypertension   . Low back pain 11/23/2012  . Lumbar radicular pain 04/21/2013  . Myalgia and myositis 08/19/2013  . Osteoarthritis of spine with radiculopathy, 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/15/2015  . Osteopenia   . Seizure disorder ([place]) 08/19/2013  . Seizures ([place]) 09/27/2012  .  Shingles   . Sinus tachycardia 08/16/2018  . Sleep apnea   . Sleep disorder 09/27/2012  . Smoking 11/20/2017  . Vitamin D deficiency 02/24/2014    Past Surgical History:  Procedure Laterality Date  . BRAIN SURGERY  01-06-85  . BRAIN SURGERY Right 1986   "right temporal lobe"  . BREAST BIOPSY    . CHOLECYSTECTOMY    . CYST REMOVAL TRUNK    . ears    . EYE SURGERY     cyst removed  . GALLBLADDER SURGERY N/A 2009  . NECK SURGERY    . SHOULDER SURGERY Bilateral 2009, 2010  . VULVECTOMY N/A 07/04/2018    Procedure: WIDE EXCISION VULVECTOMY;  Surgeon: Vanessa Kick, MD;  Location: [facility];  Service: Gynecology;  Laterality: N/A;    FAMHx:  Family History  Problem Relation Age of Onset  . Hypertension Father   . Cancer Father   . Diabetes Father   . Heart failure Father   . Cancer Mother   . Thyroid disease Mother   . Rheum arthritis Mother   . Stroke Mother   . Hypertension Mother   . Cirrhosis Sister     SOCHx:   reports that she has been smoking cigarettes. She has been smoking about 1.50 packs per day. She has quit using smokeless tobacco.  Her smokeless tobacco use included chew. She reports that she does not drink alcohol and does not use drugs.  ALLERGIES:  Allergies  Allergen Reactions  . Adenosine Anaphylaxis    Heart block  . Azithromycin Swelling  . Aleve [Naproxen Sodium]   . Erythromycin Base Hives and Swelling  . Feldene [Piroxicam]   . Keflex [Cephalexin]   . Mobic [Meloxicam]   . Neurontin [Gabapentin]   . Other     Any anti-inflammatory  . Penicillins Other (See Comments)    [place]ts DID THE REACTION INVOLVE: Swelling of the face/tongue/throat, SOB, or low BP? Unknown Sudden or severe rash/hives, skin peeling, or the inside of the mouth or nose? Unknown Did it require medical treatment? Unknown When did it last happen?Childhood allergy If all above answers are "NO", may proceed with cephalosporin use.   . Tape     Medical adhesive tape  . Vioxx [Rofecoxib]     ROS: A comprehensive review of systems was negative.  HOME MEDS: Current Outpatient Medications on File Prior to Visit  Medication Sig Dispense Refill  . albuterol (PROVENTIL HFA;VENTOLIN HFA) 108 (90 Base) MCG/ACT inhaler Inhale 2 puffs into the lungs every 6 (six) hours as needed for wheezing or shortness of breath.    . ALPRAZolam (XANAX) 0.5 MG tablet TAKE 1 TABLET BY MOUTH 2 TIMES DAILY AS NEEDED FOR ANXIETY 60 tablet 5  . denosumab (PROLIA) 60 MG/ML SOSY injection INJECT 60MG  (1ML)  SUBCUTANEOUSLY EVERY SIX MONTHS    . dicyclomine (BENTYL) 10 MG capsule Take 10 mg by mouth every 6 (six) hours as needed for spasms. For abdominal pain    . famotidine (PEPCID) 40 MG tablet Take 40 mg by mouth every other day.    Marland Kitchen HYDROcodone-acetaminophen (NORCO/VICODIN) 5-325 MG per tablet Take 1 tablet by mouth 4 (four) times daily.     . hydrOXYzine (ATARAX/VISTARIL) 25 MG tablet Take 25 mg by mouth at bedtime as needed (sleep).     . lansoprazole (PREVACID) 30 MG capsule Take 30 mg by mouth daily.     Marland Kitchen NASONEX 50 MCG/ACT nasal spray Place 2 sprays into the nose as needed.     Marland Kitchen  pravastatin (PRAVACHOL) 20 MG tablet Take 20 mg by mouth once a week. 1 tab on Wednesday and Sunday    . spironolactone-hydrochlorothiazide (ALDACTAZIDE) 25-25 MG tablet Take 1 tablet by mouth daily.    Marland Kitchen tiZANidine (ZANAFLEX) 4 MG tablet Take 4 mg by mouth every 8 (eight) hours as needed for muscle spasms.     Marland Kitchen VIVELLE-DOT 0.05 MG/24HR Place 1 patch onto the skin 2 (two) times a week.     . ezetimibe (ZETIA) 10 MG tablet Take 1 tablet (10 mg total) by mouth daily. 90 tablet 3  . metoprolol tartrate (LOPRESSOR) 100 MG tablet Take 1 tablet (100 mg total) by mouth once for 1 dose. 2 hours before ct 1 tablet 0  . promethazine (PHENERGAN) 12.5 MG tablet Take 12.5 mg by mouth every 8 (eight) hours as needed for nausea or vomiting.      No current facility-administered medications on file prior to visit.    LABS/IMAGING: No results found for this or any previous visit (from the past 48 hour(s)). No results found.  LIPID PANEL:    Component Value Date/Time   CHOL 215 (H) 07/15/2020 1200   TRIG 130 07/15/2020 1200   HDL 45 07/15/2020 1200   CHOLHDL 4.8 (H) 07/15/2020 1200   LDLCALC 147 (H) 07/15/2020 1200    WEIGHTS: Wt Readings from Last 3 Encounters:  07/21/20 114 lb (51.7 kg)  07/15/20 116 lb (52.6 kg)  03/19/20 117 lb (53.1 kg)    VITALS: BP 104/70   Pulse 78   Ht 5\' 1"  (1.549 m)   Wt 114 lb  (51.7 kg)   SpO2 99%   BMI 21.54 kg/m   EXAM: Deferred  EKG: Deferred  ASSESSMENT: 1. Mixed dyslipidemia 2. Statin intolerant-myalgias 3. Tobacco abuse 4. Chest pain/dyspnea  PLAN: 1.   Crystal Lyons has had interval improvement in her lipids however has been intermittently taking the ezetimibe due to "forgetting it".  We talked about strategies to help with compliance including putting the medication with other pills that she uses a pill bucket for or setting reminders on her phone.  She only takes the pravastatin twice weekly as well.  I think it is reasonable to work on compliance with a plan of repeating her lipids in about 6 months.  A CT coronary angiogram is pending for her primary cardiologist.  If she has significant coronary disease, we may need to consider PCSK9 inhibitor however that would not be effective if she is noncompliant.  Follow-up with me in 6 months.  Pixie Casino, MD, [facility], [place] Director of the Advanced Lipid Disorders &  Cardiovascular Risk Reduction Clinic Diplomate of the American Board of Clinical Lipidology Attending Cardiologist  Direct Dial: [phone]  Fax: [phone]  Website:  [email or web address]  Nadean Corwin Hilty 07/21/2020, 2:28 PM

## 2020-07-21 NOTE — Telephone Encounter (Signed)
Alprazolam PA started on CMM.  Key [id number].  Awaiting determination from [facility].

## 2020-07-22 DIAGNOSIS — M5030 Other cervical disc degeneration, unspecified cervical region: Secondary | ICD-10-CM | POA: Diagnosis not present

## 2020-07-22 DIAGNOSIS — M961 Postlaminectomy syndrome, not elsewhere classified: Secondary | ICD-10-CM | POA: Diagnosis not present

## 2020-07-22 DIAGNOSIS — M5137 Other intervertebral disc degeneration, lumbosacral region: Secondary | ICD-10-CM | POA: Diagnosis not present

## 2020-07-22 NOTE — Telephone Encounter (Signed)
PA Case: [id number], Status: Approved, Coverage Starts on: 06/19/2020 12:00:00 AM, Coverage Ends on: 06/18/2021 12:00:00 AM. Questions? Contact [phone].

## 2020-08-18 ENCOUNTER — Other Ambulatory Visit: Payer: Self-pay

## 2020-08-18 ENCOUNTER — Ambulatory Visit
Admission: RE | Admit: 2020-08-18 | Discharge: 2020-08-18 | Disposition: A | Discharge status: Home / Self Care | Payer: Medicare HMO | Source: Ambulatory Visit | Attending: Obstetrics and Gynecology | Admitting: Obstetrics and Gynecology

## 2020-08-18 DIAGNOSIS — Z1231 Encounter for screening mammogram for malignant neoplasm of breast: Secondary | ICD-10-CM | POA: Diagnosis not present

## 2020-09-08 ENCOUNTER — Ambulatory Visit: Payer: Medicare HMO | Admitting: Adult Health

## 2020-09-08 ENCOUNTER — Encounter: Payer: Self-pay | Admitting: Adult Health

## 2020-09-08 VITALS — BP 114/72 | HR 80 | Ht 61.0 in | Wt 115.0 lb

## 2020-09-08 DIAGNOSIS — R569 Unspecified convulsions: Secondary | ICD-10-CM

## 2020-09-08 NOTE — Patient Instructions (Signed)
Your Plan:  Continue xanax twice a day as needed If your symptoms worsen or you develop new symptoms please let us know.   Thank you for coming to see [name] at [facility]. I hope we have been able to provide you high quality care today.  You may receive a patient satisfaction survey over the next few weeks. We would appreciate your feedback and comments so that we may continue to improve ourselves and the health of our patients.

## 2020-09-08 NOTE — Progress Notes (Signed)
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  She returns today for follow-up.  Reports that she continues to do well.  She denies any seizure events.  Continues to have anxiety that she feels is an aura to her seizures.  She takes Xanax twice a day.  Patient reports that she has lost 30 pounds.  Reports that she has not been eating because it hurts to eat.  Saw a GI specialist but never followed up.  The patient seems to have several health issues going on but does not follow-up with her PCP or specialist on a regular basis.  Encouraged her to schedule the appropriate follow-ups.  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  She returns today for follow-up.  She denies any seizure events.  Continues to get nervousness in the pit of her stomach that she describes as an aura.  She takes Xanax twice a day.  HISTORY 09/01/19: Crystal Lyons is a 55 year old female with a history of seizures, status post epilepsy surgery with resection of the anterior portion of the right temporal lobe.  She returns today for follow-up.  She she denies any seizure events.  She states that she will still get a nervousness in the pit of her stomach that she describes as an aura.  She takes Xanax approximately twice a day.  The patient has swelling around the left eye.  She states that she was cleaning out a filter under the house and developed swelling.  Notes that swelling started last week no trauma to the eye.  She has an appointment Thursday to see her PCP.  Denies any changes with her vision.  I did advise the patient that she should see her PCP sooner if  she develops new symptoms.  She reports "why? I do not have anything to live for."  States that her father passed away in 2023-02-25.  States that she does not have a great relationship with her mother and brother.  She does acknowledge that she is still going through the grieving process and is experiencing some depression.  She reports at times she has had thoughts of suicide but no plan to carry this out.  REVIEW OF SYSTEMS: Out of a complete 14 system review of symptoms, the patient complains only of the following symptoms, and all other reviewed systems are negative.  See HPI  ALLERGIES: Allergies  Allergen Reactions  . Adenosine Anaphylaxis    Heart block  . Azithromycin Swelling  . Aleve [Naproxen Sodium]   . Erythromycin Base Hives and Swelling  . Feldene [Piroxicam]   . Keflex [Cephalexin]   . Mobic [Meloxicam]   . Neurontin [Gabapentin]   . Other     Any anti-inflammatory  . Penicillins Other (See Comments)    Welts DID THE REACTION INVOLVE: Swelling of the face/tongue/throat, SOB, or low BP? Unknown Sudden or severe rash/hives, skin peeling, or the inside of the mouth or nose? Unknown Did it require medical treatment? Unknown When did it last happen?Childhood allergy If all above answers are "NO", may proceed with cephalosporin use.   . Tape     Medical adhesive tape  .  Vioxx [Rofecoxib]     HOME MEDICATIONS: Outpatient Medications Prior to Visit  Medication Sig Dispense Refill  . albuterol (PROVENTIL HFA;VENTOLIN HFA) 108 (90 Base) MCG/ACT inhaler Inhale 2 puffs into the lungs every 6 (six) hours as needed for wheezing or shortness of breath.    . ALPRAZolam (XANAX) 0.5 MG tablet TAKE 1 TABLET BY MOUTH 2 TIMES DAILY AS NEEDED FOR ANXIETY 60 tablet 5  . denosumab (PROLIA) 60 MG/ML SOSY injection INJECT 60MG  (1ML) SUBCUTANEOUSLY EVERY SIX MONTHS    . dicyclomine (BENTYL) 10 MG capsule Take 10 mg by mouth every 6 (six) hours as needed for spasms. For abdominal pain     . famotidine (PEPCID) 40 MG tablet Take 40 mg by mouth every other day.    Marland Kitchen HYDROcodone-acetaminophen (NORCO/VICODIN) 5-325 MG per tablet Take 1 tablet by mouth 4 (four) times daily.     . hydrOXYzine (ATARAX/VISTARIL) 25 MG tablet Take 25 mg by mouth at bedtime as needed (sleep).     . lansoprazole (PREVACID) 30 MG capsule Take 30 mg by mouth daily.     Marland Kitchen NASONEX 50 MCG/ACT nasal spray Place 2 sprays into the nose as needed.     Marland Kitchen spironolactone-hydrochlorothiazide (ALDACTAZIDE) 25-25 MG tablet Take 1 tablet by mouth daily.    Marland Kitchen VIVELLE-DOT 0.05 MG/24HR Place 1 patch onto the skin 2 (two) times a week.     . ezetimibe (ZETIA) 10 MG tablet Take 1 tablet (10 mg total) by mouth daily. 90 tablet 3  . metoprolol tartrate (LOPRESSOR) 100 MG tablet Take 1 tablet (100 mg total) by mouth once for 1 dose. 2 hours before ct 1 tablet 0  . promethazine (PHENERGAN) 12.5 MG tablet Take 12.5 mg by mouth every 8 (eight) hours as needed for nausea or vomiting.     . pravastatin (PRAVACHOL) 20 MG tablet Take 20 mg by mouth once a week. 1 tab on Wednesday and Sunday    . tiZANidine (ZANAFLEX) 4 MG tablet Take 4 mg by mouth every 8 (eight) hours as needed for muscle spasms.      No facility-administered medications prior to visit.    PAST MEDICAL HISTORY: Past Medical History:  Diagnosis Date  . Anxiety   . Anxiety disorder 08/19/2013  . Atypical chest pain   . Barrett esophagus   . Carpal tunnel syndrome    right and left wrists  . Cervical post-laminectomy syndrome 08/19/2013  . Chronic headache disorder 05/08/2016  . Chronic neck pain 03/15/2015  . DDD (degenerative disc disease), lumbosacral 08/19/2013  . Disorder of bone and cartilage 04/21/2013  . Dyslipidemia 01/21/2015  . Dysrhythmia   . Encounter for therapeutic drug monitoring 05/01/2019  . Epilepsy ([place]) 02/24/2014  . Essential hypertension 11/20/2017  . GERD (gastroesophageal reflux disease)   . Headache 03/09/2014  . Headache(784.0) 03/09/2014  .  Heart murmur   . Hiatal hernia   . Hypercholesterolemia 11/23/2012  . Hyperlipidemia   . Hypertension   . Low back pain 11/23/2012  . Lumbar radicular pain 04/21/2013  . Myalgia and myositis 08/19/2013  . Osteoarthritis of spine with radiculopathy, 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/15/2015  . Osteopenia   . Seizure disorder ([place]) 08/19/2013  . Seizures ([place]) 09/27/2012  . Shingles   . Sinus tachycardia 08/16/2018  . Sleep apnea   . Sleep disorder 09/27/2012  . Smoking 11/20/2017  . Vitamin D deficiency 02/24/2014    PAST SURGICAL HISTORY: Past Surgical History:  Procedure Laterality Date  . BRAIN SURGERY  01-06-85  .  BRAIN SURGERY Right 1986   "right temporal lobe"  . BREAST BIOPSY    . CHOLECYSTECTOMY    . CYST REMOVAL TRUNK    . ears    . EYE SURGERY     cyst removed  . GALLBLADDER SURGERY N/A 2009  . NECK SURGERY    . SHOULDER SURGERY Bilateral 2009, 2010  . VULVECTOMY N/A 07/04/2018   Procedure: WIDE EXCISION VULVECTOMY;  Surgeon: Vanessa Kick, MD;  Location: [facility];  Service: Gynecology;  Laterality: N/A;    FAMILY HISTORY: Family History  Problem Relation Age of Onset  . Hypertension Father   . Cancer Father   . Diabetes Father   . Heart failure Father   . Cancer Mother   . Thyroid disease Mother   . Rheum arthritis Mother   . Stroke Mother   . Hypertension Mother   . Cirrhosis Sister     SOCIAL HISTORY: Social History   Socioeconomic History  . Marital status: Single    Spouse name: Not on file  . Number of children: 0  . Years of education: 8  . Highest education level: Not on file  Occupational History  . Occupation: Disabled  Tobacco Use  . Smoking status: Current Every Day Smoker    Packs/day: 1.50    Types: Cigarettes  . Smokeless tobacco: Former Systems developer    Types: Secondary school teacher  . Vaping Use: Former  Substance and Sexual Activity  . Alcohol use: No  . Drug use: No  . Sexual activity: Not on file  Other Topics Concern  . Not on file  Social History Narrative    Patient lives at home mother    Patient has no children.    Patient is single.    Patient has a 8th grade education.    Patient is disabled         Social Determinants of Radio broadcast assistant Strain: Not on file  Food Insecurity: Not on file  Transportation Needs: Not on file  Physical Activity: Not on file  Stress: Not on file  Social Connections: Not on file  Intimate Partner Violence: Not on file      PHYSICAL EXAM  Vitals:   09/08/20 1354  BP: 114/72  Pulse: 80  Weight: 115 lb (52.2 kg)  Height: 5\' 1"  (1.549 m)   Body mass index is 21.73 kg/m.  Generalized: Well developed, in no acute distress   Neurological examination  Mentation: Alert oriented to time, place, history taking. Follows all commands speech and language fluent Cranial nerve II-XII: Pupils were equal round reactive to light. Extraocular movements were full, visual field were full on confrontational test. Facial sensation and strength were normal. Uvula tongue midline. Head turning and shoulder shrug  were normal and symmetric. Motor: The motor testing reveals 5 over 5 strength of all 4 extremities. Good symmetric motor tone is noted throughout.  Sensory: Sensory testing is intact to soft touch on all 4 extremities. No evidence of extinction is noted.  Coordination: Cerebellar testing reveals good finger-nose-finger and heel-to-shin bilaterally.  Gait and station: Gait is normal.    DIAGNOSTIC DATA (LABS, IMAGING, TESTING) - I reviewed patient records, labs, notes, testing and imaging myself where available.  Lab Results  Component Value Date   WBC 9.3 06/28/2018   HGB 15.1 (H) 06/28/2018   HCT 45.4 06/28/2018   MCV 101.3 (H) 06/28/2018   PLT 333 06/28/2018      Component Value Date/Time   NA  140 07/15/2020 1200   K 4.7 07/15/2020 1200   CL 101 07/15/2020 1200   CO2 25 07/15/2020 1200   GLUCOSE 84 07/15/2020 1200   GLUCOSE 83 06/28/2018 1459   BUN 11 07/15/2020 1200   CREATININE  1.10 (H) 07/15/2020 1200   CALCIUM 9.9 07/15/2020 1200   PROT 6.6 03/09/2014 1440   ALBUMIN 4.3 03/09/2014 1440   AST 16 03/09/2014 1440   ALT 12 03/09/2014 1440   ALKPHOS 70 03/09/2014 1440   BILITOT 0.3 03/09/2014 1440   GFRNONAA 57 (L) 07/15/2020 1200   GFRAA 66 07/15/2020 1200   Lab Results  Component Value Date   CHOL 215 (H) 07/15/2020   HDL 45 07/15/2020   LDLCALC 147 (H) 07/15/2020   TRIG 130 07/15/2020   CHOLHDL 4.8 (H) 07/15/2020   Lab Results  Component Value Date   HGBA1C 5.4 01/20/2019   Lab Results  Component Value Date   VITAMINB12 312 08/01/2018   Lab Results  Component Value Date   TSH 0.730 03/09/2014      ASSESSMENT AND PLAN 56 y.o. year old female  has a past medical history of Anxiety, Anxiety disorder (08/19/2013), Atypical chest pain, Barrett esophagus, Carpal tunnel syndrome, Cervical post-laminectomy syndrome (08/19/2013), Chronic headache disorder (05/08/2016), Chronic neck pain (03/15/2015), DDD (degenerative disc disease), lumbosacral (08/19/2013), Disorder of bone and cartilage (04/21/2013), Dyslipidemia (01/21/2015), Dysrhythmia, Encounter for therapeutic drug monitoring (05/01/2019), Epilepsy ([place]) (02/24/2014), Essential hypertension (11/20/2017), GERD (gastroesophageal reflux disease), Headache (03/09/2014), Headache(784.0) (03/09/2014), Heart murmur, Hiatal hernia, Hypercholesterolemia (11/23/2012), Hyperlipidemia, Hypertension, Low back pain (11/23/2012), Lumbar radicular pain (04/21/2013), Myalgia and myositis (08/19/2013), Osteoarthritis of spine with radiculopathy, cervical region (03/15/2015), Osteopenia, Seizure disorder ([place]) (08/19/2013), Seizures ([place]) (09/27/2012), Shingles, Sinus tachycardia (08/16/2018), Sleep apnea, Sleep disorder (09/27/2012), Smoking (11/20/2017), and Vitamin D deficiency (02/24/2014). here with:  1. Seizures  Patient has been stable for several years.  She will continue xanax as needed for anxiety.advised that if her PCP is amenable to take  over this prescription she can follow-up with her PCP however patient states that in the past her PCP did not want to take over these prescriptions.  She will follow-up in 6 months or sooner if needed   I spent 30 minutes of face-to-face and non-face-to-face time with patient.  This included previsit chart review, lab review, study review, order entry, electronic health record documentation, patient education.  Ward Givens, MSN, NP-C 09/08/2020, 2:05 PM [facility] [address], [place], [place] [address] [phone]

## 2020-09-12 NOTE — Progress Notes (Signed)
I have read the note, and I agree with the clinical assessment and plan.  Oria Klimas K Paulmichael Schreck   

## 2020-09-20 NOTE — Addendum Note (Signed)
Addended by: Senaida Ores on: 09/20/2020 03:34 PM   Modules accepted: Orders

## 2020-09-21 DIAGNOSIS — B373 Candidiasis of vulva and vagina: Secondary | ICD-10-CM | POA: Diagnosis not present

## 2020-09-21 DIAGNOSIS — Z113 Encounter for screening for infections with a predominantly sexual mode of transmission: Secondary | ICD-10-CM | POA: Diagnosis not present

## 2020-09-28 ENCOUNTER — Telehealth (HOSPITAL_COMMUNITY): Payer: Self-pay | Admitting: Emergency Medicine

## 2020-09-28 ENCOUNTER — Other Ambulatory Visit (HOSPITAL_COMMUNITY): Payer: Self-pay | Admitting: Emergency Medicine

## 2020-09-28 MED ORDER — IVABRADINE HCL 5 MG PO TABS: 15.0000 mg | ORAL_TABLET | Freq: Once | ORAL | 0 refills | Status: AC

## 2020-09-28 NOTE — Telephone Encounter (Signed)
Reaching out to patient to offer assistance regarding upcoming cardiac imaging study; pt verbalizes understanding of appt date/time, parking situation and where to check in, pre-test NPO status and medications ordered, and verified current allergies; name and call back number provided for further questions should they arise Crystal Bond RN Navigator Cardiac Imaging [facility] [phone] office [phone] cell  Informed her that I added 15mg  ivabradine to take with 100mg  metoprolol for HR control for CCTA Clarise Cruz

## 2020-09-29 ENCOUNTER — Other Ambulatory Visit: Payer: Self-pay

## 2020-09-29 ENCOUNTER — Ambulatory Visit (HOSPITAL_BASED_OUTPATIENT_CLINIC_OR_DEPARTMENT_OTHER)
Admission: RE | Admit: 2020-09-29 | Discharge: 2020-09-29 | Disposition: A | Discharge status: Home / Self Care | Payer: Medicare HMO | Source: Ambulatory Visit | Attending: Cardiology | Admitting: Cardiology

## 2020-09-29 DIAGNOSIS — I2510 Atherosclerotic heart disease of native coronary artery without angina pectoris: Secondary | ICD-10-CM | POA: Insufficient documentation

## 2020-09-29 DIAGNOSIS — R0789 Other chest pain: Secondary | ICD-10-CM | POA: Insufficient documentation

## 2020-09-29 LAB — POCT I-STAT CREATININE: Creatinine, Ser: 1 mg/dL (ref 0.44–1.00)

## 2020-09-29 MED ORDER — NITROGLYCERIN 0.4 MG SL SUBL: 0.8000 mg | SUBLINGUAL_TABLET | Freq: Once | SUBLINGUAL | Status: DC

## 2020-09-29 MED ORDER — IOHEXOL 350 MG/ML SOLN
95.0000 mL | Freq: Once | INTRAVENOUS | Status: AC | PRN
  Administered 2020-09-29: 95 mL via INTRAVENOUS

## 2020-09-29 MED ORDER — METOPROLOL TARTRATE 5 MG/5ML IV SOLN: 5.0000 mg | INTRAVENOUS | Status: DC | PRN

## 2020-09-30 ENCOUNTER — Telehealth: Payer: Self-pay

## 2020-09-30 NOTE — Telephone Encounter (Signed)
Patient notified of test results. She states her HR still is low(58-60) and want to know how long will she anticipate it returning to her normal range (80's). She also is back on Spironolactone -HCTZ. Message sent to Dr. Agustin Cree.

## 2020-09-30 NOTE — Telephone Encounter (Signed)
-----   Message from Park Liter, MD sent at 09/30/2020 10:25 AM EDT ----- No significant coronary artery disease identified.  Overall looks good calcium score 0

## 2020-10-01 ENCOUNTER — Telehealth: Payer: Self-pay | Admitting: Cardiology

## 2020-10-01 ENCOUNTER — Telehealth (HOSPITAL_COMMUNITY): Payer: Self-pay | Admitting: Emergency Medicine

## 2020-10-01 NOTE — Telephone Encounter (Signed)
Pt calling reporting residual fatigue, low BPs and low HRs since having a coronary CTA on Wednesday at med center [facility].   Pt received PO metoprolol and ivabradine prior to the scan as well as IV doses of metoprolol and diltiazem on the scanner.   Pt states her SBP have been less than 100 and shes taken handfuls of salt to increase the BP and taking half doses of daily BP/diuretic pills.   Marchia Bond RN Navigator Cardiac Imaging [facility] [phone] Office  [phone] Cell

## 2020-10-01 NOTE — Telephone Encounter (Signed)
Patient returning call for CT results. 

## 2020-10-01 NOTE — Telephone Encounter (Signed)
Message addressed.

## 2020-10-01 NOTE — Telephone Encounter (Signed)
Patient was returning call 

## 2020-10-04 NOTE — Telephone Encounter (Signed)
Called patient. She is feeling a little dizzy still at times. Her systolic blood pressure today was 117 and heart rate 79. She is taking spironolactone -hctz 25mg /25 mg 1 tablet daily. She reports she has been eat pur salt to help get blood pressure back up. She reports she is not doing this anymore but was. Will consult with Dr. Agustin Cree.

## 2020-10-04 NOTE — Telephone Encounter (Signed)
Left message for patient to return call.

## 2020-10-05 NOTE — Telephone Encounter (Signed)
Called patient informed her of Dr. Wendy Poet note. She will half her spironolactone-hctz for now and let us know if this does not help. No further questions.

## 2020-10-17 HISTORY — PX: UPPER GI ENDOSCOPY: SHX6162

## 2020-10-18 ENCOUNTER — Other Ambulatory Visit: Payer: Self-pay

## 2020-10-19 DIAGNOSIS — M5137 Other intervertebral disc degeneration, lumbosacral region: Secondary | ICD-10-CM | POA: Diagnosis not present

## 2020-10-19 DIAGNOSIS — M5030 Other cervical disc degeneration, unspecified cervical region: Secondary | ICD-10-CM | POA: Diagnosis not present

## 2020-10-19 DIAGNOSIS — Z79899 Other long term (current) drug therapy: Secondary | ICD-10-CM | POA: Diagnosis not present

## 2020-10-19 DIAGNOSIS — M4722 Other spondylosis with radiculopathy, cervical region: Secondary | ICD-10-CM | POA: Diagnosis not present

## 2020-10-19 DIAGNOSIS — G894 Chronic pain syndrome: Secondary | ICD-10-CM | POA: Diagnosis not present

## 2020-10-20 DIAGNOSIS — R079 Chest pain, unspecified: Secondary | ICD-10-CM | POA: Diagnosis not present

## 2020-10-20 DIAGNOSIS — R1310 Dysphagia, unspecified: Secondary | ICD-10-CM | POA: Diagnosis not present

## 2020-10-20 DIAGNOSIS — R1013 Epigastric pain: Secondary | ICD-10-CM | POA: Diagnosis not present

## 2020-10-21 DIAGNOSIS — J410 Simple chronic bronchitis: Secondary | ICD-10-CM | POA: Diagnosis not present

## 2020-10-21 DIAGNOSIS — Z79899 Other long term (current) drug therapy: Secondary | ICD-10-CM | POA: Diagnosis not present

## 2020-10-21 DIAGNOSIS — F17210 Nicotine dependence, cigarettes, uncomplicated: Secondary | ICD-10-CM | POA: Diagnosis not present

## 2020-10-21 DIAGNOSIS — M62830 Muscle spasm of back: Secondary | ICD-10-CM | POA: Diagnosis not present

## 2020-10-21 DIAGNOSIS — I700 Atherosclerosis of aorta: Secondary | ICD-10-CM | POA: Diagnosis not present

## 2020-10-21 DIAGNOSIS — E782 Mixed hyperlipidemia: Secondary | ICD-10-CM | POA: Diagnosis not present

## 2020-10-21 DIAGNOSIS — G40909 Epilepsy, unspecified, not intractable, without status epilepticus: Secondary | ICD-10-CM | POA: Diagnosis not present

## 2020-10-21 DIAGNOSIS — G720 Drug-induced myopathy: Secondary | ICD-10-CM | POA: Diagnosis not present

## 2020-10-21 DIAGNOSIS — K2960 Other gastritis without bleeding: Secondary | ICD-10-CM | POA: Diagnosis not present

## 2020-10-22 ENCOUNTER — Ambulatory Visit (INDEPENDENT_AMBULATORY_CARE_PROVIDER_SITE_OTHER): Payer: Medicare HMO

## 2020-10-22 ENCOUNTER — Other Ambulatory Visit: Payer: Self-pay

## 2020-10-22 ENCOUNTER — Encounter: Payer: Self-pay | Admitting: Cardiology

## 2020-10-22 ENCOUNTER — Ambulatory Visit: Payer: Medicare HMO | Admitting: Cardiology

## 2020-10-22 VITALS — BP 128/80 | HR 109 | Ht 61.0 in | Wt 109.0 lb

## 2020-10-22 DIAGNOSIS — R002 Palpitations: Secondary | ICD-10-CM | POA: Diagnosis not present

## 2020-10-22 DIAGNOSIS — E782 Mixed hyperlipidemia: Secondary | ICD-10-CM | POA: Diagnosis not present

## 2020-10-22 DIAGNOSIS — I10 Essential (primary) hypertension: Secondary | ICD-10-CM

## 2020-10-22 DIAGNOSIS — R000 Tachycardia, unspecified: Secondary | ICD-10-CM

## 2020-10-22 DIAGNOSIS — IMO0001 Reserved for inherently not codable concepts without codable children: Secondary | ICD-10-CM

## 2020-10-22 DIAGNOSIS — F17200 Nicotine dependence, unspecified, uncomplicated: Secondary | ICD-10-CM

## 2020-10-22 NOTE — Patient Instructions (Signed)
Medication Instructions:  Your physician recommends that you continue on your current medications as directed. Please refer to the Current Medication list given to you today.  *If you need a refill on your cardiac medications before your next appointment, please call your pharmacy*   Lab Work: Your physician recommends that you return for lab work today: tsh   If you have labs (blood work) drawn today and your tests are completely normal, you will receive your results only by: Marland Kitchen MyChart Message (if you have MyChart) OR . A paper copy in the mail If you have any lab test that is abnormal or we need to change your treatment, we will call you to review the results.   Testing/Procedures: A zio monitor was ordered today. It will remain on for 7 days. You will then return monitor and event diary in provided box. It takes 1-2 weeks for report to be downloaded and returned to [name]. We will call you with the results. If monitor falls off or has orange flashing light, please call Zio for further instructions.      Follow-Up: At [facility], you and your health needs are our priority.  As part of our continuing mission to provide you with exceptional heart care, we have created designated Provider Care Teams.  These Care Teams include your primary Cardiologist (physician) and Advanced Practice Providers (APPs -  Physician Assistants and Nurse Practitioners) who all work together to provide you with the care you need, when you need it.  We recommend signing up for the patient portal called "MyChart".  Sign up information is provided on this After Visit Summary.  MyChart is used to connect with patients for Virtual Visits (Telemedicine).  Patients are able to view lab/test results, encounter notes, upcoming appointments, etc.  Non-urgent messages can be sent to your provider as well.   To learn more about what you can do with MyChart, go to [email or web address].    Your next appointment:   6  month(s)  The format for your next appointment:   In Person  Provider:   Jenne Campus, MD   Other Instructions

## 2020-10-22 NOTE — Progress Notes (Signed)
Cardiology Office Note:    Date:  10/22/2020   ID:  Crystal Lyons, DOB 05/11/1966, MRN [id number]  PCP:  Street, Sharon Mt, MD  Cardiologist:  Jenne Campus, MD    Referring MD: Street, Sharon Mt, *   Chief Complaint  Patient presents with  . High HR    History of Present Illness:    Crystal Lyons is a 55 y.o. female with past medical history significant for essential hypertension, heavy chronic ongoing smoking, dyslipidemia, atypical chest pain, Barrett's esophagus.  Recently she had coronary CT angio which showed no evidence of significant coronary artery disease.  She still complain of not feeling well she is losing some weight she still continues to smoke quite heavily.  Denies have any chest pain tightness squeezing pressure mid chest.  She said her experience during coronary CT angio was horrible she end up having some blisters on her back.  Past Medical History:  Diagnosis Date  . Anxiety   . Anxiety disorder 08/19/2013  . Atypical chest pain   . Barrett esophagus   . Brachial neuritis 11/23/2012  . Carpal tunnel syndrome    right and left wrists  . Cervical post-laminectomy syndrome 08/19/2013  . Chronic headache disorder 05/08/2016  . Chronic neck pain 03/15/2015  . DDD (degenerative disc disease), lumbosacral 08/19/2013  . Disorder of bone and cartilage 04/21/2013  . Dyslipidemia 01/21/2015  . Dysrhythmia   . Encounter for therapeutic drug monitoring 05/01/2019  . Epilepsy ([place]) 02/24/2014  . Essential hypertension 11/20/2017  . GERD (gastroesophageal reflux disease)   . Headache 03/09/2014  . Headache(784.0) 03/09/2014  . Heart murmur   . Hiatal hernia   . Hypercholesterolemia 11/23/2012  . Hyperlipidemia   . Hypertension   . Low back pain 11/23/2012  . Lumbar radicular pain 04/21/2013  . Myalgia and myositis 08/19/2013  . Osteoarthritis of spine with radiculopathy, 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/15/2015  . Osteopenia   . Seizure disorder ([place]) 08/19/2013  . Seizures ([place])  09/27/2012  . Shingles   . Sinus tachycardia 08/16/2018  . Sleep apnea   . Sleep disorder 09/27/2012  . Smoking 11/20/2017  . Vitamin D deficiency 02/24/2014    Past Surgical History:  Procedure Laterality Date  . BRAIN SURGERY  01-06-85  . BRAIN SURGERY Right 1986   "right temporal lobe"  . BREAST BIOPSY    . CHOLECYSTECTOMY    . CYST REMOVAL TRUNK    . ears    . EYE SURGERY     cyst removed  . GALLBLADDER SURGERY N/A 2009  . NECK SURGERY    . SHOULDER SURGERY Bilateral 2009, 2010  . VULVECTOMY N/A 07/04/2018   Procedure: WIDE EXCISION VULVECTOMY;  Surgeon: Vanessa Kick, MD;  Location: [facility];  Service: Gynecology;  Laterality: N/A;    Current Medications: Current Meds  Medication Sig  . albuterol (PROVENTIL HFA;VENTOLIN HFA) 108 (90 Base) MCG/ACT inhaler Inhale 2 puffs into the lungs every 6 (six) hours as needed for wheezing or shortness of breath.  . ALPRAZolam (XANAX) 0.25 MG tablet Take 0.5 mg by mouth 2 (two) times daily.  . clobetasol ointment (TEMOVATE) 6.16 % Apply 1 application topically 2 (two) times daily.  Marland Kitchen denosumab (PROLIA) 60 MG/ML SOSY injection Inject 60 mg into the skin every 6 (six) months.  . dicyclomine (BENTYL) 10 MG capsule Take 10 mg by mouth every 6 (six) hours as needed for spasms. For abdominal pain  . famotidine (PEPCID) 40 MG tablet Take 40 mg by mouth daily.  Marland Kitchen  Fexofenadine-Pseudoephedrine (ALLEGRA-D 12 HOUR PO) Take 1 tablet by mouth every other day.  Marland Kitchen HYDROcodone-acetaminophen (NORCO/VICODIN) 5-325 MG per tablet Take 1 tablet by mouth as needed for moderate pain or severe pain (Up tp 4 times a day).  . lansoprazole (PREVACID) 30 MG capsule Take 30 mg by mouth daily.   Marland Kitchen omeprazole (PRILOSEC) 20 MG capsule Take 20 mg by mouth as needed (indigestion).  Marland Kitchen PREDNISONE PO Take 20 mg by mouth in the morning and at bedtime. Take w/ food  . spironolactone-hydrochlorothiazide (ALDACTAZIDE) 25-25 MG tablet Take 1 tablet by mouth daily.  Marland Kitchen tiZANidine  (ZANAFLEX) 4 MG capsule Take 4 mg by mouth 3 (three) times daily.  Marland Kitchen VIVELLE-DOT 0.05 MG/24HR Place 1 patch onto the skin 2 (two) times a week.      Allergies:   Adenosine, Azithromycin, Aleve [naproxen sodium], Erythromycin base, Feldene [piroxicam], Keflex [cephalexin], Mobic [meloxicam], Neurontin [gabapentin], Other, Penicillins, Tape, and Vioxx [rofecoxib]   Social History   Socioeconomic History  . Marital status: Single    Spouse name: Not on file  . Number of children: 0  . Years of education: 8  . Highest education level: Not on file  Occupational History  . Occupation: Disabled  Tobacco Use  . Smoking status: Current Every Day Smoker    Packs/day: 1.50    Types: Cigarettes  . Smokeless tobacco: Former Systems developer    Types: Secondary school teacher  . Vaping Use: Former  Substance and Sexual Activity  . Alcohol use: No  . Drug use: No  . Sexual activity: Not on file  Other Topics Concern  . Not on file  Social History Narrative   Patient lives at home mother    Patient has no children.    Patient is single.    Patient has a 8th grade education.    Patient is disabled         Social Determinants of Radio broadcast assistant Strain: Not on file  Food Insecurity: Not on file  Transportation Needs: Not on file  Physical Activity: Not on file  Stress: Not on file  Social Connections: Not on file     Family History: The patient's family history includes Cancer in her father and mother; Cirrhosis in her sister; Diabetes in her father; Heart failure in her father; Hypertension in her father and mother; Rheum arthritis in her mother; Stroke in her mother; Thyroid disease in her mother. ROS:   Please see the history of present illness.    All 14 point review of systems negative except as described per history of present illness  EKGs/Labs/Other Studies Reviewed:      Recent Labs: 07/15/2020: BUN 11; Potassium 4.7; Sodium 140 09/29/2020: Creatinine, Ser 1.00  Recent Lipid  Panel    Component Value Date/Time   CHOL 215 (H) 07/15/2020 1200   TRIG 130 07/15/2020 1200   HDL 45 07/15/2020 1200   CHOLHDL 4.8 (H) 07/15/2020 1200   LDLCALC 147 (H) 07/15/2020 1200    Physical Exam:    VS:  BP 128/80 (BP Location: Left Arm, Patient Position: Sitting)   Pulse (!) 109   Ht 5\' 1"  (1.549 m)   Wt 109 lb (49.4 kg)   SpO2 98%   BMI 20.60 kg/m     Wt Readings from Last 3 Encounters:  10/22/20 109 lb (49.4 kg)  09/08/20 115 lb (52.2 kg)  07/21/20 114 lb (51.7 kg)     GEN:  Well nourished, well developed in no  acute distress HEENT: Normal NECK: No JVD; No carotid bruits LYMPHATICS: No lymphadenopathy CARDIAC: RRR, no murmurs, no rubs, no gallops RESPIRATORY:  Clear to auscultation without rales, wheezing or rhonchi  ABDOMEN: Soft, non-tender, non-distended MUSCULOSKELETAL:  No edema; No deformity  SKIN: Warm and dry LOWER EXTREMITIES: no swelling NEUROLOGIC:  Alert and oriented x 3 PSYCHIATRIC:  Normal affect   ASSESSMENT:    1. Essential hypertension   2. Sinus tachycardia   3. Smoking   4. Mixed hyperlipidemia    PLAN:    In order of problems listed above:  1. Essential hypertension, blood pressure seems to be well controlled today we will continue present management. 2. Sinus tachycardia EKG will be done today, I will ask her to wear Zio patch for few days to see what the heart rate variability is. 3. Smoking still ongoing obviously problematic she understands the problem and she told me straight that she will not be able to quit. 4. Mixed dyslipidemia, she does have difficulty tolerating cholesterol medication, the only thing she is on right now is Zetia.  Last fasting lipid profile show LDL 147 HDL 45.  We did try different cholesterol medication with no Success.  We will continue discussion about potentially using some additional medications.   Medication Adjustments/Labs and Tests Ordered: Current medicines are reviewed at length with the  patient today.  Concerns regarding medicines are outlined above.  No orders of the defined types were placed in this encounter.  Medication changes: No orders of the defined types were placed in this encounter.   Signed, Park Liter, MD, [facility] 10/22/2020 2:50 PM    [facility]

## 2020-10-22 NOTE — Addendum Note (Signed)
Addended by: Senaida Ores on: 10/22/2020 03:07 PM   Modules accepted: Orders

## 2020-10-23 LAB — TSH: TSH: 0.657 u[IU]/mL (ref 0.450–4.500)

## 2020-10-29 DIAGNOSIS — K222 Esophageal obstruction: Secondary | ICD-10-CM | POA: Diagnosis not present

## 2020-10-29 DIAGNOSIS — R000 Tachycardia, unspecified: Secondary | ICD-10-CM

## 2020-10-29 DIAGNOSIS — R1310 Dysphagia, unspecified: Secondary | ICD-10-CM | POA: Diagnosis not present

## 2020-10-29 DIAGNOSIS — F17200 Nicotine dependence, unspecified, uncomplicated: Secondary | ICD-10-CM | POA: Diagnosis not present

## 2020-10-29 DIAGNOSIS — I10 Essential (primary) hypertension: Secondary | ICD-10-CM

## 2020-10-29 DIAGNOSIS — K449 Diaphragmatic hernia without obstruction or gangrene: Secondary | ICD-10-CM | POA: Diagnosis not present

## 2020-10-29 DIAGNOSIS — E782 Mixed hyperlipidemia: Secondary | ICD-10-CM | POA: Diagnosis not present

## 2020-10-29 DIAGNOSIS — R1013 Epigastric pain: Secondary | ICD-10-CM | POA: Diagnosis not present

## 2020-10-29 DIAGNOSIS — K259 Gastric ulcer, unspecified as acute or chronic, without hemorrhage or perforation: Secondary | ICD-10-CM | POA: Diagnosis not present

## 2020-10-29 DIAGNOSIS — B3781 Candidal esophagitis: Secondary | ICD-10-CM | POA: Diagnosis not present

## 2020-10-29 DIAGNOSIS — K2270 Barrett's esophagus without dysplasia: Secondary | ICD-10-CM | POA: Diagnosis not present

## 2020-10-29 DIAGNOSIS — F17210 Nicotine dependence, cigarettes, uncomplicated: Secondary | ICD-10-CM | POA: Diagnosis not present

## 2020-10-29 DIAGNOSIS — R109 Unspecified abdominal pain: Secondary | ICD-10-CM | POA: Diagnosis not present

## 2020-10-29 DIAGNOSIS — Z79899 Other long term (current) drug therapy: Secondary | ICD-10-CM | POA: Diagnosis not present

## 2020-10-29 DIAGNOSIS — K263 Acute duodenal ulcer without hemorrhage or perforation: Secondary | ICD-10-CM | POA: Diagnosis not present

## 2020-11-03 DIAGNOSIS — R000 Tachycardia, unspecified: Secondary | ICD-10-CM | POA: Diagnosis not present

## 2020-11-03 DIAGNOSIS — F17200 Nicotine dependence, unspecified, uncomplicated: Secondary | ICD-10-CM | POA: Diagnosis not present

## 2020-11-03 DIAGNOSIS — I10 Essential (primary) hypertension: Secondary | ICD-10-CM | POA: Diagnosis not present

## 2020-11-18 ENCOUNTER — Telehealth: Payer: Self-pay

## 2020-11-18 NOTE — Telephone Encounter (Signed)
-----   Message from Park Liter, MD sent at 11/17/2020  9:27 PM EDT ----- No significant abnormality noted on the monitor

## 2020-11-18 NOTE — Telephone Encounter (Signed)
Patient notified of results.

## 2020-11-22 DIAGNOSIS — Z7189 Other specified counseling: Secondary | ICD-10-CM | POA: Diagnosis not present

## 2020-11-22 DIAGNOSIS — M810 Age-related osteoporosis without current pathological fracture: Secondary | ICD-10-CM | POA: Diagnosis not present

## 2020-12-08 ENCOUNTER — Other Ambulatory Visit: Payer: Self-pay | Admitting: Adult Health

## 2020-12-09 NOTE — Telephone Encounter (Signed)
approved

## 2020-12-23 ENCOUNTER — Telehealth: Payer: Self-pay

## 2020-12-23 NOTE — Telephone Encounter (Signed)
   [facility] Pre-operative Risk Assessment    Patient Name: Crystal Lyons  DOB: Mar 16, 1966 MRN: [id number]  HEARTCARE STAFF:  - IMPORTANT!!!!!! Under Visit Info/Reason for Call, type in Other and utilize the format Clearance MM/DD/YY or Clearance TBD. Do not use dashes or single digits. - Please review there is not already an duplicate clearance open for this procedure. - If request is for dental extraction, please clarify the # of teeth to be extracted. - If the patient is currently at the dentist's office, call Pre-Op Callback Staff (MA/nurse) to input urgent request.  - If the patient is not currently in the dentist office, please route to the Pre-Op pool.  Request for surgical clearance:  What type of surgery is being performed? Dental extraction number of teeth unspecified  When is this surgery scheduled? TBD   What type of clearance is required (medical clearance vs. Pharmacy clearance to hold med vs. Both)? Both  Are there any medications that need to be held prior to surgery and how long?None specified    Practice name and name of physician performing surgery? [facility]. Dr. Buelah Manis   What is the office phone number? [phone]   7.   What is the office fax number? [phone]  8.   Anesthesia type (None, local, MAC, general) ? Local   Gita Kudo 12/23/2020, 1:43 PM  _________________________________________________________________   (provider comments below)

## 2020-12-23 NOTE — Telephone Encounter (Signed)
Will route back to call back staff to get additional information, including the number of teeth extracted and date of surgery if now available.

## 2020-12-23 NOTE — Telephone Encounter (Signed)
Tried to call DDS office x 2 for clarification, though no answer. Will need to know how many teeth are to be possibly extracted.

## 2020-12-24 NOTE — Telephone Encounter (Addendum)
Attempt at reaching DDS for clarification of # teeth. No answer x1. No VM available.

## 2020-12-27 ENCOUNTER — Telehealth: Payer: Self-pay | Admitting: Cardiology

## 2020-12-27 NOTE — Telephone Encounter (Signed)
Encounter not needed

## 2020-12-27 NOTE — Telephone Encounter (Signed)
   Patient Name: Crystal Lyons  DOB: July 29, 1965 MRN: [id number]  Primary Cardiologist: Jenne Campus, MD  Chart reviewed as part of pre-operative protocol coverage.   Simple dental extractions of 1 or 2 teeth are considered low risk procedures per guidelines and generally do not require any specific cardiac clearance. It is also generally accepted that for simple extractions and dental cleanings, there is no need to interrupt blood thinner therapy.  We have attempted to reach the requesting office three times to clarify number of teeth extracted. The patient states she will have one or two teeth extracted. If additional dental work needs to be completed, please re-submit preop clearance form with complete information. Recent CT coronary confirmed no coronary calcification. She has previously been told to take SBE PPX ABX due to heart murmur. Echo in 2020 with no significant valvular disease, no history of valve repair or replacement.   SBE prophylaxis is not required for the patient from a cardiac standpoint.  I will route this recommendation to the requesting party via Epic fax function and remove from pre-op pool.  Please call with questions.  Tami Lin Ceceilia Cephus, PA 12/27/2020, 8:54 AM

## 2020-12-27 NOTE — Telephone Encounter (Signed)
I s/w the pt and she is aware clearance has been sent and she does not need SBE for this dental work per pre op provider. Pt thanked me for the call and the help. I assures the pt that I have faxed over the clearance to Dr. Dorian Heckle office.

## 2020-12-27 NOTE — Telephone Encounter (Signed)
New message:      Patient returning a call back.

## 2020-12-27 NOTE — Telephone Encounter (Signed)
Notes have been faxed to Dr. Kerry Kass office

## 2021-01-10 DIAGNOSIS — K2270 Barrett's esophagus without dysplasia: Secondary | ICD-10-CM | POA: Diagnosis not present

## 2021-01-10 DIAGNOSIS — R079 Chest pain, unspecified: Secondary | ICD-10-CM | POA: Diagnosis not present

## 2021-01-10 DIAGNOSIS — K5900 Constipation, unspecified: Secondary | ICD-10-CM | POA: Diagnosis not present

## 2021-01-10 DIAGNOSIS — K219 Gastro-esophageal reflux disease without esophagitis: Secondary | ICD-10-CM | POA: Diagnosis not present

## 2021-01-10 DIAGNOSIS — R1310 Dysphagia, unspecified: Secondary | ICD-10-CM | POA: Diagnosis not present

## 2021-01-10 DIAGNOSIS — K649 Unspecified hemorrhoids: Secondary | ICD-10-CM | POA: Diagnosis not present

## 2021-01-10 DIAGNOSIS — R1030 Lower abdominal pain, unspecified: Secondary | ICD-10-CM | POA: Diagnosis not present

## 2021-01-13 DIAGNOSIS — Z8719 Personal history of other diseases of the digestive system: Secondary | ICD-10-CM | POA: Diagnosis not present

## 2021-01-13 DIAGNOSIS — Z0000 Encounter for general adult medical examination without abnormal findings: Secondary | ICD-10-CM | POA: Diagnosis not present

## 2021-01-13 DIAGNOSIS — M7552 Bursitis of left shoulder: Secondary | ICD-10-CM | POA: Diagnosis not present

## 2021-01-13 DIAGNOSIS — M75102 Unspecified rotator cuff tear or rupture of left shoulder, not specified as traumatic: Secondary | ICD-10-CM | POA: Diagnosis not present

## 2021-01-13 DIAGNOSIS — D171 Benign lipomatous neoplasm of skin and subcutaneous tissue of trunk: Secondary | ICD-10-CM | POA: Diagnosis not present

## 2021-01-13 DIAGNOSIS — Z9889 Other specified postprocedural states: Secondary | ICD-10-CM | POA: Diagnosis not present

## 2021-01-13 DIAGNOSIS — G720 Drug-induced myopathy: Secondary | ICD-10-CM | POA: Diagnosis not present

## 2021-01-13 DIAGNOSIS — E782 Mixed hyperlipidemia: Secondary | ICD-10-CM | POA: Diagnosis not present

## 2021-01-13 DIAGNOSIS — M12812 Other specific arthropathies, not elsewhere classified, left shoulder: Secondary | ICD-10-CM | POA: Diagnosis not present

## 2021-01-19 DIAGNOSIS — G894 Chronic pain syndrome: Secondary | ICD-10-CM | POA: Diagnosis not present

## 2021-01-19 DIAGNOSIS — M5137 Other intervertebral disc degeneration, lumbosacral region: Secondary | ICD-10-CM | POA: Diagnosis not present

## 2021-01-19 DIAGNOSIS — M5030 Other cervical disc degeneration, unspecified cervical region: Secondary | ICD-10-CM | POA: Diagnosis not present

## 2021-01-25 DIAGNOSIS — R1310 Dysphagia, unspecified: Secondary | ICD-10-CM | POA: Diagnosis not present

## 2021-01-26 DIAGNOSIS — I10 Essential (primary) hypertension: Secondary | ICD-10-CM | POA: Diagnosis not present

## 2021-01-26 DIAGNOSIS — K219 Gastro-esophageal reflux disease without esophagitis: Secondary | ICD-10-CM | POA: Diagnosis not present

## 2021-01-26 DIAGNOSIS — R1033 Periumbilical pain: Secondary | ICD-10-CM | POA: Diagnosis not present

## 2021-01-26 DIAGNOSIS — K5904 Chronic idiopathic constipation: Secondary | ICD-10-CM | POA: Diagnosis not present

## 2021-01-27 DIAGNOSIS — K2960 Other gastritis without bleeding: Secondary | ICD-10-CM | POA: Diagnosis not present

## 2021-01-27 DIAGNOSIS — B029 Zoster without complications: Secondary | ICD-10-CM | POA: Diagnosis not present

## 2021-01-27 DIAGNOSIS — B3781 Candidal esophagitis: Secondary | ICD-10-CM | POA: Diagnosis not present

## 2021-01-27 DIAGNOSIS — Z681 Body mass index (BMI) 19 or less, adult: Secondary | ICD-10-CM | POA: Diagnosis not present

## 2021-01-27 DIAGNOSIS — B370 Candidal stomatitis: Secondary | ICD-10-CM | POA: Diagnosis not present

## 2021-01-27 DIAGNOSIS — F17210 Nicotine dependence, cigarettes, uncomplicated: Secondary | ICD-10-CM | POA: Diagnosis not present

## 2021-02-03 ENCOUNTER — Other Ambulatory Visit: Payer: Self-pay | Admitting: Adult Health

## 2021-02-09 ENCOUNTER — Telehealth: Payer: Self-pay | Admitting: *Deleted

## 2021-02-09 NOTE — Telephone Encounter (Signed)
I will certainly discuss this with the patient when she comes for her next revisit.  She has been on a stable dose of alprazolam for some time, I have no problem with continuing this medication.  Whomever takes over her care in the future may decide to continue the medication or not, but that would be their decision.  My decision will be to continue the medication.

## 2021-02-09 NOTE — Telephone Encounter (Signed)
Per Edman Circle NP, called patient and informed her that with Dr. Jannifer Franklin retiring we will no longer refill chronic benzodiazepines.  Advised the patient that if she is using this as a seizure medication then we will switch her over to another seizure medication such as Lamictal.  If she is using Xanax for anxiety she can discuss with her PCP or she can make referral to psychiatry. She stated her PCP said he'd take over Rx if needed, that she must be on it to keep her stress level down. Stress would cause her to have seizures. She has been on Xanax since  1993. I advised her will let [name] know and they can discuss at her FU in Oct. Patient verbalized understanding, appreciation.

## 2021-03-07 ENCOUNTER — Other Ambulatory Visit: Payer: Self-pay | Admitting: Internal Medicine

## 2021-03-29 DIAGNOSIS — Z23 Encounter for immunization: Secondary | ICD-10-CM | POA: Diagnosis not present

## 2021-03-31 ENCOUNTER — Encounter: Payer: Self-pay | Admitting: Neurology

## 2021-03-31 ENCOUNTER — Other Ambulatory Visit: Payer: Self-pay

## 2021-03-31 ENCOUNTER — Ambulatory Visit: Payer: Medicare HMO | Admitting: Neurology

## 2021-03-31 ENCOUNTER — Telehealth: Payer: Self-pay | Admitting: Neurology

## 2021-03-31 VITALS — BP 102/69 | HR 84 | Ht 61.0 in | Wt 103.2 lb

## 2021-03-31 DIAGNOSIS — G40909 Epilepsy, unspecified, not intractable, without status epilepticus: Secondary | ICD-10-CM | POA: Diagnosis not present

## 2021-03-31 MED ORDER — CLONAZEPAM 0.5 MG PO TABS: 0.5000 mg | ORAL_TABLET | Freq: Two times a day (BID) | ORAL | 3 refills | Status: DC

## 2021-03-31 NOTE — Telephone Encounter (Signed)
Called pharmacy, spoke with Colletta Maryland, pharmacist who stated patient has been on xanax for a very long time. She is asking if she is to be on both clonazepam and Xanax. I read [name]' note from visit today. Colletta Maryland said the patient has 1 one refill on Xanax; she will discontinue it now, verbalized understanding, appreciation.

## 2021-03-31 NOTE — Progress Notes (Signed)
Reason for visit: History of seizures  Crystal Lyons is an 55 y.o. female  History of present illness:  Crystal Lyons is a 55 year old right-handed white female with a history of seizures.  The patient has had epilepsy surgery resecting a portion of the right anterior temporal lobe.  She has not had any overt seizures since that time, she has come off all of her anticonvulsant medications.  The patient has daily events of a tight or uncomfortable sensations in the stomach that she relates to her prior seizure aura.  She takes alprazolam for this.  She notes that stress worsens her symptoms.  The patient believes that the alprazolam does help her.  She has had no new issues since last seen.  She returns for further evaluation.  Past Medical History:  Diagnosis Date   Anxiety    Anxiety disorder 08/19/2013   Atypical chest pain    Barrett esophagus    Brachial neuritis 11/23/2012   Carpal tunnel syndrome    right and left wrists   Cervical post-laminectomy syndrome 08/19/2013   Chronic headache disorder 05/08/2016   Chronic neck pain 03/15/2015   DDD (degenerative disc disease), lumbosacral 08/19/2013   Disorder of bone and cartilage 04/21/2013   Dyslipidemia 01/21/2015   Dysrhythmia    Encounter for therapeutic drug monitoring 05/01/2019   Epilepsy ([place]) 02/24/2014   Essential hypertension 11/20/2017   GERD (gastroesophageal reflux disease)    Headache 03/09/2014   Headache(784.0) 03/09/2014   Heart murmur    Hiatal hernia    Hypercholesterolemia 11/23/2012   Hyperlipidemia    Hypertension    Low back pain 11/23/2012   Lumbar radicular pain 04/21/2013   Myalgia and myositis 08/19/2013   Osteoarthritis of spine with radiculopathy, 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/15/2015   Osteopenia    Seizure disorder ([place]) 08/19/2013   Seizures ([place]) 09/27/2012   Shingles    Sinus tachycardia 08/16/2018   Sleep apnea    Sleep disorder 09/27/2012   Smoking 11/20/2017   Vitamin D deficiency 02/24/2014    Past Surgical History:   Procedure Laterality Date   BRAIN SURGERY  01/06/1985   BRAIN SURGERY Right 1986   "right temporal lobe"   BREAST BIOPSY     CHOLECYSTECTOMY     CYST REMOVAL TRUNK     ears     EYE SURGERY     cyst removed   GALLBLADDER SURGERY N/A 2009   NECK SURGERY     SHOULDER SURGERY Bilateral 2009, 2010   UPPER GI ENDOSCOPY  10/2020   VULVECTOMY N/A 07/04/2018   Procedure: WIDE EXCISION VULVECTOMY;  Surgeon: Vanessa Kick, MD;  Location: [facility];  Service: Gynecology;  Laterality: N/A;    Family History  Problem Relation Age of Onset   Hypertension Father    Cancer Father    Diabetes Father    Heart failure Father    Cancer Mother    Thyroid disease Mother    Rheum arthritis Mother    Stroke Mother    Hypertension Mother    Cirrhosis Sister     Social history:  reports that she has been smoking cigarettes. She has been smoking an average of 1.5 packs per day. She has quit using smokeless tobacco.  Her smokeless tobacco use included chew. She reports that she does not drink alcohol and does not use drugs.    Allergies  Allergen Reactions   Adenosine Anaphylaxis    Heart block   Azithromycin Swelling   Aleve [Naproxen Sodium]  Erythromycin Base Hives and Swelling   Feldene [Piroxicam]    Keflex [Cephalexin]    Mobic [Meloxicam]    Neurontin [Gabapentin]    Other     Any anti-inflammatory   Penicillins Other (See Comments)    Welts DID THE REACTION INVOLVE: Swelling of the face/tongue/throat, SOB, or low BP? Unknown Sudden or severe rash/hives, skin peeling, or the inside of the mouth or nose? Unknown Did it require medical treatment? Unknown When did it last happen?   Childhood allergy    If all above answers are "NO", may proceed with cephalosporin use.    Tape     Medical adhesive tape   Vioxx [Rofecoxib]     Medications:  Prior to Admission medications   Medication Sig Start Date End Date Taking? Authorizing Provider  albuterol (PROVENTIL HFA;VENTOLIN HFA) 108  (90 Base) MCG/ACT inhaler Inhale 2 puffs into the lungs every 6 (six) hours as needed for wheezing or shortness of breath.   Yes [provider]  ALPRAZolam (XANAX) 0.5 MG tablet TAKE 1 TABLET BY MOUTH 2 TIMES DAILY AS NEEDED FOR ANXIETY 02/09/21  Yes Ward Givens, NP  clobetasol ointment (TEMOVATE) 4.43 % Apply 1 application topically 2 (two) times daily.   Yes [provider]  dicyclomine (BENTYL) 10 MG capsule Take 10 mg by mouth every 6 (six) hours as needed for spasms. For abdominal pain   Yes [provider]  ezetimibe (ZETIA) 10 MG tablet TAKE ONE TABLET BY MOUTH DAILY 03/07/21  Yes Park Liter, MD  famotidine (PEPCID) 40 MG tablet Take 40 mg by mouth daily.   Yes [provider]  Fexofenadine-Pseudoephedrine (ALLEGRA-D 12 HOUR PO) Take 1 tablet by mouth every other day.   Yes [provider]  HYDROcodone-acetaminophen (NORCO/VICODIN) 5-325 MG per tablet Take 1 tablet by mouth as needed for moderate pain or severe pain (Up tp 4 times a day). 09/19/12  Yes [provider]  lansoprazole (PREVACID) 30 MG capsule Take 30 mg by mouth daily.  09/24/19  Yes [provider]  omeprazole (PRILOSEC) 20 MG capsule Take 20 mg by mouth as needed (indigestion).   Yes [provider]  promethazine (PHENERGAN) 12.5 MG tablet Take 12.5 mg by mouth every 8 (eight) hours as needed for nausea or vomiting.  11/30/15 03/31/21 Yes [provider]  spironolactone-hydrochlorothiazide (ALDACTAZIDE) 25-25 MG tablet Take 1 tablet by mouth daily. 10/22/17  Yes [provider]  tiZANidine (ZANAFLEX) 4 MG capsule Take 4 mg by mouth 3 (three) times daily.   Yes [provider]  VIVELLE-DOT 0.05 MG/24HR Place 1 patch onto the skin 2 (two) times a week.  09/19/12  Yes [provider]  denosumab (PROLIA) 60 MG/ML SOSY injection Inject 60 mg into the skin every 6 (six) months. 09/22/19   [provider]  PREDNISONE PO  Take 20 mg by mouth in the morning and at bedtime. Take w/ food    [provider]    ROS:  Out of a complete 14 system review of symptoms, the patient complains only of the following symptoms, and all other reviewed systems are negative.  History of seizures Stomach discomfort Anxiety  Blood pressure 102/69, pulse 84, height 5\' 1"  (1.549 m), weight 103 lb 3.2 oz (46.8 kg).  Physical Exam  General: The patient is alert and cooperative at the time of the examination.  Skin: No significant peripheral edema is noted.   Neurologic Exam  Mental status: The patient is alert and oriented  x 3 at the time of the examination. The patient has apparent normal recent and remote memory, with an apparently normal attention span and concentration ability.   Cranial nerves: Facial symmetry is present. Speech is normal, no aphasia or dysarthria is noted. Extraocular movements are full. Visual fields are full.  Motor: The patient has good strength in all 4 extremities.  Sensory examination: Soft touch sensation is symmetric on the face, arms, and legs.  Coordination: The patient has good finger-nose-finger and heel-to-shin bilaterally.  Gait and station: The patient has a normal gait. Tandem gait is normal. Romberg is negative. No drift is seen.  Reflexes: Deep tendon reflexes are symmetric.   Assessment/Plan:  1.  History of seizures  The patient is on scheduled dose of alprazolam, but she may do better on clonazepam which is a longer acting medication.  This clearly seems to help some of her abdominal symptoms that she relates to her seizure aura.  She has not had any recurrent seizures however since the epilepsy surgery.  She seems to be susceptible to stress.  We will change her to clonazepam taking 0.5 mg twice a day.  She will stop the alprazolam.  She will follow-up here in 6 months.  In the future, she can be followed through Dr. April Manson.  Jill Alexanders MD 03/31/2021 2:41  PM  [facility] [address] [place], [place] [address]  Phone [phone] Fax [phone]

## 2021-03-31 NOTE — Telephone Encounter (Signed)
Pharmacy is asking for a call to discuss pt's clonazePAM (KLONOPIN) 0.5 MG tablet

## 2021-04-01 ENCOUNTER — Telehealth: Payer: Self-pay | Admitting: Neurology

## 2021-04-01 NOTE — Telephone Encounter (Signed)
Dear Mrs. Crystal Lyons, [name]' work week ended at 12 noon this Friday and he will be back in the office in 8 days. Please contact him during the office times. Our office is closed on Fridays.

## 2021-04-01 NOTE — Telephone Encounter (Signed)
I called the patient.  The patient indicates that she was on clonazepam in 2018, she claims that we prescribed the medication.  I see no record of this.  She indicates that she could not tolerate the drug because it made her too drowsy and zoned out.  I indicated that she should take the clonazepam, take 1/2 tablet in the evening for a week or so, if she tolerates this then go to half a tablet twice daily.  The clonazepam should work better for her because it is long-acting.  If she just cannot tolerate the clonazepam, we will go back to alprazolam.

## 2021-04-01 NOTE — Telephone Encounter (Signed)
Pt states [name] put her on clonazePAM (KLONOPIN) 0.5 MG tablet in 2018, it didn't work then and pt doesn't want to try it again, please call.

## 2021-04-06 DIAGNOSIS — K5900 Constipation, unspecified: Secondary | ICD-10-CM | POA: Diagnosis not present

## 2021-04-06 DIAGNOSIS — R1310 Dysphagia, unspecified: Secondary | ICD-10-CM | POA: Diagnosis not present

## 2021-04-06 DIAGNOSIS — K649 Unspecified hemorrhoids: Secondary | ICD-10-CM | POA: Diagnosis not present

## 2021-04-06 DIAGNOSIS — R1030 Lower abdominal pain, unspecified: Secondary | ICD-10-CM | POA: Diagnosis not present

## 2021-04-06 DIAGNOSIS — K219 Gastro-esophageal reflux disease without esophagitis: Secondary | ICD-10-CM | POA: Diagnosis not present

## 2021-04-06 DIAGNOSIS — K2270 Barrett's esophagus without dysplasia: Secondary | ICD-10-CM | POA: Diagnosis not present

## 2021-04-06 DIAGNOSIS — R634 Abnormal weight loss: Secondary | ICD-10-CM | POA: Diagnosis not present

## 2021-04-07 NOTE — Telephone Encounter (Signed)
Pt called, have paper showing have took Clonazepam 5 mg before. Faxing the proof have tried this medication. Would like a call from the nurse.

## 2021-04-07 NOTE — Telephone Encounter (Signed)
Addressed as well via mychart message from 04/01/21 "medication change"

## 2021-04-12 ENCOUNTER — Telehealth: Payer: Self-pay

## 2021-04-12 ENCOUNTER — Encounter: Payer: Self-pay | Admitting: *Deleted

## 2021-04-12 NOTE — Telephone Encounter (Signed)
PA for Clonazepam has been sent. Key: [id number] - PA Case ID: [id number] - Rx #: [id number].

## 2021-04-12 NOTE — Telephone Encounter (Addendum)
Clonazepam Approved, Coverage Starts on: 06/19/2020 12:00:00 AM, Coverage Ends on: 06/18/2022. Sent her my chart to inform of approval.

## 2021-04-13 DIAGNOSIS — M961 Postlaminectomy syndrome, not elsewhere classified: Secondary | ICD-10-CM | POA: Diagnosis not present

## 2021-04-13 DIAGNOSIS — G894 Chronic pain syndrome: Secondary | ICD-10-CM | POA: Diagnosis not present

## 2021-04-13 DIAGNOSIS — Z981 Arthrodesis status: Secondary | ICD-10-CM | POA: Diagnosis not present

## 2021-04-13 DIAGNOSIS — M4322 Fusion of spine, cervical region: Secondary | ICD-10-CM | POA: Diagnosis not present

## 2021-04-13 DIAGNOSIS — Z79899 Other long term (current) drug therapy: Secondary | ICD-10-CM | POA: Diagnosis not present

## 2021-04-13 DIAGNOSIS — M5137 Other intervertebral disc degeneration, lumbosacral region: Secondary | ICD-10-CM | POA: Diagnosis not present

## 2021-04-14 DIAGNOSIS — B370 Candidal stomatitis: Secondary | ICD-10-CM | POA: Diagnosis not present

## 2021-04-14 DIAGNOSIS — G40909 Epilepsy, unspecified, not intractable, without status epilepticus: Secondary | ICD-10-CM | POA: Diagnosis not present

## 2021-04-14 DIAGNOSIS — F17210 Nicotine dependence, cigarettes, uncomplicated: Secondary | ICD-10-CM | POA: Diagnosis not present

## 2021-04-14 DIAGNOSIS — B3781 Candidal esophagitis: Secondary | ICD-10-CM | POA: Diagnosis not present

## 2021-04-14 DIAGNOSIS — Z1331 Encounter for screening for depression: Secondary | ICD-10-CM | POA: Diagnosis not present

## 2021-04-18 DIAGNOSIS — H524 Presbyopia: Secondary | ICD-10-CM | POA: Diagnosis not present

## 2021-04-26 DIAGNOSIS — Z0100 Encounter for examination of eyes and vision without abnormal findings: Secondary | ICD-10-CM | POA: Diagnosis not present

## 2021-05-03 ENCOUNTER — Other Ambulatory Visit: Payer: Self-pay

## 2021-05-04 ENCOUNTER — Other Ambulatory Visit: Payer: Self-pay

## 2021-05-04 ENCOUNTER — Encounter: Payer: Self-pay | Admitting: Cardiology

## 2021-05-04 ENCOUNTER — Ambulatory Visit: Payer: Medicare HMO | Admitting: Cardiology

## 2021-05-04 VITALS — BP 106/64 | HR 107 | Ht 61.0 in | Wt 100.0 lb

## 2021-05-04 DIAGNOSIS — I10 Essential (primary) hypertension: Secondary | ICD-10-CM

## 2021-05-04 DIAGNOSIS — R0789 Other chest pain: Secondary | ICD-10-CM

## 2021-05-04 DIAGNOSIS — E785 Hyperlipidemia, unspecified: Secondary | ICD-10-CM | POA: Diagnosis not present

## 2021-05-04 DIAGNOSIS — E782 Mixed hyperlipidemia: Secondary | ICD-10-CM

## 2021-05-04 NOTE — Progress Notes (Signed)
Cardiology Office Note:    Date:  05/04/2021   ID:  Crystal Lyons, DOB 10/08/1965, MRN [id number]  PCP:  Street, Sharon Mt, MD  Cardiologist:  Jenne Campus, MD    Referring MD: Street, Sharon Mt, *   Chief Complaint  Patient presents with   Follow-up  Cardiac arrest I am doing well but I am losing weight  History of Present Illness:    Crystal Lyons is a 55 y.o. female  with past medical history significant for essential hypertension, heavy chronic ongoing smoking, dyslipidemia, atypical chest pain, Barrett's esophagus.  She comes today 2 months of follow-up overall cardiac wise doing well described the fact that she does have persistent tachycardia we did echocardiogram to assess ejection fraction which was normal, she did wear a monitor which showed only sinus tachycardia.  Denies have any chest pain tightness squeezing pressure burning chest.  Past Medical History:  Diagnosis Date   Anxiety    Anxiety disorder 08/19/2013   Atypical chest pain    Barrett esophagus    Brachial neuritis 11/23/2012   Carpal tunnel syndrome    right and left wrists   Cervical post-laminectomy syndrome 08/19/2013   Chronic headache disorder 05/08/2016   Chronic neck pain 03/15/2015   DDD (degenerative disc disease), lumbosacral 08/19/2013   Disorder of bone and cartilage 04/21/2013   Dyslipidemia 01/21/2015   Dysrhythmia    Encounter for therapeutic drug monitoring 05/01/2019   Epilepsy ([place]) 02/24/2014   Essential hypertension 11/20/2017   GERD (gastroesophageal reflux disease)    Headache 03/09/2014   Headache(784.0) 03/09/2014   Heart murmur    Hiatal hernia    Hypercholesterolemia 11/23/2012   Hyperlipidemia    Hypertension    Low back pain 11/23/2012   Lumbar radicular pain 04/21/2013   Myalgia and myositis 08/19/2013   Osteoarthritis of spine with radiculopathy, 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/15/2015   Osteopenia    Seizure disorder ([place]) 08/19/2013   Seizures ([place]) 09/27/2012   Shingles    Sinus  tachycardia 08/16/2018   Sleep apnea    Sleep disorder 09/27/2012   Smoking 11/20/2017   Vitamin D deficiency 02/24/2014    Past Surgical History:  Procedure Laterality Date   BRAIN SURGERY  01/06/1985   BRAIN SURGERY Right 1986   "right temporal lobe"   BREAST BIOPSY     CHOLECYSTECTOMY     CYST REMOVAL TRUNK     ears     EYE SURGERY     cyst removed   GALLBLADDER SURGERY N/A 2009   NECK SURGERY     SHOULDER SURGERY Bilateral 2009, 2010   UPPER GI ENDOSCOPY  10/2020   VULVECTOMY N/A 07/04/2018   Procedure: WIDE EXCISION VULVECTOMY;  Surgeon: Vanessa Kick, MD;  Location: [facility];  Service: Gynecology;  Laterality: N/A;    Current Medications: Current Meds  Medication Sig   albuterol (PROVENTIL HFA;VENTOLIN HFA) 108 (90 Base) MCG/ACT inhaler Inhale 2 puffs into the lungs every 6 (six) hours as needed for wheezing or shortness of breath.   ALPRAZolam (XANAX) 0.5 MG tablet Take 0.5 mg by mouth 3 (three) times daily as needed for anxiety.   clobetasol ointment (TEMOVATE) 0.26 % Apply 1 application topically 2 (two) times daily.   denosumab (PROLIA) 60 MG/ML SOSY injection Inject 60 mg into the skin every 6 (six) months.   dicyclomine (BENTYL) 10 MG capsule Take 10 mg by mouth every 6 (six) hours as needed for spasms. For abdominal pain   ezetimibe (ZETIA) 10 MG  tablet TAKE ONE TABLET BY MOUTH DAILY (Patient taking differently: Take 10 mg by mouth daily.)   famotidine (PEPCID) 40 MG tablet Take 40 mg by mouth daily.   Fexofenadine-Pseudoephedrine (ALLEGRA-D 12 HOUR PO) Take 1 tablet by mouth every other day.   HYDROcodone-acetaminophen (NORCO/VICODIN) 5-325 MG per tablet Take 1 tablet by mouth as needed for moderate pain or severe pain (Up tp 4 times a day).   lansoprazole (PREVACID) 30 MG capsule Take 30 mg by mouth daily.    omeprazole (PRILOSEC) 20 MG capsule Take 40 mg by mouth as needed (indigestion).   promethazine (PHENERGAN) 12.5 MG tablet Take 12.5 mg by mouth every 8 (eight)  hours as needed for nausea or vomiting.    spironolactone-hydrochlorothiazide (ALDACTAZIDE) 25-25 MG tablet Take 1 tablet by mouth daily.   tiZANidine (ZANAFLEX) 4 MG capsule Take 4 mg by mouth 3 (three) times daily.   VIVELLE-DOT 0.05 MG/24HR Place 1 patch onto the skin 2 (two) times a week.      Allergies:   Adenosine, Azithromycin, Aleve [naproxen sodium], Clonazepam, Erythromycin base, Feldene [piroxicam], Keflex [cephalexin], Mobic [meloxicam], Neurontin [gabapentin], Other, Penicillins, Tape, and Vioxx [rofecoxib]   Social History   Socioeconomic History   Marital status: Single    Spouse name: Not on file   Number of children: 0   Years of education: 8   Highest education level: Not on file  Occupational History   Occupation: Disabled  Tobacco Use   Smoking status: Every Day    Packs/day: 1.50    Types: Cigarettes   Smokeless tobacco: Former    Types: Nurse, children's Use: Former  Substance and Sexual Activity   Alcohol use: No   Drug use: No   Sexual activity: Not on file  Other Topics Concern   Not on file  Social History Narrative   Patient lives at home with  mother. Patient has no children. Patient is single. Patient has a 8th grade education. Patient is disabled   Social Determinants of Radio broadcast assistant Strain: Not on file  Food Insecurity: Not on file  Transportation Needs: Not on file  Physical Activity: Not on file  Stress: Not on file  Social Connections: Not on file     Family History: The patient's family history includes Cancer in her father and mother; Cirrhosis in her sister; Diabetes in her father; Heart failure in her father; Hypertension in her father and mother; Rheum arthritis in her mother; Stroke in her mother; Thyroid disease in her mother. ROS:   Please see the history of present illness.    All 14 point review of systems negative except as described per history of present illness  EKGs/Labs/Other Studies Reviewed:       Recent Labs: 07/15/2020: BUN 11; Potassium 4.7; Sodium 140 09/29/2020: Creatinine, Ser 1.00 10/22/2020: TSH 0.657  Recent Lipid Panel    Component Value Date/Time   CHOL 215 (H) 07/15/2020 1200   TRIG 130 07/15/2020 1200   HDL 45 07/15/2020 1200   CHOLHDL 4.8 (H) 07/15/2020 1200   LDLCALC 147 (H) 07/15/2020 1200    Physical Exam:    VS:  BP 106/64 (BP Location: Right Arm, Patient Position: Sitting)   Pulse (!) 107   Ht 5' 1" (1.549 m)   Wt 100 lb (45.4 kg)   SpO2 98%   BMI 18.89 kg/m     Wt Readings from Last 3 Encounters:  05/04/21 100 lb (45.4 kg)  03/31/21 103  lb 3.2 oz (46.8 kg)  10/22/20 109 lb (49.4 kg)     GEN:  Well nourished, well developed in no acute distress HEENT: Normal NECK: No JVD; No carotid bruits LYMPHATICS: No lymphadenopathy CARDIAC: RRR, no murmurs, no rubs, no gallops RESPIRATORY:  Clear to auscultation without rales, wheezing or rhonchi  ABDOMEN: Soft, non-tender, non-distended MUSCULOSKELETAL:  No edema; No deformity  SKIN: Warm and dry LOWER EXTREMITIES: no swelling NEUROLOGIC:  Alert and oriented x 3 PSYCHIATRIC:  Normal affect   ASSESSMENT:    1. Dyslipidemia   2. Essential hypertension   3. Mixed hyperlipidemia   4. Atypical chest pain    PLAN:    In order of problems listed above:  Essential hypertension blood pressure seems to be well controlled actually she had to reduce some of this medications because of blood pressure being low. Dyslipidemia should be referred to lipid clinic.  I will give her prescription for Zetia, still not well controlled. Smoking which is still ongoing, of course I spent a great deal of time talking to her about need to quit she which she understand Atypical chest pain, denies having any, coronary CT angio did not show any significant stenosis.   Medication Adjustments/Labs and Tests Ordered: Current medicines are reviewed at length with the patient today.  Concerns regarding medicines are  outlined above.  Orders Placed This Encounter  Procedures   Lipid panel   TSH   Comp Met (CMET)   AMB Referral to Advanced Lipid Disorders Clinic   Medication changes: No orders of the defined types were placed in this encounter.   Signed, Park Liter, MD, [facility] 05/04/2021 3:26 PM    [place]

## 2021-05-04 NOTE — Patient Instructions (Signed)
Medication Instructions:  Your physician recommends that you continue on your current medications as directed. Please refer to the Current Medication list given to you today.  *If you need a refill on your cardiac medications before your next appointment, please call your pharmacy*   Lab Work: Your physician recommends that you return for lab work today: lipid, tsh, cmp If you have labs (blood work) drawn today and your tests are completely normal, you will receive your results only by: [place] (if you have MyChart) OR A paper copy in the mail If you have any lab test that is abnormal or we need to change your treatment, we will call you to review the results.   Testing/Procedures: None   Follow-Up: At [facility], you and your health needs are our priority.  As part of our continuing mission to provide you with exceptional heart care, we have created designated Provider Care Teams.  These Care Teams include your primary Cardiologist (physician) and Advanced Practice Providers (APPs -  Physician Assistants and Nurse Practitioners) who all work together to provide you with the care you need, when you need it.  We recommend signing up for the patient portal called "MyChart".  Sign up information is provided on this After Visit Summary.  MyChart is used to connect with patients for Virtual Visits (Telemedicine).  Patients are able to view lab/test results, encounter notes, upcoming appointments, etc.  Non-urgent messages can be sent to your provider as well.   To learn more about what you can do with MyChart, go to [email or web address].    Your next appointment:   6 month(s)  The format for your next appointment:   In Person  Provider:   Jenne Campus, MD    Other Instructions

## 2021-05-04 NOTE — Progress Notes (Signed)
l °

## 2021-05-05 LAB — COMPREHENSIVE METABOLIC PANEL
ALT: 6 IU/L (ref 0–32)
AST: 11 IU/L (ref 0–40)
Albumin/Globulin Ratio: 1.8 (ref 1.2–2.2)
Albumin: 4.7 g/dL (ref 3.8–4.9)
Alkaline Phosphatase: 56 IU/L (ref 44–121)
BUN/Creatinine Ratio: 9 (ref 9–23)
BUN: 10 mg/dL (ref 6–24)
Bilirubin Total: 0.2 mg/dL (ref 0.0–1.2)
CO2: 20 mmol/L (ref 20–29)
Calcium: 10 mg/dL (ref 8.7–10.2)
Chloride: 100 mmol/L (ref 96–106)
Creatinine, Ser: 1.13 mg/dL — ABNORMAL HIGH (ref 0.57–1.00)
Globulin, Total: 2.6 g/dL (ref 1.5–4.5)
Glucose: 84 mg/dL (ref 70–99)
Potassium: 4.1 mmol/L (ref 3.5–5.2)
Sodium: 137 mmol/L (ref 134–144)
Total Protein: 7.3 g/dL (ref 6.0–8.5)
eGFR: 58 mL/min/{1.73_m2} — ABNORMAL LOW (ref >59)

## 2021-05-05 LAB — LIPID PANEL
Chol/HDL Ratio: 4.7 ratio — ABNORMAL HIGH (ref 0.0–4.4)
Cholesterol, Total: 201 mg/dL — ABNORMAL HIGH (ref 100–199)
HDL: 43 mg/dL (ref >39)
LDL Chol Calc (NIH): 135 mg/dL — ABNORMAL HIGH (ref 0–99)
Triglycerides: 127 mg/dL (ref 0–149)
VLDL Cholesterol Cal: 23 mg/dL (ref 5–40)

## 2021-05-05 LAB — TSH: TSH: 0.893 u[IU]/mL (ref 0.450–4.500)

## 2021-05-05 NOTE — Addendum Note (Signed)
Addended by: Truddie Hidden on: 05/05/2021 12:40 PM   Modules accepted: Orders

## 2021-06-06 ENCOUNTER — Ambulatory Visit: Payer: Medicare HMO

## 2021-06-07 ENCOUNTER — Ambulatory Visit: Payer: Medicare HMO | Admitting: Pharmacist

## 2021-06-07 ENCOUNTER — Other Ambulatory Visit: Payer: Self-pay

## 2021-06-07 VITALS — BP 108/74 | HR 78 | Resp 15 | Ht 60.0 in | Wt 107.8 lb

## 2021-06-07 DIAGNOSIS — E782 Mixed hyperlipidemia: Secondary | ICD-10-CM | POA: Diagnosis not present

## 2021-06-07 DIAGNOSIS — E7800 Pure hypercholesterolemia, unspecified: Secondary | ICD-10-CM

## 2021-06-07 NOTE — Progress Notes (Signed)
Patient ID: Crystal Lyons                 DOB: [birth year]                    MRN: [id number]     HPI: Crystal Lyons is a 55 y.o. female patient referred to lipid clinic by [facility]. PMH is significant for HTN, smoking, HLD, and chest pain.     Patient presents today with mother. Reported that atorvastatin caused irritability and rosuvastatin caused muscle pain.  Currently on Zetia 10mg  but only takes 3 times a week because she says it causes her constipation.    Smokes 1-2 packs of cigarettes a day. Denies alcohol use.  Eats out frequently usually [place] food.  Drinks pepsi and sweet tea daily. Makes her own tea.    Has family history of CAD.  Father had MI as did both maternal grandparents.   Current Medications:  Zaetia 10mg  three times a week  Intolerances:  Atorvastatin (agitation) Rosuvastatin (myalgias) Simvastatin (myalgia)  Risk Factors:  Family history Smoking HLD  LDL goal: <70  Labs: TC 201, Trigs 127, LDL 135, HDL 43 (05/04/21 - on Zetia 10mg  three times weekly)  Past Medical History:  Diagnosis Date   Anxiety    Anxiety disorder 08/19/2013   Atypical chest pain    Barrett esophagus    Brachial neuritis 11/23/2012   Carpal tunnel syndrome    right and left wrists   Cervical post-laminectomy syndrome 08/19/2013   Chronic headache disorder 05/08/2016   Chronic neck pain 03/15/2015   DDD (degenerative disc disease), lumbosacral 08/19/2013   Disorder of bone and cartilage 04/21/2013   Dyslipidemia 01/21/2015   Dysrhythmia    Encounter for therapeutic drug monitoring 05/01/2019   Epilepsy ([place]) 02/24/2014   Essential hypertension 11/20/2017   GERD (gastroesophageal reflux disease)    Headache 03/09/2014   Headache(784.0) 03/09/2014   Heart murmur    Hiatal hernia    Hypercholesterolemia 11/23/2012   Hyperlipidemia    Hypertension    Low back pain 11/23/2012   Lumbar radicular pain 04/21/2013   Myalgia and myositis 08/19/2013   Osteoarthritis of spine with  radiculopathy, 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/15/2015   Osteopenia    Seizure disorder ([place]) 08/19/2013   Seizures ([place]) 09/27/2012   Shingles    Sinus tachycardia 08/16/2018   Sleep apnea    Sleep disorder 09/27/2012   Smoking 11/20/2017   Vitamin D deficiency 02/24/2014    Current Outpatient Medications on File Prior to Visit  Medication Sig Dispense Refill   albuterol (PROVENTIL HFA;VENTOLIN HFA) 108 (90 Base) MCG/ACT inhaler Inhale 2 puffs into the lungs every 6 (six) hours as needed for wheezing or shortness of breath.     ALPRAZolam (XANAX) 0.5 MG tablet Take 0.5 mg by mouth 3 (three) times daily as needed for anxiety.     clobetasol ointment (TEMOVATE) 7.84 % Apply 1 application topically 2 (two) times daily.     denosumab (PROLIA) 60 MG/ML SOSY injection Inject 60 mg into the skin every 6 (six) months.     dicyclomine (BENTYL) 10 MG capsule Take 10 mg by mouth every 6 (six) hours as needed for spasms. For abdominal pain     ezetimibe (ZETIA) 10 MG tablet TAKE ONE TABLET BY MOUTH DAILY (Patient taking differently: Take 10 mg by mouth daily.) 90 tablet 1   famotidine (PEPCID) 40 MG tablet Take 40 mg by mouth daily.     Fexofenadine-Pseudoephedrine (ALLEGRA-D 12  HOUR PO) Take 1 tablet by mouth every other day.     HYDROcodone-acetaminophen (NORCO/VICODIN) 5-325 MG per tablet Take 1 tablet by mouth as needed for moderate pain or severe pain (Up tp 4 times a day).     lansoprazole (PREVACID) 30 MG capsule Take 30 mg by mouth daily.      omeprazole (PRILOSEC) 20 MG capsule Take 40 mg by mouth as needed (indigestion).     promethazine (PHENERGAN) 12.5 MG tablet Take 12.5 mg by mouth every 8 (eight) hours as needed for nausea or vomiting.      spironolactone-hydrochlorothiazide (ALDACTAZIDE) 25-25 MG tablet Take 1 tablet by mouth daily.     tiZANidine (ZANAFLEX) 4 MG capsule Take 4 mg by mouth 3 (three) times daily.     VIVELLE-DOT 0.05 MG/24HR Place 1 patch onto the skin 2 (two) times a week.      No  current facility-administered medications on file prior to visit.    Allergies  Allergen Reactions   Adenosine Anaphylaxis    Heart block   Azithromycin Swelling   Aleve [Naproxen Sodium]    Clonazepam     Other reaction(s): Delusions (intolerance)   Erythromycin Base Hives and Swelling   Feldene [Piroxicam]    Keflex [Cephalexin]    Mobic [Meloxicam]    Neurontin [Gabapentin]    Other     Any anti-inflammatory   Penicillins Other (See Comments)    [place]ts DID THE REACTION INVOLVE: Swelling of the face/tongue/throat, SOB, or low BP? Unknown Sudden or severe rash/hives, skin peeling, or the inside of the mouth or nose? Unknown Did it require medical treatment? Unknown When did it last happen?   Childhood allergy    If all above answers are NO, may proceed with cephalosporin use.    Tape     Medical adhesive tape   Vioxx [Rofecoxib]     Assessment/Plan:  1. Hyperlipidemia - Patient's most recent LDL 135 which is above goal of <70.  Since she is intolerant to statins and currently on Zetia three times weekly, will need further pharmacologic LDL lowering.  Recommend PCSK9i therapy.  Using [place] [employer], educated patient on mechanism of action, storage, site selection, administration, and possible adverse reactions.  Will complete PA and contact patient. May need grant assistance.  Recheck lipid panel in 2-3 months.  Continue Zetia 10mg  three times weekly Start Repatha/Praluent SQ Q 14 days Recheck lipid panel in 2-3 months  Karren Cobble, PharmD, [place], [place], [place], [place], [place], [address] Phone: [phone], Fax: [phone]

## 2021-06-07 NOTE — Patient Instructions (Signed)
It was nice meeting you today  We would like your LDL (bad cholesterol) to be less than 70  You can continue your ezetimibe 10mg  once a day  We will start a new medication called Repatha 140mg  which you will take once every 2 weeks  We will complete the prior authorization for you and contact you when it is approved  Once you start the medication we will recheck your cholesterol in 2-3 months  Please call with any questions!  Karren Cobble, PharmD, BCACP, [place], [place], [place], [place], [address] Phone: [phone], Fax: [phone]

## 2021-06-08 DIAGNOSIS — K5900 Constipation, unspecified: Secondary | ICD-10-CM | POA: Diagnosis not present

## 2021-06-08 DIAGNOSIS — R1032 Left lower quadrant pain: Secondary | ICD-10-CM | POA: Diagnosis not present

## 2021-06-08 DIAGNOSIS — K219 Gastro-esophageal reflux disease without esophagitis: Secondary | ICD-10-CM | POA: Diagnosis not present

## 2021-06-08 DIAGNOSIS — R634 Abnormal weight loss: Secondary | ICD-10-CM | POA: Diagnosis not present

## 2021-06-08 DIAGNOSIS — K2270 Barrett's esophagus without dysplasia: Secondary | ICD-10-CM | POA: Diagnosis not present

## 2021-06-08 DIAGNOSIS — R1310 Dysphagia, unspecified: Secondary | ICD-10-CM | POA: Diagnosis not present

## 2021-06-08 DIAGNOSIS — K648 Other hemorrhoids: Secondary | ICD-10-CM | POA: Diagnosis not present

## 2021-06-14 DIAGNOSIS — H16149 Punctate keratitis, unspecified eye: Secondary | ICD-10-CM | POA: Diagnosis not present

## 2021-06-23 DIAGNOSIS — Z20828 Contact with and (suspected) exposure to other viral communicable diseases: Secondary | ICD-10-CM | POA: Diagnosis not present

## 2021-06-23 DIAGNOSIS — J329 Chronic sinusitis, unspecified: Secondary | ICD-10-CM | POA: Diagnosis not present

## 2021-06-23 DIAGNOSIS — F17210 Nicotine dependence, cigarettes, uncomplicated: Secondary | ICD-10-CM | POA: Diagnosis not present

## 2021-07-01 DIAGNOSIS — Z01419 Encounter for gynecological examination (general) (routine) without abnormal findings: Secondary | ICD-10-CM | POA: Diagnosis not present

## 2021-07-12 DIAGNOSIS — M5030 Other cervical disc degeneration, unspecified cervical region: Secondary | ICD-10-CM | POA: Diagnosis not present

## 2021-07-12 DIAGNOSIS — M5137 Other intervertebral disc degeneration, lumbosacral region: Secondary | ICD-10-CM | POA: Diagnosis not present

## 2021-07-12 DIAGNOSIS — M961 Postlaminectomy syndrome, not elsewhere classified: Secondary | ICD-10-CM | POA: Diagnosis not present

## 2021-07-12 DIAGNOSIS — J439 Emphysema, unspecified: Secondary | ICD-10-CM | POA: Diagnosis not present

## 2021-07-15 ENCOUNTER — Other Ambulatory Visit: Payer: Self-pay | Admitting: Obstetrics and Gynecology

## 2021-07-15 DIAGNOSIS — Z1231 Encounter for screening mammogram for malignant neoplasm of breast: Secondary | ICD-10-CM

## 2021-07-20 DIAGNOSIS — H1013 Acute atopic conjunctivitis, bilateral: Secondary | ICD-10-CM | POA: Diagnosis not present

## 2021-08-02 ENCOUNTER — Telehealth: Payer: Self-pay | Admitting: Pharmacist

## 2021-08-02 DIAGNOSIS — E785 Hyperlipidemia, unspecified: Secondary | ICD-10-CM

## 2021-08-02 NOTE — Telephone Encounter (Signed)
° °  Pt c/o medication issue:  1. Name of Medication: repatha  2. How are you currently taking this medication (dosage and times per day)?   3. Are you having a reaction (difficulty breathing--STAT)?   4. What is your medication issue? Pt is calling to get an update about her repatha

## 2021-08-02 NOTE — Telephone Encounter (Signed)
Pt seen 06/07/21 to start [place], I do not see that any follow up to initiate medication was done. Please follow up with patient.

## 2021-08-03 NOTE — Telephone Encounter (Signed)
Per verbal orders from kristin alvstad rph I completed repatha 140mg  q2w pa and it has been approved until 01/30/22.  Lipid/hepatic panel ordered and released. I will route to her per her request to send rx and call the pt to notify.

## 2021-08-04 MED ORDER — REPATHA SURECLICK 140 MG/ML ~~LOC~~ SOAJ: 140.0000 mg | SUBCUTANEOUS | 11 refills | Status: DC

## 2021-08-04 NOTE — Telephone Encounter (Signed)
Thank you Haleigh.  When you contact the patient please check to see if she will need grant assistance

## 2021-08-04 NOTE — Telephone Encounter (Signed)
Called an dtold pt repatha approved, rx sent, pt instructed to complete fasting lab work post 4th dose, hwf approved and pt voiced understanding . Pharmacy Card CARD NO. [id number]   CARD STATUS Inactive   BIN [id number]   PCN [id number]   PC GROUP [id number]   HELP DESK [phone]   PROVIDER PDMI   PROCESSOR PDMI

## 2021-08-19 ENCOUNTER — Ambulatory Visit
Admission: RE | Admit: 2021-08-19 | Discharge: 2021-08-19 | Disposition: A | Discharge status: Home / Self Care | Payer: Medicare HMO | Source: Ambulatory Visit | Attending: Obstetrics and Gynecology | Admitting: Obstetrics and Gynecology

## 2021-08-19 DIAGNOSIS — Z1231 Encounter for screening mammogram for malignant neoplasm of breast: Secondary | ICD-10-CM

## 2021-08-24 DIAGNOSIS — S0501XA Injury of conjunctiva and corneal abrasion without foreign body, right eye, initial encounter: Secondary | ICD-10-CM | POA: Diagnosis not present

## 2021-08-24 DIAGNOSIS — S0502XA Injury of conjunctiva and corneal abrasion without foreign body, left eye, initial encounter: Secondary | ICD-10-CM | POA: Diagnosis not present

## 2021-08-29 ENCOUNTER — Other Ambulatory Visit: Payer: Self-pay

## 2021-08-29 MED ORDER — REPATHA SURECLICK 140 MG/ML ~~LOC~~ SOAJ: 140.0000 mg | SUBCUTANEOUS | 11 refills | Status: DC

## 2021-08-30 ENCOUNTER — Other Ambulatory Visit: Payer: Self-pay | Admitting: Cardiology

## 2021-08-31 DIAGNOSIS — G40909 Epilepsy, unspecified, not intractable, without status epilepticus: Secondary | ICD-10-CM | POA: Diagnosis not present

## 2021-08-31 DIAGNOSIS — Z6820 Body mass index (BMI) 20.0-20.9, adult: Secondary | ICD-10-CM | POA: Diagnosis not present

## 2021-08-31 DIAGNOSIS — M12812 Other specific arthropathies, not elsewhere classified, left shoulder: Secondary | ICD-10-CM | POA: Diagnosis not present

## 2021-08-31 DIAGNOSIS — G720 Drug-induced myopathy: Secondary | ICD-10-CM | POA: Diagnosis not present

## 2021-08-31 DIAGNOSIS — M7552 Bursitis of left shoulder: Secondary | ICD-10-CM | POA: Diagnosis not present

## 2021-08-31 DIAGNOSIS — F17210 Nicotine dependence, cigarettes, uncomplicated: Secondary | ICD-10-CM | POA: Diagnosis not present

## 2021-08-31 DIAGNOSIS — I700 Atherosclerosis of aorta: Secondary | ICD-10-CM | POA: Diagnosis not present

## 2021-08-31 DIAGNOSIS — M75102 Unspecified rotator cuff tear or rupture of left shoulder, not specified as traumatic: Secondary | ICD-10-CM | POA: Diagnosis not present

## 2021-08-31 DIAGNOSIS — E782 Mixed hyperlipidemia: Secondary | ICD-10-CM | POA: Diagnosis not present

## 2021-09-14 DIAGNOSIS — Z23 Encounter for immunization: Secondary | ICD-10-CM | POA: Diagnosis not present

## 2021-09-29 ENCOUNTER — Ambulatory Visit: Payer: Medicare HMO | Admitting: Neurology

## 2021-09-29 DIAGNOSIS — M5137 Other intervertebral disc degeneration, lumbosacral region: Secondary | ICD-10-CM | POA: Diagnosis not present

## 2021-09-29 DIAGNOSIS — Z79899 Other long term (current) drug therapy: Secondary | ICD-10-CM | POA: Diagnosis not present

## 2021-09-29 DIAGNOSIS — M5030 Other cervical disc degeneration, unspecified cervical region: Secondary | ICD-10-CM | POA: Diagnosis not present

## 2021-09-29 DIAGNOSIS — M961 Postlaminectomy syndrome, not elsewhere classified: Secondary | ICD-10-CM | POA: Diagnosis not present

## 2021-10-05 DIAGNOSIS — K649 Unspecified hemorrhoids: Secondary | ICD-10-CM | POA: Diagnosis not present

## 2021-10-05 DIAGNOSIS — R109 Unspecified abdominal pain: Secondary | ICD-10-CM | POA: Diagnosis not present

## 2021-10-05 DIAGNOSIS — K259 Gastric ulcer, unspecified as acute or chronic, without hemorrhage or perforation: Secondary | ICD-10-CM | POA: Diagnosis not present

## 2021-10-05 DIAGNOSIS — R634 Abnormal weight loss: Secondary | ICD-10-CM | POA: Diagnosis not present

## 2021-10-05 DIAGNOSIS — K2270 Barrett's esophagus without dysplasia: Secondary | ICD-10-CM | POA: Diagnosis not present

## 2021-10-06 DIAGNOSIS — M25512 Pain in left shoulder: Secondary | ICD-10-CM | POA: Diagnosis not present

## 2021-10-06 DIAGNOSIS — M75102 Unspecified rotator cuff tear or rupture of left shoulder, not specified as traumatic: Secondary | ICD-10-CM | POA: Diagnosis not present

## 2021-10-06 DIAGNOSIS — R7302 Impaired glucose tolerance (oral): Secondary | ICD-10-CM | POA: Diagnosis not present

## 2021-10-06 DIAGNOSIS — M12811 Other specific arthropathies, not elsewhere classified, right shoulder: Secondary | ICD-10-CM | POA: Diagnosis not present

## 2021-10-06 DIAGNOSIS — Z205 Contact with and (suspected) exposure to viral hepatitis: Secondary | ICD-10-CM | POA: Diagnosis not present

## 2021-10-06 DIAGNOSIS — N1831 Chronic kidney disease, stage 3a: Secondary | ICD-10-CM | POA: Diagnosis not present

## 2021-10-06 DIAGNOSIS — M75101 Unspecified rotator cuff tear or rupture of right shoulder, not specified as traumatic: Secondary | ICD-10-CM | POA: Diagnosis not present

## 2021-10-06 DIAGNOSIS — Z6820 Body mass index (BMI) 20.0-20.9, adult: Secondary | ICD-10-CM | POA: Diagnosis not present

## 2021-10-06 DIAGNOSIS — M12812 Other specific arthropathies, not elsewhere classified, left shoulder: Secondary | ICD-10-CM | POA: Diagnosis not present

## 2021-10-13 DIAGNOSIS — H02831 Dermatochalasis of right upper eyelid: Secondary | ICD-10-CM | POA: Diagnosis not present

## 2021-10-13 DIAGNOSIS — D485 Neoplasm of uncertain behavior of skin: Secondary | ICD-10-CM | POA: Diagnosis not present

## 2021-10-13 DIAGNOSIS — H57813 Brow ptosis, bilateral: Secondary | ICD-10-CM | POA: Diagnosis not present

## 2021-10-13 DIAGNOSIS — D23111 Other benign neoplasm of skin of right upper eyelid, including canthus: Secondary | ICD-10-CM | POA: Diagnosis not present

## 2021-10-13 DIAGNOSIS — H02403 Unspecified ptosis of bilateral eyelids: Secondary | ICD-10-CM | POA: Diagnosis not present

## 2021-10-19 DIAGNOSIS — S46012A Strain of muscle(s) and tendon(s) of the rotator cuff of left shoulder, initial encounter: Secondary | ICD-10-CM | POA: Diagnosis not present

## 2021-10-19 DIAGNOSIS — M75102 Unspecified rotator cuff tear or rupture of left shoulder, not specified as traumatic: Secondary | ICD-10-CM | POA: Diagnosis not present

## 2021-10-24 DIAGNOSIS — M25512 Pain in left shoulder: Secondary | ICD-10-CM | POA: Diagnosis not present

## 2021-10-24 DIAGNOSIS — S46812A Strain of other muscles, fascia and tendons at shoulder and upper arm level, left arm, initial encounter: Secondary | ICD-10-CM | POA: Diagnosis not present

## 2021-11-03 ENCOUNTER — Ambulatory Visit: Payer: Medicare HMO | Admitting: Cardiology

## 2021-11-03 ENCOUNTER — Encounter: Payer: Self-pay | Admitting: Cardiology

## 2021-11-03 VITALS — BP 110/70 | HR 86 | Ht 62.0 in | Wt 107.2 lb

## 2021-11-03 DIAGNOSIS — F17200 Nicotine dependence, unspecified, uncomplicated: Secondary | ICD-10-CM | POA: Diagnosis not present

## 2021-11-03 DIAGNOSIS — R0989 Other specified symptoms and signs involving the circulatory and respiratory systems: Secondary | ICD-10-CM | POA: Diagnosis not present

## 2021-11-03 DIAGNOSIS — E785 Hyperlipidemia, unspecified: Secondary | ICD-10-CM | POA: Diagnosis not present

## 2021-11-03 DIAGNOSIS — I10 Essential (primary) hypertension: Secondary | ICD-10-CM

## 2021-11-03 MED ORDER — NITROGLYCERIN 0.4 MG SL SUBL: 0.4000 mg | SUBLINGUAL_TABLET | SUBLINGUAL | 5 refills | Status: DC | PRN

## 2021-11-03 NOTE — Progress Notes (Signed)
Cardiology Office Note:    Date:  11/03/2021   ID:  LACYE MCCARN, DOB 16-Apr-1966, MRN [id number]  PCP:  Street, Sharon Mt, MD  Cardiologist:  Jenne Campus, MD    Referring MD: Street, Sharon Mt, *   Chief Complaint  Patient presents with   Chest Pain    History of Present Illness:    Crystal Lyons is a 56 y.o. female with past medical history significant for ongoing smoking, Barrett's esophagus, essential hypertension.  She comes today 2 months of follow-up she still complain of anginal chest pain she said she got the pain all the time there palpation of her abdomen reveals some tenderness in the epigastrium she says she is always tender over there she is being followed by GI specialist she did see him in April she was giving some stomach medication with partial relief.  She also tell me sometimes when she bent forward sometimes when she walks the pain will get more intense.  Still continues to smoke.  She lost significant amount of weight.  Past Medical History:  Diagnosis Date   Anxiety    Anxiety disorder 08/19/2013   Atypical chest pain    Barrett esophagus    Brachial neuritis 11/23/2012   Carpal tunnel syndrome    right and left wrists   Cervical post-laminectomy syndrome 08/19/2013   Chronic headache disorder 05/08/2016   Chronic neck pain 03/15/2015   DDD (degenerative disc disease), lumbosacral 08/19/2013   Disorder of bone and cartilage 04/21/2013   Dyslipidemia 01/21/2015   Dysrhythmia    Encounter for therapeutic drug monitoring 05/01/2019   Epilepsy ([place]) 02/24/2014   Essential hypertension 11/20/2017   GERD (gastroesophageal reflux disease)    Headache 03/09/2014   Headache(784.0) 03/09/2014   Heart murmur    Hiatal hernia    Hypercholesterolemia 11/23/2012   Hyperlipidemia    Hypertension    Low back pain 11/23/2012   Lumbar radicular pain 04/21/2013   Myalgia and myositis 08/19/2013   Osteoarthritis of spine with radiculopathy, 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/15/2015    Osteopenia    Seizure disorder ([place]) 08/19/2013   Seizures ([place]) 09/27/2012   Shingles    Sinus tachycardia 08/16/2018   Sleep apnea    Sleep disorder 09/27/2012   Smoking 11/20/2017   Vitamin D deficiency 02/24/2014    Past Surgical History:  Procedure Laterality Date   BRAIN SURGERY  01/06/1985   BRAIN SURGERY Right 1986   "right temporal lobe"   BREAST BIOPSY Right 12/04/2005   CHOLECYSTECTOMY     CYST REMOVAL TRUNK     ears     EYE SURGERY     cyst removed   GALLBLADDER SURGERY N/A 2009   NECK SURGERY     SHOULDER SURGERY Bilateral 2009, 2010   UPPER GI ENDOSCOPY  10/2020   VULVECTOMY N/A 07/04/2018   Procedure: WIDE EXCISION VULVECTOMY;  Surgeon: Vanessa Kick, MD;  Location: [facility];  Service: Gynecology;  Laterality: N/A;    Current Medications: Current Meds  Medication Sig   albuterol (PROVENTIL HFA;VENTOLIN HFA) 108 (90 Base) MCG/ACT inhaler Inhale 2 puffs into the lungs every 6 (six) hours as needed for wheezing or shortness of breath.   ALPRAZolam (XANAX) 0.5 MG tablet Take 0.5 mg by mouth 3 (three) times daily as needed for anxiety.   amitriptyline (ELAVIL) 10 MG tablet Take 10 mg by mouth See admin instructions. 1 tab qhs for 1 week and then 2 tabs qhs   clobetasol ointment (TEMOVATE) 0.05 % Apply  1 application topically 2 (two) times daily.   denosumab (PROLIA) 60 MG/ML SOSY injection Inject 60 mg into the skin every 6 (six) months.   dicyclomine (BENTYL) 10 MG capsule Take 10 mg by mouth every 6 (six) hours as needed for spasms. For abdominal pain   Evolocumab (REPATHA SURECLICK) 818 MG/ML SOAJ Inject 140 mg into the skin every 14 (fourteen) days.   famotidine (PEPCID) 40 MG tablet Take 40 mg by mouth daily.   Fexofenadine-Pseudoephedrine (ALLEGRA-D 12 HOUR PO) Take 1 tablet by mouth every other day.   HYDROcodone-acetaminophen (NORCO/VICODIN) 5-325 MG per tablet Take 1 tablet by mouth as needed for moderate pain or severe pain (Up tp 4 times a day).   lansoprazole  (PREVACID) 30 MG capsule Take 30 mg by mouth daily.    neomycin-bacitracin-polymyxin [phone] OINT Apply 1 application. topically 3 (three) times daily. 3.5   omeprazole (PRILOSEC) 20 MG capsule Take 40 mg by mouth as needed (indigestion).   promethazine (PHENERGAN) 12.5 MG tablet Take 12.5 mg by mouth every 8 (eight) hours as needed for nausea or vomiting.    spironolactone-hydrochlorothiazide (ALDACTAZIDE) 25-25 MG tablet Take 1 tablet by mouth daily.   tiZANidine (ZANAFLEX) 4 MG capsule Take 4 mg by mouth 3 (three) times daily.   VIVELLE-DOT 0.05 MG/24HR Place 1 patch onto the skin 2 (two) times a week.      Allergies:   Adenosine, Azithromycin, Aleve [naproxen sodium], Atorvastatin, Clonazepam, Clozapine, Crestor [rosuvastatin], Erythromycin base, Feldene [piroxicam], Keflex [cephalexin], Mobic [meloxicam], Neurontin [gabapentin], Other, Penicillins, Simvastatin, Tape, Vioxx [rofecoxib], and Zetia [ezetimibe]   Social History   Socioeconomic History   Marital status: Single    Spouse name: Not on file   Number of children: 0   Years of education: 8   Highest education level: Not on file  Occupational History   Occupation: Disabled  Tobacco Use   Smoking status: Every Day    Packs/day: 1.50    Types: Cigarettes   Smokeless tobacco: Former    Types: Nurse, children's Use: Former  Substance and Sexual Activity   Alcohol use: No   Drug use: No   Sexual activity: Not on file  Other Topics Concern   Not on file  Social History Narrative   Patient lives at home with  mother. Patient has no children. Patient is single. Patient has a 8th grade education. Patient is disabled   Social Determinants of Radio broadcast assistant Strain: Not on file  Food Insecurity: Not on file  Transportation Needs: Not on file  Physical Activity: Not on file  Stress: Not on file  Social Connections: Not on file     Family History: The patient's family history includes Cancer in  her father and mother; Cirrhosis in her sister; Diabetes in her father; Heart failure in her father; Hypertension in her father and mother; Rheum arthritis in her mother; Stroke in her mother; Thyroid disease in her mother. ROS:   Please see the history of present illness.    All 14 point review of systems negative except as described per history of present illness  EKGs/Labs/Other Studies Reviewed:      Recent Labs: 05/04/2021: ALT 6; BUN 10; Creatinine, Ser 1.13; Potassium 4.1; Sodium 137; TSH 0.893  Recent Lipid Panel    Component Value Date/Time   CHOL 201 (H) 05/04/2021 1528   TRIG 127 05/04/2021 1528   HDL 43 05/04/2021 1528   CHOLHDL 4.7 (H) 05/04/2021 1528  [place] 135 (H) 05/04/2021 1528    Physical Exam:    VS:  BP 110/70 (BP Location: Left Arm, Patient Position: Sitting)   Pulse 86   Ht '5\' 2"'$  (1.575 m)   Wt 107 lb 3.2 oz (48.6 kg)   SpO2 99%   BMI 19.61 kg/m     Wt Readings from Last 3 Encounters:  11/03/21 107 lb 3.2 oz (48.6 kg)  06/07/21 107 lb 12.8 oz (48.9 kg)  05/04/21 100 lb (45.4 kg)     GEN:  Well nourished, well developed in no acute distress HEENT: Normal NECK: No JVD; soft bruit left side LYMPHATICS: No lymphadenopathy CARDIAC: RRR, no murmurs, no rubs, no gallops RESPIRATORY:  Clear to auscultation without rales, wheezing or rhonchi  ABDOMEN: Soft, non-tender, non-distended MUSCULOSKELETAL:  No edema; No deformity  SKIN: Warm and dry LOWER EXTREMITIES: no swelling NEUROLOGIC:  Alert and oriented x 3 PSYCHIATRIC:  Normal affect   ASSESSMENT:    1. Essential hypertension   2. Dyslipidemia   3. Smoking    PLAN:    In order of problems listed above:  Essential hypertension blood pressure well controlled continue present management. Chest pain with coronary CT angio done at the end of last year being calcium score 0 and no obstructive disease in the matter-of-fact no disease whatsoever.  I am really puzzled by his symptomatology.  I  will give her prescription for nitroglycerin ask her to take it when she has a pain if she does have relief then we will give her long-acting nitroglycerin however her coronary CT angio that was done for exactly the same kind of pain was perfectly normal.  The key from cardiac point of view right now is risk factors modifications and of course we spoke a lot about quitting smoking which she understands she will try to do. Dyslipidemia she is on Repatha  The last fasting lipid profile from November of last year with LDL of 135 HDL 43. I will schedule her to have carotic ultrasound to make sure there is no significant stenosis there she does have soft bruit there   Medication Adjustments/Labs and Tests Ordered: Current medicines are reviewed at length with the patient today.  Concerns regarding medicines are outlined above.  No orders of the defined types were placed in this encounter.  Medication changes: No orders of the defined types were placed in this encounter.   Signed, Park Liter, MD, [facility] 11/03/2021 3:34 PM    [place]

## 2021-11-03 NOTE — Patient Instructions (Signed)
Medication Instructions:  Your physician has recommended you make the following change in your medication:  Nitroglycerin 0.4 mg sublingual (under your tongue) as needed for chest pain. If experiencing chest pain, stop what you are doing and sit down. Take 1 nitroglycerin and wait 5 minutes. If chest pain continues, take another nitroglycerin and wait 5 minutes. If chest pain does not subside, take 1 more nitroglycerin and dial 911. You make take a total of 3 nitroglycerin in a 15 minute time frame.   *If you need a refill on your cardiac medications before your next appointment, please call your pharmacy*   Lab Work: NONE If you have labs (blood work) drawn today and your tests are completely normal, you will receive your results only by: [place] (if you have MyChart) OR A paper copy in the mail If you have any lab test that is abnormal or we need to change your treatment, we will call you to review the results.   Testing/Procedures: Your physician has requested that you have a carotid duplex. This test is an ultrasound of the carotid arteries in your neck. It looks at blood flow through these arteries that supply the brain with blood. Allow one hour for this exam. There are no restrictions or special instructions.    Follow-Up: At [facility], you and your health needs are our priority.  As part of our continuing mission to provide you with exceptional heart care, we have created designated Provider Care Teams.  These Care Teams include your primary Cardiologist (physician) and Advanced Practice Providers (APPs -  Physician Assistants and Nurse Practitioners) who all work together to provide you with the care you need, when you need it.  We recommend signing up for the patient portal called "MyChart".  Sign up information is provided on this After Visit Summary.  MyChart is used to connect with patients for Virtual Visits (Telemedicine).  Patients are able to view lab/test results,  encounter notes, upcoming appointments, etc.  Non-urgent messages can be sent to your provider as well.   To learn more about what you can do with MyChart, go to [email or web address].    Your next appointment:   6 month(s)  The format for your next appointment:   In Person  Provider:   Jenne Campus, MD    Other Instructions   Important Information About Sugar

## 2021-11-09 ENCOUNTER — Ambulatory Visit (INDEPENDENT_AMBULATORY_CARE_PROVIDER_SITE_OTHER): Payer: Medicare HMO

## 2021-11-09 DIAGNOSIS — R0989 Other specified symptoms and signs involving the circulatory and respiratory systems: Secondary | ICD-10-CM

## 2021-11-16 ENCOUNTER — Telehealth: Payer: Self-pay

## 2021-11-16 NOTE — Telephone Encounter (Signed)
Results reviewed with pt as per Dr. Krasowski's note.  Pt verbalized understanding and had no additional questions. Routed to PCP  

## 2021-11-30 ENCOUNTER — Ambulatory Visit: Payer: Medicare HMO | Admitting: Cardiology

## 2021-12-29 ENCOUNTER — Telehealth: Payer: Self-pay

## 2021-12-29 NOTE — Telephone Encounter (Signed)
   [facility] Pre-operative Risk Assessment    Request for surgical clearance:  What type of surgery is being performed? Teeth extraction with sedation   When is this surgery scheduled? TBD   What type of clearance is required (medical clearance vs. Pharmacy clearance to hold med vs. Both)? Both  Are there any medications that need to be held prior to surgery and how long?Not specified    Practice name and name of physician performing surgery? [facility]/Dr. Donavan Burnet   What is your office phone number:  [phone]   7.   What is your office fax number: [phone]  8.   Anesthesia type (None, local, MAC, general) ? IV sedation   Basil Dess Annora Guderian 12/29/2021, 4:40 PM  _________________________________________________________________   (provider comments below)

## 2021-12-30 NOTE — Telephone Encounter (Signed)
I s/w the [facility] office today and confirmed the procedure:   PROCEDURE: 3 TEETH SURGICALLY EXTRACTED WITH ALVEOLOPLASTY

## 2021-12-30 NOTE — Telephone Encounter (Signed)
Pt has IN OFFICE appt with Dr. Agustin Cree 01/09/22, which pre op clearance can be addressed at that time. Preop provider ok with appt 01/09/22 with MD for pre op assessment. I will send FYI to requesting office the pt has appt 01/09/22.

## 2021-12-30 NOTE — Telephone Encounter (Signed)
   Name: Crystal Lyons  DOB: Oct 16, 1965  MRN: [id number]  Primary Cardiologist: Jenne Campus, MD  Chart reviewed as part of pre-operative protocol coverage. Because of [name]'s past medical history and time since last visit, she will require a follow-up tele visit in order to better assess preoperative cardiovascular risk.  Pre-op covering staff: - Please schedule appointment and call patient to inform them. If patient already had an upcoming appointment within acceptable timeframe, please add "pre-op clearance" to the appointment notes so provider is aware. - Please contact requesting surgeon's office via preferred method (i.e, phone, fax) to inform them of need for appointment prior to surgery.  I do not see any antiplatelet or anticoagulants on the patient's medication list.  I do not see any indication for antibiotics prior to her 3 teeth being extracted.  [name], PA-C  12/30/2021, 11:48 AM

## 2022-01-02 ENCOUNTER — Telehealth: Payer: Self-pay

## 2022-01-02 NOTE — Telephone Encounter (Signed)
PA started on CMM for Repatha 140 mg. Key [id number]

## 2022-01-04 NOTE — Telephone Encounter (Signed)
PA approved for Repatha from 06/19/21 until 06/18/22.

## 2022-01-05 DIAGNOSIS — K5900 Constipation, unspecified: Secondary | ICD-10-CM | POA: Diagnosis not present

## 2022-01-05 DIAGNOSIS — K2270 Barrett's esophagus without dysplasia: Secondary | ICD-10-CM | POA: Diagnosis not present

## 2022-01-05 DIAGNOSIS — K649 Unspecified hemorrhoids: Secondary | ICD-10-CM | POA: Diagnosis not present

## 2022-01-05 DIAGNOSIS — R1310 Dysphagia, unspecified: Secondary | ICD-10-CM | POA: Diagnosis not present

## 2022-01-05 DIAGNOSIS — K219 Gastro-esophageal reflux disease without esophagitis: Secondary | ICD-10-CM | POA: Diagnosis not present

## 2022-01-05 DIAGNOSIS — R1032 Left lower quadrant pain: Secondary | ICD-10-CM | POA: Diagnosis not present

## 2022-01-09 ENCOUNTER — Encounter: Payer: Self-pay | Admitting: Cardiology

## 2022-01-09 ENCOUNTER — Ambulatory Visit: Payer: Medicare HMO | Admitting: Cardiology

## 2022-01-09 VITALS — BP 98/60 | HR 92 | Ht 60.0 in | Wt 106.2 lb

## 2022-01-09 DIAGNOSIS — I10 Essential (primary) hypertension: Secondary | ICD-10-CM | POA: Diagnosis not present

## 2022-01-09 DIAGNOSIS — K219 Gastro-esophageal reflux disease without esophagitis: Secondary | ICD-10-CM

## 2022-01-09 DIAGNOSIS — E782 Mixed hyperlipidemia: Secondary | ICD-10-CM | POA: Diagnosis not present

## 2022-01-09 DIAGNOSIS — Z01810 Encounter for preprocedural cardiovascular examination: Secondary | ICD-10-CM

## 2022-01-09 NOTE — Progress Notes (Signed)
Cardiology Office Note:    Date:  01/09/2022   ID:  Crystal Lyons, DOB 05/18/1966, MRN [id number]  PCP:  Street, Sharon Mt, MD  Cardiologist:  Jenne Campus, MD    Referring MD: Street, Sharon Mt, *   Chief Complaint  Patient presents with   Clearance TBD    History of Present Illness:    Crystal Lyons is a 56 y.o. female with past medical history significant for ongoing smoking, Barrett's esophagus, essential hypertension, chest pain.  She did have a quite extensive cardiac evaluation performed which included coronary calcium score as well as coronary CT angio which practically showed normal coronaries.  She is coming today to my office because she required 3 procedures she will have gastroscopy endoscopy, she also will have some dental work done she required 3 teeth extraction, she also expecting to get some eye surgery.  None of the surgery is high risk from cardiac standpoint reviewed and I do not see any objections against her having the surgery.  Overall she is doing well.  She denies having any different shortness of breath.  She described to have sharp stabbing-like chest pain in the lower portion of the sternum that will not related to exercise.  Still continues to smoke  Past Medical History:  Diagnosis Date   Anxiety    Anxiety disorder 08/19/2013   Atypical chest pain    Barrett esophagus    Brachial neuritis 11/23/2012   Carpal tunnel syndrome    right and left wrists   Cervical post-laminectomy syndrome 08/19/2013   Chronic headache disorder 05/08/2016   Chronic neck pain 03/15/2015   DDD (degenerative disc disease), lumbosacral 08/19/2013   Disorder of bone and cartilage 04/21/2013   Dyslipidemia 01/21/2015   Dysrhythmia    Encounter for therapeutic drug monitoring 05/01/2019   Epilepsy ([place]) 02/24/2014   Essential hypertension 11/20/2017   GERD (gastroesophageal reflux disease)    Headache 03/09/2014   Headache(784.0) 03/09/2014   Heart murmur    Hiatal hernia     Hypercholesterolemia 11/23/2012   Hyperlipidemia    Hypertension    Low back pain 11/23/2012   Lumbar radicular pain 04/21/2013   Myalgia and myositis 08/19/2013   Osteoarthritis of spine with radiculopathy, 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/15/2015   Osteopenia    Seizure disorder ([place]) 08/19/2013   Seizures ([place]) 09/27/2012   Shingles    Sinus tachycardia 08/16/2018   Sleep apnea    Sleep disorder 09/27/2012   Smoking 11/20/2017   Vitamin D deficiency 02/24/2014    Past Surgical History:  Procedure Laterality Date   BRAIN SURGERY  01/06/1985   BRAIN SURGERY Right 1986   "right temporal lobe"   BREAST BIOPSY Right 12/04/2005   CHOLECYSTECTOMY     CYST REMOVAL TRUNK     ears     EYE SURGERY     cyst removed   GALLBLADDER SURGERY N/A 2009   NECK SURGERY     SHOULDER SURGERY Bilateral 2009, 2010   UPPER GI ENDOSCOPY  10/2020   VULVECTOMY N/A 07/04/2018   Procedure: WIDE EXCISION VULVECTOMY;  Surgeon: Vanessa Kick, MD;  Location: [facility];  Service: Gynecology;  Laterality: N/A;    Current Medications: Current Meds  Medication Sig   albuterol (PROVENTIL HFA;VENTOLIN HFA) 108 (90 Base) MCG/ACT inhaler Inhale 2 puffs into the lungs every 6 (six) hours as needed for wheezing or shortness of breath.   ALPRAZolam (XANAX) 0.5 MG tablet Take 0.5 mg by mouth 3 (three) times daily as needed  for anxiety.   amitriptyline (ELAVIL) 10 MG tablet Take 10 mg by mouth See admin instructions. 1 tab qhs for 1 week and then 2 tabs qhs   clobetasol ointment (TEMOVATE) 6.06 % Apply 1 application topically 2 (two) times daily.   denosumab (PROLIA) 60 MG/ML SOSY injection Inject 60 mg into the skin every 6 (six) months.   dicyclomine (BENTYL) 10 MG capsule Take 10 mg by mouth every 6 (six) hours as needed for spasms. For abdominal pain   Evolocumab (REPATHA SURECLICK) 301 MG/ML SOAJ Inject 140 mg into the skin every 14 (fourteen) days.   ezetimibe (ZETIA) 10 MG tablet Take 1 tablet (10 mg total) by mouth daily.   famotidine  (PEPCID) 40 MG tablet Take 40 mg by mouth daily.   Fexofenadine-Pseudoephedrine (ALLEGRA-D 12 HOUR PO) Take 1 tablet by mouth every other day.   HYDROcodone-acetaminophen (NORCO/VICODIN) 5-325 MG per tablet Take 1 tablet by mouth as needed for moderate pain or severe pain (Up tp 4 times a day).   lansoprazole (PREVACID) 30 MG capsule Take 30 mg by mouth daily.    neomycin-bacitracin-polymyxin [phone] OINT Apply 1 application. topically 3 (three) times daily. 3.5   nitroGLYCERIN (NITROSTAT) 0.4 MG SL tablet Place 1 tablet (0.4 mg total) under the tongue every 5 (five) minutes as needed for chest pain.   omeprazole (PRILOSEC) 20 MG capsule Take 40 mg by mouth as needed (indigestion).   promethazine (PHENERGAN) 12.5 MG tablet Take 12.5 mg by mouth every 8 (eight) hours as needed for nausea or vomiting.    spironolactone-hydrochlorothiazide (ALDACTAZIDE) 25-25 MG tablet Take 1 tablet by mouth daily.   tiZANidine (ZANAFLEX) 4 MG capsule Take 4 mg by mouth 3 (three) times daily.   VIVELLE-DOT 0.05 MG/24HR Place 1 patch onto the skin 2 (two) times a week.      Allergies:   Adenosine, Azithromycin, Aleve [naproxen sodium], Atorvastatin, Clonazepam, Clozapine, Crestor [rosuvastatin], Erythromycin base, Feldene [piroxicam], Keflex [cephalexin], Mobic [meloxicam], Neurontin [gabapentin], Other, Penicillins, Simvastatin, Tape, Vioxx [rofecoxib], and Zetia [ezetimibe]   Social History   Socioeconomic History   Marital status: Single    Spouse name: Not on file   Number of children: 0   Years of education: 8   Highest education level: Not on file  Occupational History   Occupation: Disabled  Tobacco Use   Smoking status: Every Day    Packs/day: 1.50    Types: Cigarettes   Smokeless tobacco: Former    Types: Nurse, children's Use: Former  Substance and Sexual Activity   Alcohol use: No   Drug use: No   Sexual activity: Not on file  Other Topics Concern   Not on file  Social  History Narrative   Patient lives at home with  mother. Patient has no children. Patient is single. Patient has a 8th grade education. Patient is disabled   Social Determinants of Radio broadcast assistant Strain: Not on file  Food Insecurity: Not on file  Transportation Needs: Not on file  Physical Activity: Not on file  Stress: Not on file  Social Connections: Not on file     Family History: The patient's family history includes Cancer in her father and mother; Cirrhosis in her sister; Diabetes in her father; Heart failure in her father; Hypertension in her father and mother; Rheum arthritis in her mother; Stroke in her mother; Thyroid disease in her mother. ROS:   Please see the history of present illness.  All 14 point review of systems negative except as described per history of present illness  EKGs/Labs/Other Studies Reviewed:      Recent Labs: 05/04/2021: ALT 6; BUN 10; Creatinine, Ser 1.13; Potassium 4.1; Sodium 137; TSH 0.893  Recent Lipid Panel    Component Value Date/Time   CHOL 201 (H) 05/04/2021 1528   TRIG 127 05/04/2021 1528   HDL 43 05/04/2021 1528   CHOLHDL 4.7 (H) 05/04/2021 1528   LDLCALC 135 (H) 05/04/2021 1528    Physical Exam:    VS:  BP 98/60 (BP Location: Left Arm, Patient Position: Sitting)   Pulse 92   Ht 5' (1.524 m)   Wt 106 lb 3.2 oz (48.2 kg)   SpO2 96%   BMI 20.74 kg/m     Wt Readings from Last 3 Encounters:  01/09/22 106 lb 3.2 oz (48.2 kg)  11/03/21 107 lb 3.2 oz (48.6 kg)  06/07/21 107 lb 12.8 oz (48.9 kg)     GEN:  Well nourished, well developed in no acute distress HEENT: Normal NECK: No JVD; No carotid bruits LYMPHATICS: No lymphadenopathy CARDIAC: RRR, no murmurs, no rubs, no gallops RESPIRATORY:  Clear to auscultation without rales, wheezing or rhonchi  ABDOMEN: Soft, non-tender, non-distended MUSCULOSKELETAL:  No edema; No deformity  SKIN: Warm and dry LOWER EXTREMITIES: no swelling NEUROLOGIC:  Alert and  oriented x 3 PSYCHIATRIC:  Normal affect   ASSESSMENT:    1. Essential hypertension   2. Gastroesophageal reflux disease, unspecified whether esophagitis present   3. Mixed hyperlipidemia   4. Preop cardiovascular exam    PLAN:    In order of problems listed above:  Essential hypertension blood pressure seems to be actually on the lower side.  We will continue monitoring and watching Dyslipidemia she is on PCSK9 agent which I will continue.  I will schedule her to have fasting lipid profile done Cardiovascular preop evaluation there is no need to do any additional testing before does low risk procedure that she is scheduled to have Smoking still ongoing I told her she must quit she understands she will try to do that. Peripheral vascular disease bilateral up to 60% stenosis in both internal carotid arteries.  We will repeat the test in October.   Medication Adjustments/Labs and Tests Ordered: Current medicines are reviewed at length with the patient today.  Concerns regarding medicines are outlined above.  No orders of the defined types were placed in this encounter.  Medication changes: No orders of the defined types were placed in this encounter.   Signed, Park Liter, MD, [facility] 01/09/2022 1:09 PM    [place]

## 2022-01-09 NOTE — Addendum Note (Signed)
Addended by: Jacobo Forest D on: 01/09/2022 01:19 PM   Modules accepted: Orders

## 2022-01-09 NOTE — Patient Instructions (Signed)
Medication Instructions:  Your physician recommends that you continue on your current medications as directed. Please refer to the Current Medication list given to you today.  *If you need a refill on your cardiac medications before your next appointment, please call your pharmacy*   Lab Work: Your physician recommends that you return for lab work in: When you are fasting You need to have labs done when you are fasting.  You can come Monday through Friday 8:30 am to 12:00 pm and 1:15 to 4:30. You do not need to make an appointment as the order has already been placed. The labs you are going to have done are AST, ALT and Lipids.    Testing/Procedures: For October 2023 Your physician has requested that you have a carotid duplex. This test is an ultrasound of the carotid arteries in your neck. It looks at blood flow through these arteries that supply the brain with blood. Allow one hour for this exam. There are no restrictions or special instructions.    Follow-Up: At [facility], you and your health needs are our priority.  As part of our continuing mission to provide you with exceptional heart care, we have created designated Provider Care Teams.  These Care Teams include your primary Cardiologist (physician) and Advanced Practice Providers (APPs -  Physician Assistants and Nurse Practitioners) who all work together to provide you with the care you need, when you need it.  We recommend signing up for the patient portal called "MyChart".  Sign up information is provided on this After Visit Summary.  MyChart is used to connect with patients for Virtual Visits (Telemedicine).  Patients are able to view lab/test results, encounter notes, upcoming appointments, etc.  Non-urgent messages can be sent to your provider as well.   To learn more about what you can do with MyChart, go to [email or web address].    Your next appointment:   6 month(s)  The format for your next appointment:   In  Person  Provider:   Jenne Campus, MD    Other Instructions NA

## 2022-01-10 ENCOUNTER — Telehealth: Payer: Self-pay

## 2022-01-10 DIAGNOSIS — E782 Mixed hyperlipidemia: Secondary | ICD-10-CM | POA: Diagnosis not present

## 2022-01-10 NOTE — Telephone Encounter (Signed)
   [facility] Pre-operative Risk Assessment    Request for surgical clearance:  What type of surgery is being performed? Blepharoplasty bilateral upper lids w/ fat pad, brow ptosis is repair by pexy-both eyes    When is this surgery scheduled? TBD   What type of clearance is required (medical clearance vs. Pharmacy clearance to hold med vs. Both)? Both  Are there any medications that need to be held prior to surgery and how long?Not specified    Practice name and name of physician performing surgery? Dr. Hollice Espy at [facility]    What is your office phone number: [phone] ext [phone]    7.   What is your office fax number: [phone]  8.   Anesthesia type (None, local, MAC, general) ? IV sedation   Basil Dess Caitriona Sundquist 01/10/2022, 5:04 PM  _________________________________________________________________   (provider comments below)

## 2022-01-11 LAB — LIPID PANEL
Chol/HDL Ratio: 2.8 ratio (ref 0.0–4.4)
Cholesterol, Total: 118 mg/dL (ref 100–199)
HDL: 42 mg/dL (ref >39)
LDL Chol Calc (NIH): 47 mg/dL (ref 0–99)
Triglycerides: 179 mg/dL — ABNORMAL HIGH (ref 0–149)
VLDL Cholesterol Cal: 29 mg/dL (ref 5–40)

## 2022-01-11 LAB — AST: AST: 13 IU/L (ref 0–40)

## 2022-01-11 LAB — ALT: ALT: 6 IU/L (ref 0–32)

## 2022-01-11 NOTE — Telephone Encounter (Signed)
   Patient Name: Crystal Lyons  DOB: [birth year] MRN: [id number]  Primary Cardiologist: Jenne Campus, MD  Chart reviewed as part of pre-operative protocol coverage. Given past medical history and time since last visit, based on ACC/AHA guidelines, NORLEEN XIE would be at acceptable risk for the planned procedure without further cardiovascular testing.   Patient was recently seen by Dr. Agustin Cree in the office at which time she was cleared for the upcoming procedure.  I will route this recommendation to the requesting party via Epic fax function and remove from pre-op pool.  Please call with questions.  [place], [place] 01/11/2022, 9:45 PM

## 2022-01-11 NOTE — Telephone Encounter (Signed)
   Patient Name: Crystal Lyons  DOB: September 22, 1965 MRN: [id number]  Primary Cardiologist: Jenne Campus, MD  Chart reviewed as part of pre-operative protocol coverage. Given past medical history and time since last visit, based on ACC/AHA guidelines, ALEXYA MCDARIS would be at acceptable risk for the planned procedure without further cardiovascular testing.   Patient was seen by Dr. Agustin Cree during a recent office visit, she was cleared for dental procedure.  She does not need SBE prophylaxis.  I will route this recommendation to the requesting party via Epic fax function and remove from pre-op pool.  Please call with questions.  Van, [place] 01/11/2022, 9:47 PM

## 2022-01-12 ENCOUNTER — Telehealth: Payer: Self-pay

## 2022-01-12 DIAGNOSIS — M5030 Other cervical disc degeneration, unspecified cervical region: Secondary | ICD-10-CM | POA: Diagnosis not present

## 2022-01-12 DIAGNOSIS — M5137 Other intervertebral disc degeneration, lumbosacral region: Secondary | ICD-10-CM | POA: Diagnosis not present

## 2022-01-12 DIAGNOSIS — G894 Chronic pain syndrome: Secondary | ICD-10-CM | POA: Diagnosis not present

## 2022-01-12 NOTE — Telephone Encounter (Signed)
Results reviewed with pt as per Dr. Krasowski's note.  Pt verbalized understanding and had no additional questions. Routed to PCP  

## 2022-01-13 DIAGNOSIS — Z01818 Encounter for other preprocedural examination: Secondary | ICD-10-CM | POA: Diagnosis not present

## 2022-01-13 DIAGNOSIS — H02831 Dermatochalasis of right upper eyelid: Secondary | ICD-10-CM | POA: Diagnosis not present

## 2022-01-13 DIAGNOSIS — H02834 Dermatochalasis of left upper eyelid: Secondary | ICD-10-CM | POA: Diagnosis not present

## 2022-01-13 DIAGNOSIS — H57813 Brow ptosis, bilateral: Secondary | ICD-10-CM | POA: Diagnosis not present

## 2022-01-19 NOTE — Telephone Encounter (Signed)
[employer] requesting to speak to [employer]. Transferred.

## 2022-01-19 NOTE — Telephone Encounter (Signed)
Pt has appt tomorrow with Dr. Agustin Cree 01/20/22, see previous notes.

## 2022-01-19 NOTE — Telephone Encounter (Signed)
Crystal Lyons, PAC called from [facility] to update the anesthesia has been changed. Per Anesthesiologist, they will be using GENERAL anesthesia instead of IV SEDATION. Procedure is tomorrow 01/20/22 per PAC.   PAC wanted to clear with cardiology if the change in anesthesia is going to change anything with the clearance recommendations.   I assured Crystal Lyons, PAC that I will forward to our pre op team to review if anything needs to be changed on our end of the clearance, now that the type of anesthesia is changed to GENERAL.   I assured PAC that I will call her back and let her now if anything has changed. PAC thanked me for our help.

## 2022-01-19 NOTE — Telephone Encounter (Signed)
I have left a message for Rinaldo Cloud, NP, pt will require an appt in office as she has stated to pre op app today still having stabbing chest pain with and with out ambulation. I will send a message to [facility] scheduling to see if they can get the pt in soon, based on her symptoms.  I will fax notes as FYI to requesting office as well

## 2022-01-19 NOTE — Telephone Encounter (Signed)
    Primary Cardiologist:Robert Agustin Cree, MD  Chart reviewed as part of pre-operative protocol coverage. Because of Crystal Lyons's past medical history and current chest pain complaint she will require a follow-up visit in order to better assess preoperative cardiovascular risk.  Patient was contacted over the phone and stated that she is continue to have sharp stabbing chest pain that occurs with and without ambulation.  Pre-op covering staff:  - Please contact requesting surgeon's office via preferred method (i.e, phone, fax) to inform them of need for appointment prior to surgery.  Please schedule in person follow-up appointment with primary cardiology office for further testing and inpatient clearance.    Mable Fill, Marissa Nestle, NP  01/19/2022, 10:01 AM

## 2022-01-20 ENCOUNTER — Ambulatory Visit: Payer: Medicare HMO | Admitting: Cardiology

## 2022-01-20 ENCOUNTER — Encounter: Payer: Self-pay | Admitting: Cardiology

## 2022-01-20 VITALS — BP 100/60 | HR 83 | Ht 60.0 in | Wt 106.6 lb

## 2022-01-20 DIAGNOSIS — R0789 Other chest pain: Secondary | ICD-10-CM | POA: Diagnosis not present

## 2022-01-20 DIAGNOSIS — E785 Hyperlipidemia, unspecified: Secondary | ICD-10-CM

## 2022-01-20 DIAGNOSIS — K219 Gastro-esophageal reflux disease without esophagitis: Secondary | ICD-10-CM

## 2022-01-20 DIAGNOSIS — I10 Essential (primary) hypertension: Secondary | ICD-10-CM | POA: Diagnosis not present

## 2022-01-20 NOTE — Patient Instructions (Signed)
Medication Instructions:  Your physician recommends that you continue on your current medications as directed. Please refer to the Current Medication list given to you today.  *If you need a refill on your cardiac medications before your next appointment, please call your pharmacy*   Lab Work: None Ordered If you have labs (blood work) drawn today and your tests are completely normal, you will receive your results only by: [place] (if you have MyChart) OR A paper copy in the mail If you have any lab test that is abnormal or we need to change your treatment, we will call you to review the results.   Testing/Procedures: Your physician has requested that you have a lexiscan myoview. For further information please visit [email or web address]. Please follow instruction sheet, as given.  The test will take approximately 3 to 4 hours to complete; you may bring reading material.  If someone comes with you to your appointment, they will need to remain in the main lobby due to limited space in the testing area. **If you are pregnant or breastfeeding, please notify the nuclear lab prior to your appointment**  How to prepare for your Myocardial Perfusion Test: Do not eat or drink 3 hours prior to your test, except you may have water. Do not consume products containing caffeine (regular or decaffeinated) 12 hours prior to your test. (ex: coffee, chocolate, sodas, tea). Do bring a list of your current medications with you.  If not listed below, you may take your medications as normal. Do wear comfortable clothes (no dresses or overalls) and walking shoes, tennis shoes preferred (No heels or open toe shoes are allowed). Do NOT wear cologne, perfume, aftershave, or lotions (deodorant is allowed). If these instructions are not followed, your test will have to be rescheduled.     Follow-Up: At [facility], you and your health needs are our priority.  As part of our continuing mission to provide  you with exceptional heart care, we have created designated Provider Care Teams.  These Care Teams include your primary Cardiologist (physician) and Advanced Practice Providers (APPs -  Physician Assistants and Nurse Practitioners) who all work together to provide you with the care you need, when you need it.  We recommend signing up for the patient portal called "MyChart".  Sign up information is provided on this After Visit Summary.  MyChart is used to connect with patients for Virtual Visits (Telemedicine).  Patients are able to view lab/test results, encounter notes, upcoming appointments, etc.  Non-urgent messages can be sent to your provider as well.   To learn more about what you can do with MyChart, go to [email or web address].    Your next appointment:    As Scheduled  The format for your next appointment:   In Person  Provider:   Jenne Campus, MD    Other Instructions NA

## 2022-01-20 NOTE — Progress Notes (Signed)
Cardiology Office Note:    Date:  01/20/2022   ID:  FINN ALTEMOSE, DOB December 28, 1965, MRN [id number]  PCP:  Street, Sharon Mt, MD  Cardiologist:  Jenne Campus, MD    Referring MD: Street, Sharon Mt, *   Chief Complaint  Patient presents with   Follow-up  I need surgery and surgery was canceled because of cardiac concerns  History of Present Illness:    Crystal Lyons is a 56 y.o. female with past medical history significant for chronic smoking, carotic artery stenosis, dyslipidemia, in April of last year she had coronary CT angio done which showed calcium score 0 and normal coronaries.  She was scheduled to have some plastic eye surgery however after conversation with anesthesia the surgery being canceled because of episode of chest pain.  She still complaining of having chest pain pain happen usually at rest sometimes followed with exercise.  This is similar symptoms she had previously.  However she said that she would not have surgery unless stress test will be done.  Past Medical History:  Diagnosis Date   Anxiety    Anxiety disorder 08/19/2013   Atypical chest pain    Barrett esophagus    Brachial neuritis 11/23/2012   Carpal tunnel syndrome    right and left wrists   Cervical post-laminectomy syndrome 08/19/2013   Chronic headache disorder 05/08/2016   Chronic neck pain 03/15/2015   DDD (degenerative disc disease), lumbosacral 08/19/2013   Disorder of bone and cartilage 04/21/2013   Dyslipidemia 01/21/2015   Dysrhythmia    Encounter for therapeutic drug monitoring 05/01/2019   Epilepsy ([place]) 02/24/2014   Essential hypertension 11/20/2017   GERD (gastroesophageal reflux disease)    Headache 03/09/2014   Headache(784.0) 03/09/2014   Heart murmur    Hiatal hernia    Hypercholesterolemia 11/23/2012   Hyperlipidemia    Hypertension    Low back pain 11/23/2012   Lumbar radicular pain 04/21/2013   Myalgia and myositis 08/19/2013   Osteoarthritis of spine with radiculopathy, cervical  region 03/15/2015   Osteopenia    Seizure disorder ([place]) 08/19/2013   Seizures ([place]) 09/27/2012   Shingles    Sinus tachycardia 08/16/2018   Sleep apnea    Sleep disorder 09/27/2012   Smoking 11/20/2017   Vitamin D deficiency 02/24/2014    Past Surgical History:  Procedure Laterality Date   BRAIN SURGERY  01/06/1985   BRAIN SURGERY Right 1986   "right temporal lobe"   BREAST BIOPSY Right 12/04/2005   CHOLECYSTECTOMY     CYST REMOVAL TRUNK     ears     EYE SURGERY     cyst removed   GALLBLADDER SURGERY N/A 2009   NECK SURGERY     SHOULDER SURGERY Bilateral 2009, 2010   UPPER GI ENDOSCOPY  10/2020   VULVECTOMY N/A 07/04/2018   Procedure: WIDE EXCISION VULVECTOMY;  Surgeon: Vanessa Kick, MD;  Location: [facility];  Service: Gynecology;  Laterality: N/A;    Current Medications: Current Meds  Medication Sig   albuterol (PROVENTIL HFA;VENTOLIN HFA) 108 (90 Base) MCG/ACT inhaler Inhale 2 puffs into the lungs every 6 (six) hours as needed for wheezing or shortness of breath.   ALPRAZolam (XANAX) 0.5 MG tablet Take 0.5 mg by mouth 3 (three) times daily as needed for anxiety.   amitriptyline (ELAVIL) 10 MG tablet Take 10 mg by mouth See admin instructions. 1 tab qhs for 1 week and then 2 tabs qhs   clobetasol ointment (TEMOVATE) 1.93 % Apply 1 application topically  2 (two) times daily.   denosumab (PROLIA) 60 MG/ML SOSY injection Inject 60 mg into the skin every 6 (six) months.   dicyclomine (BENTYL) 10 MG capsule Take 10 mg by mouth every 6 (six) hours as needed for spasms. For abdominal pain   Evolocumab (REPATHA SURECLICK) 756 MG/ML SOAJ Inject 140 mg into the skin every 14 (fourteen) days.   ezetimibe (ZETIA) 10 MG tablet Take 1 tablet (10 mg total) by mouth daily.   famotidine (PEPCID) 40 MG tablet Take 40 mg by mouth daily.   Fexofenadine-Pseudoephedrine (ALLEGRA-D 12 HOUR PO) Take 1 tablet by mouth every other day.   HYDROcodone-acetaminophen (NORCO/VICODIN) 5-325 MG per tablet Take 1  tablet by mouth as needed for moderate pain or severe pain (Up tp 4 times a day).   lansoprazole (PREVACID) 30 MG capsule Take 30 mg by mouth daily.    neomycin-bacitracin-polymyxin [phone] OINT Apply 1 application. topically 3 (three) times daily. 3.5   nitroGLYCERIN (NITROSTAT) 0.4 MG SL tablet Place 1 tablet (0.4 mg total) under the tongue every 5 (five) minutes as needed for chest pain.   omeprazole (PRILOSEC) 20 MG capsule Take 40 mg by mouth as needed (indigestion).   promethazine (PHENERGAN) 12.5 MG tablet Take 12.5 mg by mouth every 8 (eight) hours as needed for nausea or vomiting.    spironolactone-hydrochlorothiazide (ALDACTAZIDE) 25-25 MG tablet Take 1 tablet by mouth daily.   tiZANidine (ZANAFLEX) 4 MG capsule Take 4 mg by mouth 3 (three) times daily.   VIVELLE-DOT 0.05 MG/24HR Place 1 patch onto the skin 2 (two) times a week.      Allergies:   Adenosine, Azithromycin, Aleve [naproxen sodium], Atorvastatin, Clonazepam, Clozapine, Crestor [rosuvastatin], Erythromycin base, Feldene [piroxicam], Keflex [cephalexin], Mobic [meloxicam], Neurontin [gabapentin], Other, Penicillins, Simvastatin, Tape, Vioxx [rofecoxib], and Zetia [ezetimibe]   Social History   Socioeconomic History   Marital status: Single    Spouse name: Not on file   Number of children: 0   Years of education: 8   Highest education level: Not on file  Occupational History   Occupation: Disabled  Tobacco Use   Smoking status: Every Day    Packs/day: 1.50    Types: Cigarettes   Smokeless tobacco: Former    Types: Nurse, children's Use: Former  Substance and Sexual Activity   Alcohol use: No   Drug use: No   Sexual activity: Not on file  Other Topics Concern   Not on file  Social History Narrative   Patient lives at home with  mother. Patient has no children. Patient is single. Patient has a 8th grade education. Patient is disabled   Social Determinants of Radio broadcast assistant Strain:  Not on file  Food Insecurity: Not on file  Transportation Needs: Not on file  Physical Activity: Not on file  Stress: Not on file  Social Connections: Not on file     Family History: The patient's family history includes Cancer in her father and mother; Cirrhosis in her sister; Diabetes in her father; Heart failure in her father; Hypertension in her father and mother; Rheum arthritis in her mother; Stroke in her mother; Thyroid disease in her mother. ROS:   Please see the history of present illness.    All 14 point review of systems negative except as described per history of present illness  EKGs/Labs/Other Studies Reviewed:      Recent Labs: 05/04/2021: BUN 10; Creatinine, Ser 1.13; Potassium 4.1; Sodium 137; TSH 0.893 01/10/2022:  ALT 6  Recent Lipid Panel    Component Value Date/Time   CHOL 118 01/10/2022 1059   TRIG 179 (H) 01/10/2022 1059   HDL 42 01/10/2022 1059   CHOLHDL 2.8 01/10/2022 1059   LDLCALC 47 01/10/2022 1059    Physical Exam:    VS:  BP 100/60 (BP Location: Left Arm, Patient Position: Sitting)   Pulse 83   Ht 5' (1.524 m)   Wt 106 lb 9.6 oz (48.4 kg)   SpO2 99%   BMI 20.82 kg/m     Wt Readings from Last 3 Encounters:  01/20/22 106 lb 9.6 oz (48.4 kg)  01/09/22 106 lb 3.2 oz (48.2 kg)  11/03/21 107 lb 3.2 oz (48.6 kg)     GEN:  Well nourished, well developed in no acute distress HEENT: Normal NECK: No JVD; No carotid bruits LYMPHATICS: No lymphadenopathy CARDIAC: RRR, no murmurs, no rubs, no gallops RESPIRATORY:  Clear to auscultation without rales, wheezing or rhonchi  ABDOMEN: Soft, non-tender, non-distended MUSCULOSKELETAL:  No edema; No deformity  SKIN: Warm and dry LOWER EXTREMITIES: no swelling NEUROLOGIC:  Alert and oriented x 3 PSYCHIATRIC:  Normal affect   ASSESSMENT:    1. Atypical chest pain   2. Dyslipidemia   3. Essential hypertension   4. Gastroesophageal reflux disease, unspecified whether esophagitis present    PLAN:     In order of problems listed above:  Atypical chest pain doubt very much it is related to her heart, she did have coronary CT angio last year which was normal.  She still however have continued smoking therefore I will schedule her to have Lexiscan Dyslipidemia, continue with present medications which include Zetia since she cannot tolerate statin.  She is also taking PCSK9 agent last LDL was 47 HDL 42 this data is from [facility].  Good cholesterol control Essential hypertension blood pressure seems well controlled today we will continue present management. Gastroesophageal reflux disease.  She is scheduled to have gastroscopy done by gastroenterologist   Medication Adjustments/Labs and Tests Ordered: Current medicines are reviewed at length with the patient today.  Concerns regarding medicines are outlined above.  No orders of the defined types were placed in this encounter.  Medication changes: No orders of the defined types were placed in this encounter.   Signed, Park Liter, MD, [facility] 01/20/2022 11:42 AM    [place]

## 2022-01-24 ENCOUNTER — Telehealth: Payer: Self-pay | Admitting: *Deleted

## 2022-01-24 NOTE — Telephone Encounter (Signed)
Patient given detailed instructions per Myocardial Perfusion Study Information Sheet for the test on 01/26/22 at 1115. Patient notified to arrive 15 minutes early and that it is imperative to arrive on time for appointment to keep from having the test rescheduled.  If you need to cancel or reschedule your appointment, please call the office within 24 hours of your appointment. . Patient verbalized understanding.Crystal Lyons, [place]

## 2022-01-26 ENCOUNTER — Ambulatory Visit (INDEPENDENT_AMBULATORY_CARE_PROVIDER_SITE_OTHER): Payer: Medicare HMO

## 2022-01-26 DIAGNOSIS — R0789 Other chest pain: Secondary | ICD-10-CM | POA: Diagnosis not present

## 2022-01-26 DIAGNOSIS — E785 Hyperlipidemia, unspecified: Secondary | ICD-10-CM

## 2022-01-26 LAB — MYOCARDIAL PERFUSION IMAGING
LV dias vol: 36 mL (ref 46–106)
LV sys vol: 12 mL
Nuc Stress EF: 68 %
Peak HR: 126 {beats}/min
Rest HR: 77 {beats}/min
Rest Nuclear Isotope Dose: 10.4 mCi
SDS: 0
SRS: 0
SSS: 0
ST Depression (mm): 0 mm
Stress Nuclear Isotope Dose: 31.6 mCi
TID: 1.35

## 2022-01-26 MED ORDER — REGADENOSON 0.4 MG/5ML IV SOLN
0.4000 mg | Freq: Once | INTRAVENOUS | Status: AC
  Administered 2022-01-26: 0.4 mg via INTRAVENOUS

## 2022-01-26 MED ORDER — TECHNETIUM TC 99M TETROFOSMIN IV KIT
10.4000 | PACK | Freq: Once | INTRAVENOUS | Status: AC | PRN
  Administered 2022-01-26: 10.4 via INTRAVENOUS

## 2022-01-26 MED ORDER — TECHNETIUM TC 99M TETROFOSMIN IV KIT
31.6000 | PACK | Freq: Once | INTRAVENOUS | Status: AC | PRN
  Administered 2022-01-26: 31.6 via INTRAVENOUS

## 2022-02-07 ENCOUNTER — Ambulatory Visit: Payer: Medicare HMO | Admitting: Cardiology

## 2022-02-16 DIAGNOSIS — I10 Essential (primary) hypertension: Secondary | ICD-10-CM | POA: Diagnosis not present

## 2022-02-16 DIAGNOSIS — E785 Hyperlipidemia, unspecified: Secondary | ICD-10-CM | POA: Diagnosis not present

## 2022-03-06 DIAGNOSIS — K649 Unspecified hemorrhoids: Secondary | ICD-10-CM | POA: Diagnosis not present

## 2022-03-06 DIAGNOSIS — K5900 Constipation, unspecified: Secondary | ICD-10-CM | POA: Diagnosis not present

## 2022-03-06 DIAGNOSIS — K219 Gastro-esophageal reflux disease without esophagitis: Secondary | ICD-10-CM | POA: Diagnosis not present

## 2022-03-21 DIAGNOSIS — G720 Drug-induced myopathy: Secondary | ICD-10-CM | POA: Diagnosis not present

## 2022-03-21 DIAGNOSIS — Z23 Encounter for immunization: Secondary | ICD-10-CM | POA: Diagnosis not present

## 2022-03-21 DIAGNOSIS — R7301 Impaired fasting glucose: Secondary | ICD-10-CM | POA: Diagnosis not present

## 2022-03-21 DIAGNOSIS — N1831 Chronic kidney disease, stage 3a: Secondary | ICD-10-CM | POA: Diagnosis not present

## 2022-03-21 DIAGNOSIS — Z0000 Encounter for general adult medical examination without abnormal findings: Secondary | ICD-10-CM | POA: Diagnosis not present

## 2022-03-21 DIAGNOSIS — E782 Mixed hyperlipidemia: Secondary | ICD-10-CM | POA: Diagnosis not present

## 2022-03-21 DIAGNOSIS — K2960 Other gastritis without bleeding: Secondary | ICD-10-CM | POA: Diagnosis not present

## 2022-03-21 DIAGNOSIS — I10 Essential (primary) hypertension: Secondary | ICD-10-CM | POA: Diagnosis not present

## 2022-03-21 DIAGNOSIS — M75102 Unspecified rotator cuff tear or rupture of left shoulder, not specified as traumatic: Secondary | ICD-10-CM | POA: Diagnosis not present

## 2022-04-13 DIAGNOSIS — M5030 Other cervical disc degeneration, unspecified cervical region: Secondary | ICD-10-CM | POA: Diagnosis not present

## 2022-04-13 DIAGNOSIS — G894 Chronic pain syndrome: Secondary | ICD-10-CM | POA: Diagnosis not present

## 2022-04-13 DIAGNOSIS — M961 Postlaminectomy syndrome, not elsewhere classified: Secondary | ICD-10-CM | POA: Diagnosis not present

## 2022-04-13 DIAGNOSIS — Z79899 Other long term (current) drug therapy: Secondary | ICD-10-CM | POA: Diagnosis not present

## 2022-04-13 DIAGNOSIS — M5137 Other intervertebral disc degeneration, lumbosacral region: Secondary | ICD-10-CM | POA: Diagnosis not present

## 2022-04-18 ENCOUNTER — Ambulatory Visit: Payer: Medicare HMO | Attending: Cardiology

## 2022-04-18 DIAGNOSIS — H5203 Hypermetropia, bilateral: Secondary | ICD-10-CM | POA: Diagnosis not present

## 2022-04-18 DIAGNOSIS — E782 Mixed hyperlipidemia: Secondary | ICD-10-CM | POA: Diagnosis not present

## 2022-04-18 DIAGNOSIS — I6521 Occlusion and stenosis of right carotid artery: Secondary | ICD-10-CM | POA: Diagnosis not present

## 2022-04-24 ENCOUNTER — Telehealth: Payer: Self-pay

## 2022-04-24 NOTE — Telephone Encounter (Signed)
-----   Message from Park Liter, MD sent at 04/19/2022  5:08 PM EDT ----- Stenosis up to 59% on the right side of her neck.  This is still for medical therapy

## 2022-04-24 NOTE — Telephone Encounter (Signed)
Patient notified of results.

## 2022-05-16 DIAGNOSIS — H02831 Dermatochalasis of right upper eyelid: Secondary | ICD-10-CM | POA: Diagnosis not present

## 2022-05-16 DIAGNOSIS — H02834 Dermatochalasis of left upper eyelid: Secondary | ICD-10-CM | POA: Diagnosis not present

## 2022-05-22 ENCOUNTER — Encounter: Payer: Self-pay | Admitting: Obstetrics and Gynecology

## 2022-05-22 DIAGNOSIS — Z1231 Encounter for screening mammogram for malignant neoplasm of breast: Secondary | ICD-10-CM

## 2022-05-23 DIAGNOSIS — G720 Drug-induced myopathy: Secondary | ICD-10-CM | POA: Diagnosis not present

## 2022-05-23 DIAGNOSIS — I700 Atherosclerosis of aorta: Secondary | ICD-10-CM | POA: Diagnosis not present

## 2022-05-23 DIAGNOSIS — K5909 Other constipation: Secondary | ICD-10-CM | POA: Diagnosis not present

## 2022-05-23 DIAGNOSIS — R1031 Right lower quadrant pain: Secondary | ICD-10-CM | POA: Diagnosis not present

## 2022-06-01 DIAGNOSIS — R1031 Right lower quadrant pain: Secondary | ICD-10-CM | POA: Diagnosis not present

## 2022-06-01 DIAGNOSIS — R109 Unspecified abdominal pain: Secondary | ICD-10-CM | POA: Diagnosis not present

## 2022-06-05 DIAGNOSIS — K648 Other hemorrhoids: Secondary | ICD-10-CM | POA: Diagnosis not present

## 2022-06-05 DIAGNOSIS — K219 Gastro-esophageal reflux disease without esophagitis: Secondary | ICD-10-CM | POA: Diagnosis not present

## 2022-06-05 DIAGNOSIS — R634 Abnormal weight loss: Secondary | ICD-10-CM | POA: Diagnosis not present

## 2022-06-05 DIAGNOSIS — K259 Gastric ulcer, unspecified as acute or chronic, without hemorrhage or perforation: Secondary | ICD-10-CM | POA: Diagnosis not present

## 2022-06-05 DIAGNOSIS — R1030 Lower abdominal pain, unspecified: Secondary | ICD-10-CM | POA: Diagnosis not present

## 2022-06-05 DIAGNOSIS — K5900 Constipation, unspecified: Secondary | ICD-10-CM | POA: Diagnosis not present

## 2022-06-18 ENCOUNTER — Other Ambulatory Visit: Payer: Self-pay | Admitting: Cardiology

## 2022-06-28 DIAGNOSIS — Z0100 Encounter for examination of eyes and vision without abnormal findings: Secondary | ICD-10-CM | POA: Diagnosis not present

## 2022-06-29 ENCOUNTER — Telehealth: Payer: Self-pay | Admitting: Cardiology

## 2022-06-29 DIAGNOSIS — K259 Gastric ulcer, unspecified as acute or chronic, without hemorrhage or perforation: Secondary | ICD-10-CM | POA: Diagnosis not present

## 2022-06-29 DIAGNOSIS — F17210 Nicotine dependence, cigarettes, uncomplicated: Secondary | ICD-10-CM | POA: Diagnosis not present

## 2022-06-29 DIAGNOSIS — K449 Diaphragmatic hernia without obstruction or gangrene: Secondary | ICD-10-CM | POA: Diagnosis not present

## 2022-06-29 DIAGNOSIS — K2270 Barrett's esophagus without dysplasia: Secondary | ICD-10-CM | POA: Diagnosis not present

## 2022-06-29 DIAGNOSIS — K222 Esophageal obstruction: Secondary | ICD-10-CM | POA: Diagnosis not present

## 2022-06-29 NOTE — Telephone Encounter (Signed)
Calling to get a copy of patient last office visit as well as the stress test results. Fax [phone]. Please advise

## 2022-06-29 NOTE — Telephone Encounter (Signed)
Records sent as requested to [id number]- Tillie Rung

## 2022-07-05 ENCOUNTER — Other Ambulatory Visit (HOSPITAL_COMMUNITY): Payer: Self-pay

## 2022-07-07 ENCOUNTER — Other Ambulatory Visit: Payer: Self-pay | Admitting: Obstetrics and Gynecology

## 2022-07-07 DIAGNOSIS — N6320 Unspecified lump in the left breast, unspecified quadrant: Secondary | ICD-10-CM

## 2022-07-11 ENCOUNTER — Ambulatory Visit: Payer: Medicare HMO | Admitting: Cardiology

## 2022-07-13 DIAGNOSIS — M4722 Other spondylosis with radiculopathy, cervical region: Secondary | ICD-10-CM | POA: Diagnosis not present

## 2022-07-13 DIAGNOSIS — G894 Chronic pain syndrome: Secondary | ICD-10-CM | POA: Diagnosis not present

## 2022-07-13 DIAGNOSIS — M961 Postlaminectomy syndrome, not elsewhere classified: Secondary | ICD-10-CM | POA: Diagnosis not present

## 2022-07-13 DIAGNOSIS — M5137 Other intervertebral disc degeneration, lumbosacral region: Secondary | ICD-10-CM | POA: Diagnosis not present

## 2022-07-14 ENCOUNTER — Ambulatory Visit: Payer: Medicare HMO | Attending: Cardiology | Admitting: Cardiology

## 2022-07-14 ENCOUNTER — Encounter: Payer: Self-pay | Admitting: Cardiology

## 2022-07-14 VITALS — BP 122/76 | HR 91 | Ht 60.0 in | Wt 108.4 lb

## 2022-07-14 DIAGNOSIS — I739 Peripheral vascular disease, unspecified: Secondary | ICD-10-CM | POA: Diagnosis not present

## 2022-07-14 DIAGNOSIS — E785 Hyperlipidemia, unspecified: Secondary | ICD-10-CM | POA: Diagnosis not present

## 2022-07-14 DIAGNOSIS — I10 Essential (primary) hypertension: Secondary | ICD-10-CM

## 2022-07-14 DIAGNOSIS — K219 Gastro-esophageal reflux disease without esophagitis: Secondary | ICD-10-CM

## 2022-07-14 HISTORY — DX: Peripheral vascular disease, unspecified: I73.9

## 2022-07-14 NOTE — Patient Instructions (Signed)

## 2022-07-14 NOTE — Progress Notes (Unsigned)
Cardiology Office Note:    Date:  07/14/2022   ID:  Crystal Lyons, DOB January 18, 1966, MRN [id number]  PCP:  Street, Sharon Mt, MD  Cardiologist:  Jenne Campus, MD    Referring MD: Street, Sharon Mt, *   Chief Complaint  Patient presents with   Medication Management    History of Present Illness:    Crystal Lyons is a 57 y.o. female with past medical history significant for chronic smoking which is still ongoing, coronary arterial disease with up to 59% stenosis, dyslipidemia, in April of last year she did have coronary CT angiogram which showed no coronary artery disease normal coronaries with calcium score 0 which is Surprising but Very Encouraging.  She Is Coming Today to Months for Follow-Up She Did Have GI Procedure Done She Was Found to Have Some Ulcers.  She Was Given Appropriate Medication Doing Better from That Point Review.  Still Have Active Concerns A Lot Of Complaints.  Past Medical History:  Diagnosis Date   Anxiety    Anxiety disorder 08/19/2013   Atypical chest pain    Barrett esophagus    Brachial neuritis 11/23/2012   Carpal tunnel syndrome    right and left wrists   Cervical post-laminectomy syndrome 08/19/2013   Chronic headache disorder 05/08/2016   Chronic neck pain 03/15/2015   DDD (degenerative disc disease), lumbosacral 08/19/2013   Disorder of bone and cartilage 04/21/2013   Dyslipidemia 01/21/2015   Dysrhythmia    Encounter for therapeutic drug monitoring 05/01/2019   Epilepsy ([place]) 02/24/2014   Essential hypertension 11/20/2017   GERD (gastroesophageal reflux disease)    Headache 03/09/2014   Headache(784.0) 03/09/2014   Heart murmur    Hiatal hernia    Hypercholesterolemia 11/23/2012   Hyperlipidemia    Hypertension    Low back pain 11/23/2012   Lumbar radicular pain 04/21/2013   Myalgia and myositis 08/19/2013   Osteoarthritis of spine with radiculopathy, 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/15/2015   Osteopenia    Seizure disorder ([place]) 08/19/2013   Seizures ([place])  09/27/2012   Shingles    Sinus tachycardia 08/16/2018   Sleep apnea    Sleep disorder 09/27/2012   Smoking 11/20/2017   Vitamin D deficiency 02/24/2014    Past Surgical History:  Procedure Laterality Date   BRAIN SURGERY  01/06/1985   BRAIN SURGERY Right 1986   "right temporal lobe"   BREAST BIOPSY Right 12/04/2005   CHOLECYSTECTOMY     CYST REMOVAL TRUNK     ears     EYE SURGERY     cyst removed   GALLBLADDER SURGERY N/A 2009   NECK SURGERY     SHOULDER SURGERY Bilateral 2009, 2010   UPPER GI ENDOSCOPY  10/2020   VULVECTOMY N/A 07/04/2018   Procedure: WIDE EXCISION VULVECTOMY;  Surgeon: Vanessa Kick, MD;  Location: [facility];  Service: Gynecology;  Laterality: N/A;    Current Medications: Current Meds  Medication Sig   albuterol (PROVENTIL HFA;VENTOLIN HFA) 108 (90 Base) MCG/ACT inhaler Inhale 2 puffs into the lungs every 6 (six) hours as needed for wheezing or shortness of breath.   ALPRAZolam (XANAX) 0.5 MG tablet Take 0.5 mg by mouth 3 (three) times daily as needed for anxiety.   clobetasol ointment (TEMOVATE) 2.62 % Apply 1 application topically 2 (two) times daily.   denosumab (PROLIA) 60 MG/ML SOSY injection Inject 60 mg into the skin every 6 (six) months.   dicyclomine (BENTYL) 10 MG capsule Take 10 mg by mouth every 6 (six) hours as  needed for spasms. For abdominal pain   Evolocumab (REPATHA SURECLICK) 157 MG/ML SOAJ INJECT 140 MG INTO THE SKIN EVERY 14 (FOURTEEN) DAYS.   famotidine (PEPCID) 20 MG tablet Take 40 mg by mouth daily.   Fexofenadine-Pseudoephedrine (ALLEGRA-D 12 HOUR PO) Take 1 tablet by mouth every other day.   HYDROcodone-acetaminophen (NORCO/VICODIN) 5-325 MG per tablet Take 1 tablet by mouth as needed for moderate pain or severe pain (Up tp 4 times a day).   lansoprazole (PREVACID) 30 MG capsule Take 30 mg by mouth daily.    linaclotide (LINZESS) 145 MCG CAPS capsule Take 145 mcg by mouth daily before breakfast.   neomycin-bacitracin-polymyxin [phone]  OINT Apply 1 application. topically 3 (three) times daily. 3.5   nitroGLYCERIN (NITROSTAT) 0.4 MG SL tablet Place 1 tablet (0.4 mg total) under the tongue every 5 (five) minutes as needed for chest pain.   omeprazole (PRILOSEC) 20 MG capsule Take 40 mg by mouth as needed (indigestion).   promethazine (PHENERGAN) 25 MG tablet Take 12.5 mg by mouth every 8 (eight) hours as needed for nausea or vomiting.   spironolactone-hydrochlorothiazide (ALDACTAZIDE) 25-25 MG tablet Take 1 tablet by mouth daily.   tiotropium (SPIRIVA) 18 MCG inhalation capsule Place 18 mcg into inhaler and inhale as needed (sob or wheezing).   tiZANidine (ZANAFLEX) 4 MG capsule Take 4 mg by mouth 3 (three) times daily.   VIVELLE-DOT 0.05 MG/24HR Place 1 patch onto the skin 2 (two) times a week.    [DISCONTINUED] amitriptyline (ELAVIL) 10 MG tablet Take 10 mg by mouth See admin instructions. 1 tab qhs for 1 week and then 2 tabs qhs   [DISCONTINUED] ezetimibe (ZETIA) 10 MG tablet Take 1 tablet (10 mg total) by mouth daily.     Allergies:   Adenosine, Azithromycin, Alendronate, Aleve [naproxen sodium], Atorvastatin, Celecoxib, Clonazepam, Clozapine, Crestor [rosuvastatin], Erythromycin base, Feldene [piroxicam], Keflex [cephalexin], Levetiracetam, Mobic [meloxicam], Neurontin [gabapentin], Other, Penicillins, Simvastatin, Tape, Vioxx [rofecoxib], and Zetia [ezetimibe]   Social History   Socioeconomic History   Marital status: Single    Spouse name: Not on file   Number of children: 0   Years of education: 8   Highest education level: Not on file  Occupational History   Occupation: Disabled  Tobacco Use   Smoking status: Every Day    Packs/day: 1.50    Types: Cigarettes   Smokeless tobacco: Former    Types: Nurse, children's Use: Former  Substance and Sexual Activity   Alcohol use: No   Drug use: No   Sexual activity: Not on file  Other Topics Concern   Not on file  Social History Narrative   Patient lives  at home with  mother. Patient has no children. Patient is single. Patient has a 8th grade education. Patient is disabled   Social Determinants of Radio broadcast assistant Strain: Not on file  Food Insecurity: Not on file  Transportation Needs: Not on file  Physical Activity: Not on file  Stress: Not on file  Social Connections: Not on file     Family History: The patient's family history includes Cancer in her father and mother; Cirrhosis in her sister; Diabetes in her father; Heart failure in her father; Hypertension in her father and mother; Rheum arthritis in her mother; Stroke in her mother; Thyroid disease in her mother. ROS:   Please see the history of present illness.    All 14 point review of systems negative except as described per history  of present illness  EKGs/Labs/Other Studies Reviewed:      Recent Labs: 01/10/2022: ALT 6  Recent Lipid Panel    Component Value Date/Time   CHOL 118 01/10/2022 1059   TRIG 179 (H) 01/10/2022 1059   HDL 42 01/10/2022 1059   CHOLHDL 2.8 01/10/2022 1059   LDLCALC 47 01/10/2022 1059    Physical Exam:    VS:  BP 122/76 (BP Location: Left Arm, Patient Position: Sitting)   Pulse 91   Ht 5' (1.524 m)   Wt 108 lb 6.4 oz (49.2 kg)   SpO2 94%   BMI 21.17 kg/m     Wt Readings from Last 3 Encounters:  07/14/22 108 lb 6.4 oz (49.2 kg)  01/20/22 106 lb 9.6 oz (48.4 kg)  01/09/22 106 lb 3.2 oz (48.2 kg)     GEN:  Well nourished, well developed in no acute distress HEENT: Normal NECK: No JVD; No carotid bruits LYMPHATICS: No lymphadenopathy CARDIAC: RRR, no murmurs, no rubs, no gallops RESPIRATORY:  Clear to auscultation without rales, wheezing or rhonchi  ABDOMEN: Soft, non-tender, non-distended MUSCULOSKELETAL:  No edema; No deformity  SKIN: Warm and dry LOWER EXTREMITIES: no swelling NEUROLOGIC:  Alert and oriented x 3 PSYCHIATRIC:  Normal affect   ASSESSMENT:    1. Essential hypertension   2. Gastroesophageal reflux  disease, unspecified whether esophagitis present   3. Dyslipidemia   4. Peripheral vascular disease, unspecified ([place]) cortical 2 disease up to 59%    PLAN:    In order of problems listed above:  Essential hypertension: Blood pressure seems to be well-controlled today.  I will continue present management. Peripheral vascular disease.  I will schedule her to have another cardiac ultrasounds in April. Dyslipidemia.  She was taking PCSK9 agent but many medication has been discontinued recently I do have her K PN which show me cholesterol of 141 HDL 45.  When things stabilize we will recheck her fasting lipid profile and will reevaluate the situation.   Medication Adjustments/Labs and Tests Ordered: Current medicines are reviewed at length with the patient today.  Concerns regarding medicines are outlined above.  No orders of the defined types were placed in this encounter.  Medication changes: No orders of the defined types were placed in this encounter.   Signed, Park Liter, MD, [facility] 07/14/2022 1:14 PM    [place]

## 2022-07-15 ENCOUNTER — Ambulatory Visit
Admission: RE | Admit: 2022-07-15 | Discharge: 2022-07-15 | Disposition: A | Discharge status: Home / Self Care | Payer: Medicare HMO | Source: Ambulatory Visit | Attending: Obstetrics and Gynecology | Admitting: Obstetrics and Gynecology

## 2022-07-15 ENCOUNTER — Other Ambulatory Visit: Payer: Self-pay | Admitting: Obstetrics and Gynecology

## 2022-07-15 DIAGNOSIS — N6320 Unspecified lump in the left breast, unspecified quadrant: Secondary | ICD-10-CM

## 2022-07-15 DIAGNOSIS — R922 Inconclusive mammogram: Secondary | ICD-10-CM | POA: Diagnosis not present

## 2022-07-15 DIAGNOSIS — N6321 Unspecified lump in the left breast, upper outer quadrant: Secondary | ICD-10-CM | POA: Diagnosis not present

## 2022-07-15 DIAGNOSIS — N6322 Unspecified lump in the left breast, upper inner quadrant: Secondary | ICD-10-CM | POA: Diagnosis not present

## 2022-07-17 ENCOUNTER — Telehealth: Payer: Self-pay | Admitting: Cardiology

## 2022-07-17 NOTE — Telephone Encounter (Signed)
Good phone number [phone]  Needs to be re-enrolled in a pt asst program for repatha. Please call patient for details   Thanks

## 2022-07-18 NOTE — Telephone Encounter (Signed)
Patient had been enrolled on 07/05/22. Expires 07/05/23.  CARD NO. [id number]   BIN [id number]   PCN [id number]   PC GROUP [id number]

## 2022-07-19 DIAGNOSIS — M25512 Pain in left shoulder: Secondary | ICD-10-CM | POA: Diagnosis not present

## 2022-07-19 DIAGNOSIS — G8929 Other chronic pain: Secondary | ICD-10-CM | POA: Diagnosis not present

## 2022-07-19 NOTE — Telephone Encounter (Signed)
LVM for pt regarding Repatha program renewal. Advised to call with any question or if she needs card information to get Repatha.

## 2022-07-21 ENCOUNTER — Ambulatory Visit
Admission: RE | Admit: 2022-07-21 | Discharge: 2022-07-21 | Disposition: A | Discharge status: Home / Self Care | Payer: Medicare HMO | Source: Ambulatory Visit | Attending: Obstetrics and Gynecology | Admitting: Obstetrics and Gynecology

## 2022-07-21 DIAGNOSIS — N6320 Unspecified lump in the left breast, unspecified quadrant: Secondary | ICD-10-CM

## 2022-07-21 DIAGNOSIS — N6325 Unspecified lump in the left breast, overlapping quadrants: Secondary | ICD-10-CM | POA: Diagnosis not present

## 2022-07-21 DIAGNOSIS — N6012 Diffuse cystic mastopathy of left breast: Secondary | ICD-10-CM | POA: Diagnosis not present

## 2022-07-21 HISTORY — PX: BREAST BIOPSY: SHX20

## 2022-07-25 DIAGNOSIS — M6281 Muscle weakness (generalized): Secondary | ICD-10-CM | POA: Diagnosis not present

## 2022-07-25 DIAGNOSIS — M25512 Pain in left shoulder: Secondary | ICD-10-CM | POA: Diagnosis not present

## 2022-07-26 DIAGNOSIS — B370 Candidal stomatitis: Secondary | ICD-10-CM | POA: Diagnosis not present

## 2022-07-27 DIAGNOSIS — M25512 Pain in left shoulder: Secondary | ICD-10-CM | POA: Diagnosis not present

## 2022-07-27 DIAGNOSIS — M6281 Muscle weakness (generalized): Secondary | ICD-10-CM | POA: Diagnosis not present

## 2022-08-01 DIAGNOSIS — M25512 Pain in left shoulder: Secondary | ICD-10-CM | POA: Diagnosis not present

## 2022-08-01 DIAGNOSIS — M6281 Muscle weakness (generalized): Secondary | ICD-10-CM | POA: Diagnosis not present

## 2022-08-03 DIAGNOSIS — K5900 Constipation, unspecified: Secondary | ICD-10-CM | POA: Diagnosis not present

## 2022-08-03 DIAGNOSIS — K259 Gastric ulcer, unspecified as acute or chronic, without hemorrhage or perforation: Secondary | ICD-10-CM | POA: Diagnosis not present

## 2022-08-03 DIAGNOSIS — K2270 Barrett's esophagus without dysplasia: Secondary | ICD-10-CM | POA: Diagnosis not present

## 2022-08-03 DIAGNOSIS — K219 Gastro-esophageal reflux disease without esophagitis: Secondary | ICD-10-CM | POA: Diagnosis not present

## 2022-08-08 DIAGNOSIS — M25512 Pain in left shoulder: Secondary | ICD-10-CM | POA: Diagnosis not present

## 2022-08-08 DIAGNOSIS — M6281 Muscle weakness (generalized): Secondary | ICD-10-CM | POA: Diagnosis not present

## 2022-08-10 DIAGNOSIS — M6281 Muscle weakness (generalized): Secondary | ICD-10-CM | POA: Diagnosis not present

## 2022-08-10 DIAGNOSIS — M25512 Pain in left shoulder: Secondary | ICD-10-CM | POA: Diagnosis not present

## 2022-08-17 DIAGNOSIS — I700 Atherosclerosis of aorta: Secondary | ICD-10-CM | POA: Diagnosis not present

## 2022-08-17 DIAGNOSIS — E782 Mixed hyperlipidemia: Secondary | ICD-10-CM | POA: Diagnosis not present

## 2022-08-17 DIAGNOSIS — G720 Drug-induced myopathy: Secondary | ICD-10-CM | POA: Diagnosis not present

## 2022-08-17 DIAGNOSIS — M12812 Other specific arthropathies, not elsewhere classified, left shoulder: Secondary | ICD-10-CM | POA: Diagnosis not present

## 2022-08-17 DIAGNOSIS — G40909 Epilepsy, unspecified, not intractable, without status epilepticus: Secondary | ICD-10-CM | POA: Diagnosis not present

## 2022-08-17 DIAGNOSIS — M75102 Unspecified rotator cuff tear or rupture of left shoulder, not specified as traumatic: Secondary | ICD-10-CM | POA: Diagnosis not present

## 2022-08-17 DIAGNOSIS — K4020 Bilateral inguinal hernia, without obstruction or gangrene, not specified as recurrent: Secondary | ICD-10-CM | POA: Diagnosis not present

## 2022-08-17 DIAGNOSIS — F17210 Nicotine dependence, cigarettes, uncomplicated: Secondary | ICD-10-CM | POA: Diagnosis not present

## 2022-08-17 DIAGNOSIS — N1831 Chronic kidney disease, stage 3a: Secondary | ICD-10-CM | POA: Diagnosis not present

## 2022-08-17 LAB — LAB REPORT - SCANNED: EGFR: 49

## 2022-08-18 ENCOUNTER — Other Ambulatory Visit: Payer: Self-pay | Admitting: Obstetrics and Gynecology

## 2022-08-18 DIAGNOSIS — Z1231 Encounter for screening mammogram for malignant neoplasm of breast: Secondary | ICD-10-CM

## 2022-08-22 DIAGNOSIS — R1032 Left lower quadrant pain: Secondary | ICD-10-CM | POA: Diagnosis not present

## 2022-08-22 DIAGNOSIS — I10 Essential (primary) hypertension: Secondary | ICD-10-CM | POA: Diagnosis not present

## 2022-08-22 DIAGNOSIS — M6281 Muscle weakness (generalized): Secondary | ICD-10-CM | POA: Diagnosis not present

## 2022-08-22 DIAGNOSIS — K219 Gastro-esophageal reflux disease without esophagitis: Secondary | ICD-10-CM | POA: Diagnosis not present

## 2022-08-22 DIAGNOSIS — Z720 Tobacco use: Secondary | ICD-10-CM | POA: Diagnosis not present

## 2022-08-22 DIAGNOSIS — R1031 Right lower quadrant pain: Secondary | ICD-10-CM | POA: Diagnosis not present

## 2022-08-22 DIAGNOSIS — M25512 Pain in left shoulder: Secondary | ICD-10-CM | POA: Diagnosis not present

## 2022-08-23 ENCOUNTER — Ambulatory Visit
Admission: RE | Admit: 2022-08-23 | Discharge: 2022-08-23 | Disposition: A | Discharge status: Home / Self Care | Payer: Medicare HMO | Source: Ambulatory Visit | Attending: Obstetrics and Gynecology | Admitting: Obstetrics and Gynecology

## 2022-08-23 DIAGNOSIS — Z1231 Encounter for screening mammogram for malignant neoplasm of breast: Secondary | ICD-10-CM | POA: Diagnosis not present

## 2022-08-24 ENCOUNTER — Other Ambulatory Visit: Payer: Self-pay | Admitting: Cardiology

## 2022-08-30 DIAGNOSIS — I700 Atherosclerosis of aorta: Secondary | ICD-10-CM | POA: Diagnosis not present

## 2022-08-30 DIAGNOSIS — N200 Calculus of kidney: Secondary | ICD-10-CM | POA: Diagnosis not present

## 2022-08-30 DIAGNOSIS — R1031 Right lower quadrant pain: Secondary | ICD-10-CM | POA: Diagnosis not present

## 2022-08-30 DIAGNOSIS — R1030 Lower abdominal pain, unspecified: Secondary | ICD-10-CM | POA: Diagnosis not present

## 2022-09-12 DIAGNOSIS — R1032 Left lower quadrant pain: Secondary | ICD-10-CM | POA: Diagnosis not present

## 2022-09-12 DIAGNOSIS — I10 Essential (primary) hypertension: Secondary | ICD-10-CM | POA: Diagnosis not present

## 2022-09-12 DIAGNOSIS — Z720 Tobacco use: Secondary | ICD-10-CM | POA: Diagnosis not present

## 2022-09-12 DIAGNOSIS — K219 Gastro-esophageal reflux disease without esophagitis: Secondary | ICD-10-CM | POA: Diagnosis not present

## 2022-09-12 DIAGNOSIS — R1031 Right lower quadrant pain: Secondary | ICD-10-CM | POA: Diagnosis not present

## 2022-09-18 ENCOUNTER — Ambulatory Visit: Payer: Medicare HMO | Attending: Cardiology

## 2022-09-18 DIAGNOSIS — I6521 Occlusion and stenosis of right carotid artery: Secondary | ICD-10-CM

## 2022-09-18 DIAGNOSIS — I739 Peripheral vascular disease, unspecified: Secondary | ICD-10-CM | POA: Diagnosis not present

## 2022-09-20 ENCOUNTER — Other Ambulatory Visit: Payer: Self-pay | Admitting: Cardiology

## 2022-09-20 DIAGNOSIS — I739 Peripheral vascular disease, unspecified: Secondary | ICD-10-CM

## 2022-09-22 ENCOUNTER — Telehealth: Payer: Self-pay

## 2022-09-22 NOTE — Telephone Encounter (Signed)
Pt viewed results in My Chart. Routed to PCP.  

## 2022-10-05 ENCOUNTER — Ambulatory Visit: Payer: Medicare HMO | Attending: Cardiology

## 2022-10-05 DIAGNOSIS — I739 Peripheral vascular disease, unspecified: Secondary | ICD-10-CM | POA: Diagnosis not present

## 2022-10-05 LAB — VAS US ABI WITH/WO TBI
Left ABI: 1.09
Right ABI: 1.21

## 2022-10-11 ENCOUNTER — Telehealth: Payer: Self-pay

## 2022-10-11 DIAGNOSIS — J439 Emphysema, unspecified: Secondary | ICD-10-CM | POA: Diagnosis not present

## 2022-10-11 DIAGNOSIS — G894 Chronic pain syndrome: Secondary | ICD-10-CM | POA: Diagnosis not present

## 2022-10-11 DIAGNOSIS — M961 Postlaminectomy syndrome, not elsewhere classified: Secondary | ICD-10-CM | POA: Diagnosis not present

## 2022-10-11 DIAGNOSIS — M5137 Other intervertebral disc degeneration, lumbosacral region: Secondary | ICD-10-CM | POA: Diagnosis not present

## 2022-10-11 DIAGNOSIS — Z79899 Other long term (current) drug therapy: Secondary | ICD-10-CM | POA: Diagnosis not present

## 2022-10-11 NOTE — Telephone Encounter (Signed)
Called pt with normal ABI results. LVM per DPR. Encouraged pt to call with any questions.

## 2022-10-13 ENCOUNTER — Telehealth: Payer: Self-pay

## 2022-10-13 NOTE — Telephone Encounter (Signed)
Spoke with pt about ABI's per Dr. Vanetta Shawl note, advised that her test was normal per Dr. Bing Matter. Pt verbalized understanding and had no further questions.

## 2022-10-26 DIAGNOSIS — K649 Unspecified hemorrhoids: Secondary | ICD-10-CM | POA: Diagnosis not present

## 2022-10-26 DIAGNOSIS — R634 Abnormal weight loss: Secondary | ICD-10-CM | POA: Diagnosis not present

## 2022-10-26 DIAGNOSIS — K259 Gastric ulcer, unspecified as acute or chronic, without hemorrhage or perforation: Secondary | ICD-10-CM | POA: Diagnosis not present

## 2022-12-26 DIAGNOSIS — N1831 Chronic kidney disease, stage 3a: Secondary | ICD-10-CM | POA: Diagnosis not present

## 2022-12-26 DIAGNOSIS — B370 Candidal stomatitis: Secondary | ICD-10-CM | POA: Diagnosis not present

## 2022-12-26 DIAGNOSIS — I700 Atherosclerosis of aorta: Secondary | ICD-10-CM | POA: Diagnosis not present

## 2022-12-26 DIAGNOSIS — G40909 Epilepsy, unspecified, not intractable, without status epilepticus: Secondary | ICD-10-CM | POA: Diagnosis not present

## 2022-12-26 DIAGNOSIS — K2960 Other gastritis without bleeding: Secondary | ICD-10-CM | POA: Diagnosis not present

## 2022-12-26 DIAGNOSIS — B3781 Candidal esophagitis: Secondary | ICD-10-CM | POA: Diagnosis not present

## 2022-12-26 DIAGNOSIS — G720 Drug-induced myopathy: Secondary | ICD-10-CM | POA: Diagnosis not present

## 2022-12-26 DIAGNOSIS — I10 Essential (primary) hypertension: Secondary | ICD-10-CM | POA: Diagnosis not present

## 2022-12-26 DIAGNOSIS — E782 Mixed hyperlipidemia: Secondary | ICD-10-CM | POA: Diagnosis not present

## 2023-01-03 DIAGNOSIS — M5137 Other intervertebral disc degeneration, lumbosacral region: Secondary | ICD-10-CM | POA: Diagnosis not present

## 2023-01-03 DIAGNOSIS — M545 Low back pain, unspecified: Secondary | ICD-10-CM | POA: Diagnosis not present

## 2023-01-03 DIAGNOSIS — G894 Chronic pain syndrome: Secondary | ICD-10-CM | POA: Diagnosis not present

## 2023-01-03 DIAGNOSIS — M961 Postlaminectomy syndrome, not elsewhere classified: Secondary | ICD-10-CM | POA: Diagnosis not present

## 2023-01-05 DIAGNOSIS — N760 Acute vaginitis: Secondary | ICD-10-CM | POA: Diagnosis not present

## 2023-01-05 DIAGNOSIS — N762 Acute vulvitis: Secondary | ICD-10-CM | POA: Diagnosis not present

## 2023-01-10 ENCOUNTER — Encounter: Payer: Self-pay | Admitting: Cardiology

## 2023-01-10 ENCOUNTER — Ambulatory Visit: Payer: Medicare HMO | Attending: Cardiology | Admitting: Cardiology

## 2023-01-10 VITALS — BP 100/62 | HR 76 | Ht 61.0 in | Wt 102.4 lb

## 2023-01-10 DIAGNOSIS — K219 Gastro-esophageal reflux disease without esophagitis: Secondary | ICD-10-CM | POA: Diagnosis not present

## 2023-01-10 DIAGNOSIS — I10 Essential (primary) hypertension: Secondary | ICD-10-CM | POA: Diagnosis not present

## 2023-01-10 DIAGNOSIS — R0609 Other forms of dyspnea: Secondary | ICD-10-CM

## 2023-01-10 DIAGNOSIS — E785 Hyperlipidemia, unspecified: Secondary | ICD-10-CM

## 2023-01-10 DIAGNOSIS — F17200 Nicotine dependence, unspecified, uncomplicated: Secondary | ICD-10-CM | POA: Diagnosis not present

## 2023-01-10 DIAGNOSIS — I739 Peripheral vascular disease, unspecified: Secondary | ICD-10-CM | POA: Diagnosis not present

## 2023-01-10 DIAGNOSIS — K22719 Barrett's esophagus with dysplasia, unspecified: Secondary | ICD-10-CM

## 2023-01-10 MED ORDER — SPIRONOLACTONE-HCTZ 25-25 MG PO TABS: 1.0000 | ORAL_TABLET | Freq: Every day | ORAL | 3 refills | Status: DC

## 2023-01-10 MED ORDER — ISOSORBIDE MONONITRATE ER 30 MG PO TB24: 30.0000 mg | ORAL_TABLET | Freq: Every day | ORAL | 3 refills | Status: DC

## 2023-01-10 NOTE — Patient Instructions (Addendum)
Medication Instructions:   START: Imdur 30mg  1 tablet daily  DECREASE:  spironolactone-hydrochlorothiazide (ALDACTAZIDE) 25-25 MG tablet- 1/2 tablet daily- If you have lower leg swelling and your blood pressures are ok then you can take a whole tablet   Lab Work: Lipid, CMP- today If you have labs (blood work) drawn today and your tests are completely normal, you will receive your results only by: MyChart Message (if you have MyChart) OR A paper copy in the mail If you have any lab test that is abnormal or we need to change your treatment, we will call you to review the results.   Testing/Procedures: Your physician has requested that you have an echocardiogram. Echocardiography is a painless test that uses sound waves to create images of your heart. It provides your doctor with information about the size and shape of your heart and how well your heart's chambers and valves are working. This procedure takes approximately one hour. There are no restrictions for this procedure. Please do NOT wear cologne, perfume, aftershave, or lotions (deodorant is allowed). Please arrive 15 minutes prior to your appointment time.    Follow-Up: At [facility], you and your health needs are our priority.  As part of our continuing mission to provide you with exceptional heart care, we have created designated Provider Care Teams.  These Care Teams include your primary Cardiologist (physician) and Advanced Practice Providers (APPs -  Physician Assistants and Nurse Practitioners) who all work together to provide you with the care you need, when you need it.  We recommend signing up for the patient portal called "MyChart".  Sign up information is provided on this After Visit Summary.  MyChart is used to connect with patients for Virtual Visits (Telemedicine).  Patients are able to view lab/test results, encounter notes, upcoming appointments, etc.  Non-urgent messages can be sent to your provider as well.   To  learn more about what you can do with MyChart, go to [email or web address].    Your next appointment:   3 month(s)  The format for your next appointment:   In Person  Provider:   Gypsy Balsam, MD    Other Instructions NA

## 2023-01-10 NOTE — Progress Notes (Signed)
Cardiology Office Note:    Date:  01/10/2023   ID:  Crystal Lyons, DOB 03/05/1966, MRN [id number]  PCP:  Street, Stephanie Coup, MD  Cardiologist:  Gypsy Balsam, MD    Referring MD: Street, Stephanie Coup, *   Chief Complaint  Patient presents with   Leg Swelling   burning sensation and tightness    History of Present Illness:    Crystal Lyons is a 57 y.o. female medical history significant for chronic smoker which is still ongoing, carotic arterial disease with up to 59% stenosis, dyslipidemia she is on PCSK9 agent, she did have evaluation of coronary artery done in 2022 with in form of coronary CT angio which showed no coronary artery disease and then last year she had another stress test done which showed no evidence of ischemia.  Comes today to my office for follow-up.  She is very unhappy, she lost her boyfriend of 16 years last month and obviously she is grieving after that.  She described feeling overall very uncomfortable pains all over pains when she eats pain when she walks.  Sadly she still continues to smoke.  She does see gastroenterologist upon she was found to have ulcer and Barrett's esophagus.  She also have thrush being treated for that in her mouth.  Her appetite is very poor, she lost significant amount of weight.  Past Medical History:  Diagnosis Date   Anxiety    Anxiety disorder 08/19/2013   Atypical chest pain    Barrett esophagus    Brachial neuritis 11/23/2012   Carpal tunnel syndrome    right and left wrists   Cervical post-laminectomy syndrome 08/19/2013   Chronic headache disorder 05/08/2016   Chronic neck pain 03/15/2015   DDD (degenerative disc disease), lumbosacral 08/19/2013   Disorder of bone and cartilage 04/21/2013   Dyslipidemia 01/21/2015   Dysrhythmia    Encounter for therapeutic drug monitoring 05/01/2019   Epilepsy ([facility]) 02/24/2014   Essential hypertension 11/20/2017   GERD (gastroesophageal reflux disease)    Headache 03/09/2014   Headache(784.0)  03/09/2014   Heart murmur    Hiatal hernia    Hypercholesterolemia 11/23/2012   Hyperlipidemia    Hypertension    Low back pain 11/23/2012   Lumbar radicular pain 04/21/2013   Myalgia and myositis 08/19/2013   Osteoarthritis of spine with radiculopathy, 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/15/2015   Osteopenia    Seizure disorder ([facility]) 08/19/2013   Seizures ([facility]) 09/27/2012   Shingles    Sinus tachycardia 08/16/2018   Sleep apnea    Sleep disorder 09/27/2012   Smoking 11/20/2017   Vitamin D deficiency 02/24/2014    Past Surgical History:  Procedure Laterality Date   BRAIN SURGERY  01/06/1985   BRAIN SURGERY Right 1986   "right temporal lobe"   BREAST BIOPSY Right 12/04/2005   BREAST BIOPSY Left 07/21/2022   [name] LT BREAST BX W LOC DEV 1ST LESION IMG BX SPEC US GUIDE 07/21/2022 [facility]   CHOLECYSTECTOMY     CYST REMOVAL TRUNK     ears     EYE SURGERY     cyst removed   GALLBLADDER SURGERY N/A 2009   NECK SURGERY     SHOULDER SURGERY Bilateral 2009, 2010   UPPER GI ENDOSCOPY  10/2020   VULVECTOMY N/A 07/04/2018   Procedure: WIDE EXCISION VULVECTOMY;  Surgeon: Waynard Reeds, MD;  Location: [facility];  Service: Gynecology;  Laterality: N/A;    Current Medications: Current Meds  Medication Sig   albuterol (PROVENTIL HFA;VENTOLIN  HFA) 108 (90 Base) MCG/ACT inhaler Inhale 2 puffs into the lungs every 6 (six) hours as needed for wheezing or shortness of breath.   ALPRAZolam (XANAX) 0.5 MG tablet Take 0.5 mg by mouth 3 (three) times daily as needed for anxiety.   clobetasol ointment (TEMOVATE) 0.05 % Apply 1 application topically 2 (two) times daily.   denosumab (PROLIA) 60 MG/ML SOSY injection Inject 60 mg into the skin every 6 (six) months.   dicyclomine (BENTYL) 10 MG capsule Take 10 mg by mouth every 6 (six) hours as needed for spasms. For abdominal pain   Evolocumab (REPATHA SURECLICK) 140 MG/ML SOAJ INJECT 140 MG INTO THE SKIN EVERY 14 (FOURTEEN) DAYS.   famotidine (PEPCID) 20 MG tablet Take 40 mg by  mouth daily.   Fexofenadine-Pseudoephedrine (ALLEGRA-D 12 HOUR PO) Take 1 tablet by mouth every other day.   fluconazole (DIFLUCAN) 150 MG tablet Take 150 mg by mouth every other day.   HYDROcodone-acetaminophen (NORCO/VICODIN) 5-325 MG per tablet Take 1 tablet by mouth as needed for moderate pain or severe pain (Up tp 4 times a day).   lansoprazole (PREVACID) 30 MG capsule Take 30 mg by mouth daily.    linaclotide (LINZESS) 145 MCG CAPS capsule Take 145 mcg by mouth daily before breakfast.   neomycin-bacitracin-polymyxin [phone] OINT Apply 1 application. topically 3 (three) times daily. 3.5   omeprazole (PRILOSEC) 20 MG capsule Take 40 mg by mouth as needed (indigestion).   spironolactone-hydrochlorothiazide (ALDACTAZIDE) 25-25 MG tablet Take 1 tablet by mouth daily.   terconazole (TERAZOL 3) 0.8 % vaginal cream Place 1 applicator vaginally at bedtime.   tiotropium (SPIRIVA) 18 MCG inhalation capsule Place 18 mcg into inhaler and inhale as needed (sob or wheezing).   tiZANidine (ZANAFLEX) 4 MG capsule Take 4 mg by mouth 3 (three) times daily.   VIVELLE-DOT 0.05 MG/24HR Place 1 patch onto the skin 2 (two) times a week.      Allergies:   Adenosine, Azithromycin, Alendronate, Aleve [naproxen sodium], Atorvastatin, Celecoxib, Clonazepam, Clozapine, Crestor [rosuvastatin], Erythromycin base, Feldene [piroxicam], Keflex [cephalexin], Levetiracetam, Mobic [meloxicam], Neurontin [gabapentin], Other, Penicillins, Simvastatin, Tape, Vioxx [rofecoxib], and Zetia [ezetimibe]   Social History   Socioeconomic History   Marital status: Single    Spouse name: Not on file   Number of children: 0   Years of education: 8   Highest education level: Not on file  Occupational History   Occupation: Disabled  Tobacco Use   Smoking status: Every Day    Current packs/day: 1.50    Types: Cigarettes   Smokeless tobacco: Former    Types: Engineer, drilling   Vaping status: Former  Substance and Sexual  Activity   Alcohol use: No   Drug use: No   Sexual activity: Not on file  Other Topics Concern   Not on file  Social History Narrative   Patient lives at home with  mother. Patient has no children. Patient is single. Patient has a 8th grade education. Patient is disabled   Social Determinants of Corporate investment banker Strain: Not on file  Food Insecurity: Not on file  Transportation Needs: Not on file  Physical Activity: Not on file  Stress: Not on file  Social Connections: Not on file     Family History: The patient's family history includes Cancer in her father and mother; Cirrhosis in her sister; Diabetes in her father; Heart failure in her father; Hypertension in her father and mother; Rheum arthritis in her mother; Stroke  in her mother; Thyroid disease in her mother. ROS:   Please see the history of present illness.    All 14 point review of systems negative except as described per history of present illness  EKGs/Labs/Other Studies Reviewed:         Recent Labs: No results found for requested labs within last 365 days.  Recent Lipid Panel    Component Value Date/Time   CHOL 118 01/10/2022 1059   TRIG 179 (H) 01/10/2022 1059   HDL 42 01/10/2022 1059   CHOLHDL 2.8 01/10/2022 1059   LDLCALC 47 01/10/2022 1059    Physical Exam:    VS:  BP 100/62 (BP Location: Left Arm, Patient Position: Sitting)   Pulse 76   Ht 5\' 1"  (1.549 m)   Wt 102 lb 6.4 oz (46.4 kg)   SpO2 99%   BMI 19.35 kg/m     Wt Readings from Last 3 Encounters:  01/10/23 102 lb 6.4 oz (46.4 kg)  07/14/22 108 lb 6.4 oz (49.2 kg)  01/20/22 106 lb 9.6 oz (48.4 kg)     GEN:  Well nourished, well developed in no acute distress HEENT: Normal NECK: No JVD; No carotid bruits LYMPHATICS: No lymphadenopathy CARDIAC: RRR, no murmurs, no rubs, no gallops RESPIRATORY:  Clear to auscultation without rales, wheezing or rhonchi  ABDOMEN: Soft, non-tender, non-distended MUSCULOSKELETAL:  No edema; No  deformity  SKIN: Warm and dry LOWER EXTREMITIES: no swelling NEUROLOGIC:  Alert and oriented x 3 PSYCHIATRIC:  Normal affect   ASSESSMENT:    1. Essential hypertension   2. Peripheral vascular disease, unspecified ([facility]) carotic   disease up to 59%   3. Barrett's esophagus with dysplasia   4. Gastroesophageal reflux disease, unspecified whether esophagitis present   5. Dyslipidemia   6. Smoking    PLAN:    In order of problems listed above:  Constellation of atypical symptoms I suspect may be some psychological issues happening here as well.  She told me interesting story she said when she take nitroglycerin to relieve pain in the chest as well as pain in her legs.  Therefore, I will put her on long-acting nitroglycerin.  Will go with Imdur 30 mg daily.  I will ask her to cut down her hydrochlorothiazide/Aldactone in half. I will ask her to have an echocardiogram done to assess left ventricle ejection fraction because of dyspnea on exertion and fatigue which probably is related mostly to smoking but I need to rule out heart issue. Dyslipidemia she is taking PCSK9 agent.  Will check her fasting lipid profile. Smoking still a problem and I again asked her to hopefully be able to stop it.    Medication Adjustments/Labs and Tests Ordered: Current medicines are reviewed at length with the patient today.  Concerns regarding medicines are outlined above.  No orders of the defined types were placed in this encounter.  Medication changes: No orders of the defined types were placed in this encounter.   Signed, Georgeanna Lea, MD, [facility] 01/10/2023 1:26 PM    [facility]

## 2023-01-10 NOTE — Addendum Note (Signed)
Addended by: Baldo Ash D on: 01/10/2023 01:42 PM   Modules accepted: Orders

## 2023-01-11 LAB — COMPREHENSIVE METABOLIC PANEL
ALT: 5 IU/L (ref 0–32)
Albumin: 4.6 g/dL (ref 3.8–4.9)
BUN/Creatinine Ratio: 9 (ref 9–23)
BUN: 15 mg/dL (ref 6–24)
Bilirubin Total: <0.2 mg/dL (ref 0.0–1.2)
CO2: 23 mmol/L (ref 20–29)
Calcium: 10.1 mg/dL (ref 8.7–10.2)
Chloride: 103 mmol/L (ref 96–106)
Creatinine, Ser: 1.7 mg/dL — ABNORMAL HIGH (ref 0.57–1.00)
Globulin, Total: 2.5 g/dL (ref 1.5–4.5)
Potassium: 5.1 mmol/L (ref 3.5–5.2)
Sodium: 139 mmol/L (ref 134–144)
Total Protein: 7.1 g/dL (ref 6.0–8.5)
eGFR: 35 mL/min/{1.73_m2} — ABNORMAL LOW (ref >59)

## 2023-01-11 LAB — LIPID PANEL
Chol/HDL Ratio: 2.8 ratio (ref 0.0–4.4)
HDL: 47 mg/dL (ref >39)
LDL Chol Calc (NIH): 59 mg/dL (ref 0–99)
Triglycerides: 158 mg/dL — ABNORMAL HIGH (ref 0–149)
VLDL Cholesterol Cal: 27 mg/dL (ref 5–40)

## 2023-01-19 ENCOUNTER — Telehealth: Payer: Self-pay

## 2023-01-19 DIAGNOSIS — I10 Essential (primary) hypertension: Secondary | ICD-10-CM

## 2023-01-19 NOTE — Telephone Encounter (Signed)
-----   Message from Gypsy Balsam sent at 01/19/2023  8:36 AM EDT ----- Blood test showed nicely controlled cholesterol, interesting her kidney function is worse creatinine is 1.7.  In 2 weeks lets repeat Chem-7

## 2023-01-19 NOTE — Telephone Encounter (Signed)
Patient notified of results and recommendations and agreed with plan, lab order on file.  She also report d/c Isosorbide due to severe headaches. Messaged Dr. Kirtland Bouchard with information.

## 2023-02-02 ENCOUNTER — Ambulatory Visit: Payer: Medicare HMO

## 2023-02-02 DIAGNOSIS — I10 Essential (primary) hypertension: Secondary | ICD-10-CM | POA: Diagnosis not present

## 2023-03-06 ENCOUNTER — Ambulatory Visit: Payer: Medicare HMO | Attending: Cardiology

## 2023-03-06 DIAGNOSIS — R0609 Other forms of dyspnea: Secondary | ICD-10-CM

## 2023-03-06 LAB — ECHOCARDIOGRAM COMPLETE
AR max vel: 1.56 cm2
AV Area VTI: 1.6 cm2
AV Area mean vel: 1.55 cm2
AV Mean grad: 7 mmHg
AV Peak grad: 13.3 mmHg
Ao pk vel: 1.83 m/s
Area-P 1/2: 4.16 cm2
S' Lateral: 2.4 cm

## 2023-03-28 DIAGNOSIS — E782 Mixed hyperlipidemia: Secondary | ICD-10-CM | POA: Diagnosis not present

## 2023-03-28 DIAGNOSIS — I10 Essential (primary) hypertension: Secondary | ICD-10-CM | POA: Diagnosis not present

## 2023-03-28 DIAGNOSIS — M8589 Other specified disorders of bone density and structure, multiple sites: Secondary | ICD-10-CM | POA: Diagnosis not present

## 2023-03-28 DIAGNOSIS — Z23 Encounter for immunization: Secondary | ICD-10-CM | POA: Diagnosis not present

## 2023-03-28 DIAGNOSIS — N1831 Chronic kidney disease, stage 3a: Secondary | ICD-10-CM | POA: Diagnosis not present

## 2023-03-28 DIAGNOSIS — Z131 Encounter for screening for diabetes mellitus: Secondary | ICD-10-CM | POA: Diagnosis not present

## 2023-03-28 DIAGNOSIS — E559 Vitamin D deficiency, unspecified: Secondary | ICD-10-CM | POA: Diagnosis not present

## 2023-03-28 DIAGNOSIS — D519 Vitamin B12 deficiency anemia, unspecified: Secondary | ICD-10-CM | POA: Diagnosis not present

## 2023-03-28 DIAGNOSIS — G720 Drug-induced myopathy: Secondary | ICD-10-CM | POA: Diagnosis not present

## 2023-04-05 DIAGNOSIS — M542 Cervicalgia: Secondary | ICD-10-CM | POA: Diagnosis not present

## 2023-04-05 DIAGNOSIS — Z79899 Other long term (current) drug therapy: Secondary | ICD-10-CM | POA: Diagnosis not present

## 2023-04-05 DIAGNOSIS — G894 Chronic pain syndrome: Secondary | ICD-10-CM | POA: Diagnosis not present

## 2023-04-05 DIAGNOSIS — M5137 Other intervertebral disc degeneration, lumbosacral region with discogenic back pain only: Secondary | ICD-10-CM | POA: Diagnosis not present

## 2023-04-05 DIAGNOSIS — Z981 Arthrodesis status: Secondary | ICD-10-CM | POA: Diagnosis not present

## 2023-04-05 DIAGNOSIS — M961 Postlaminectomy syndrome, not elsewhere classified: Secondary | ICD-10-CM | POA: Diagnosis not present

## 2023-04-11 ENCOUNTER — Encounter: Payer: Self-pay | Admitting: Cardiology

## 2023-04-12 ENCOUNTER — Encounter: Payer: Self-pay | Admitting: Cardiology

## 2023-04-12 ENCOUNTER — Ambulatory Visit: Payer: Medicare HMO | Attending: Cardiology | Admitting: Cardiology

## 2023-04-12 VITALS — BP 96/60 | HR 85 | Ht 61.0 in | Wt 105.2 lb

## 2023-04-12 DIAGNOSIS — F17200 Nicotine dependence, unspecified, uncomplicated: Secondary | ICD-10-CM

## 2023-04-12 DIAGNOSIS — I10 Essential (primary) hypertension: Secondary | ICD-10-CM

## 2023-04-12 DIAGNOSIS — K22719 Barrett's esophagus with dysplasia, unspecified: Secondary | ICD-10-CM | POA: Diagnosis not present

## 2023-04-12 DIAGNOSIS — E785 Hyperlipidemia, unspecified: Secondary | ICD-10-CM | POA: Diagnosis not present

## 2023-04-12 DIAGNOSIS — R0789 Other chest pain: Secondary | ICD-10-CM

## 2023-04-12 DIAGNOSIS — I739 Peripheral vascular disease, unspecified: Secondary | ICD-10-CM | POA: Diagnosis not present

## 2023-04-12 DIAGNOSIS — M810 Age-related osteoporosis without current pathological fracture: Secondary | ICD-10-CM | POA: Diagnosis not present

## 2023-04-12 NOTE — Progress Notes (Signed)
Cardiology Office Note:    Date:  04/12/2023   ID:  Crystal Lyons, DOB Aug 20, 1965, MRN [id number]  PCP:  Street, Stephanie Coup, MD  Cardiologist:  Gypsy Balsam, MD    Referring MD: Street, Stephanie Coup, *   Chief Complaint  Patient presents with   Chest Pain    History of Present Illness:    Crystal Lyons is a 57 y.o. female past medical history significant for chronic smoking which is still ongoing, peripheral vascular disease in form of coronary arterial disease with up to 59% stenosis, dyslipidemia she is on PCSK9 agent.  She did have quite extensive evaluation of coronary arteries started with coronary CT angio in 2022 which showed no evidence of any coronary disease.  Last year she got stress test done which showed no evidence of ischemia recently she had echocardiogram with practically was normal.  Comes today to my office for follow-up she comes today with her mother.  Seems to be doing quite well she said she had few episode of chest pain for which she took nitroglycerin with good relief.  I tried to give her long-acting nitroglycerin isosorbide but she did not like the way she felt after isosorbide I encouraged her to try half a tablet daily and see if it helps however if taking nitroglycerin as needed sublingually helps I encouraged her to continue with that.  I also encouraged her to be a little more active.  Past Medical History:  Diagnosis Date   Anxiety    Anxiety disorder 08/19/2013   Atypical chest pain    Barrett esophagus    Brachial neuritis 11/23/2012   Carpal tunnel syndrome    right and left wrists   Cervical post-laminectomy syndrome 08/19/2013   Chronic headache disorder 05/08/2016   Chronic neck pain 03/15/2015   DDD (degenerative disc disease), lumbosacral 08/19/2013   Disorder of bone and cartilage 04/21/2013   Dyslipidemia 01/21/2015   Dysrhythmia    Encounter for therapeutic drug monitoring 05/01/2019   Epilepsy (HCC) 02/24/2014   Essential  hypertension 11/20/2017   GERD (gastroesophageal reflux disease)    Headache 03/09/2014   Headache(784.0) 03/09/2014   Heart murmur    Hiatal hernia    Hypercholesterolemia 11/23/2012   Hyperlipidemia    Hypertension    Low back pain 11/23/2012   Lumbar radicular pain 04/21/2013   Myalgia and myositis 08/19/2013   Osteoarthritis of spine with radiculopathy, 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/15/2015   Osteopenia    Peripheral vascular disease, unspecified (HCC) carotic   disease up to 59% 07/14/2022   Seizure disorder (HCC) 08/19/2013   Seizures (HCC) 09/27/2012   Shingles    Sinus tachycardia 08/16/2018   Sleep apnea    Sleep disorder 09/27/2012   Smoking 11/20/2017   Vitamin D deficiency 02/24/2014    Past Surgical History:  Procedure Laterality Date   BRAIN SURGERY  01/06/1985   BRAIN SURGERY Right 1986   "right temporal lobe"   BREAST BIOPSY Right 12/04/2005   BREAST BIOPSY Left 07/21/2022   [name] LT BREAST BX W LOC DEV 1ST LESION IMG BX SPEC US GUIDE 07/21/2022 [facility]   CHOLECYSTECTOMY     CYST REMOVAL TRUNK     ears     EYE SURGERY     cyst removed   GALLBLADDER SURGERY N/A 2009   NECK SURGERY     SHOULDER SURGERY Bilateral 2009, 2010   UPPER GI ENDOSCOPY  10/2020   VULVECTOMY N/A 07/04/2018   Procedure: WIDE EXCISION VULVECTOMY;  Surgeon: Waynard Reeds, MD;  Location: [facility];  Service: Gynecology;  Laterality: N/A;    Current Medications: Current Meds  Medication Sig   albuterol (PROVENTIL HFA;VENTOLIN HFA) 108 (90 Base) MCG/ACT inhaler Inhale 2 puffs into the lungs every 6 (six) hours as needed for wheezing or shortness of breath.   ALPRAZolam (XANAX) 0.5 MG tablet Take 0.5 mg by mouth 3 (three) times daily as needed for anxiety.   clobetasol ointment (TEMOVATE) 0.05 % Apply 1 application topically 2 (two) times daily.   denosumab (PROLIA) 60 MG/ML SOSY injection Inject 60 mg into the skin every 6 (six) months.   dicyclomine (BENTYL) 10 MG capsule Take 10 mg by mouth  every 6 (six) hours as needed for spasms. For abdominal pain   Evolocumab (REPATHA SURECLICK) 140 MG/ML SOAJ INJECT 140 MG INTO THE SKIN EVERY 14 (FOURTEEN) DAYS.   famotidine (PEPCID) 20 MG tablet Take 40 mg by mouth daily.   Fexofenadine-Pseudoephedrine (ALLEGRA-D 12 HOUR PO) Take 1 tablet by mouth every other day.   fluconazole (DIFLUCAN) 150 MG tablet Take 150 mg by mouth every other day.   HYDROcodone-acetaminophen (NORCO/VICODIN) 5-325 MG per tablet Take 1 tablet by mouth as needed for moderate pain or severe pain (Up tp 4 times a day).   lansoprazole (PREVACID) 30 MG capsule Take 30 mg by mouth daily.    linaclotide (LINZESS) 145 MCG CAPS capsule Take 145 mcg by mouth daily before breakfast.   neomycin-bacitracin-polymyxin [phone] OINT Apply 1 application. topically 3 (three) times daily. 3.5   nitroGLYCERIN (NITROSTAT) 0.4 MG SL tablet Place 1 tablet (0.4 mg total) under the tongue every 5 (five) minutes as needed for chest pain.   omeprazole (PRILOSEC) 20 MG capsule Take 40 mg by mouth as needed (indigestion).   promethazine (PHENERGAN) 25 MG tablet Take 12.5 mg by mouth every 8 (eight) hours as needed for nausea or vomiting.   spironolactone-hydrochlorothiazide (ALDACTAZIDE) 25-25 MG tablet Take 1 tablet by mouth daily.   terconazole (TERAZOL 3) 0.8 % vaginal cream Place 1 applicator vaginally at bedtime.   tiotropium (SPIRIVA) 18 MCG inhalation capsule Place 18 mcg into inhaler and inhale as needed (sob or wheezing).   tiZANidine (ZANAFLEX) 4 MG capsule Take 4 mg by mouth 3 (three) times daily.   VIVELLE-DOT 0.05 MG/24HR Place 1 patch onto the skin 2 (two) times a week.    [DISCONTINUED] isosorbide mononitrate (IMDUR) 30 MG 24 hr tablet Take 1 tablet (30 mg total) by mouth daily.     Allergies:   Adenosine, Azithromycin, Alendronate, Aleve [naproxen sodium], Atorvastatin, Celecoxib, Clonazepam, Clozapine, Crestor [rosuvastatin], Erythromycin base, Feldene [piroxicam], Keflex  [cephalexin], Levetiracetam, Mobic [meloxicam], Neurontin [gabapentin], Other, Penicillins, Simvastatin, Tape, Vioxx [rofecoxib], and Zetia [ezetimibe]   Social History   Socioeconomic History   Marital status: Single    Spouse name: Not on file   Number of children: 0   Years of education: 8   Highest education level: Not on file  Occupational History   Occupation: Disabled  Tobacco Use   Smoking status: Every Day    Current packs/day: 1.50    Types: Cigarettes   Smokeless tobacco: Former    Types: Engineer, drilling   Vaping status: Former  Substance and Sexual Activity   Alcohol use: No   Drug use: No   Sexual activity: Not on file  Other Topics Concern   Not on file  Social History Narrative   Patient lives at home with  mother. Patient has no children.  Patient is single. Patient has a 8th grade education. Patient is disabled   Social Determinants of Corporate investment banker Strain: Not on file  Food Insecurity: Not on file  Transportation Needs: Not on file  Physical Activity: Not on file  Stress: Not on file  Social Connections: Not on file     Family History: The patient's family history includes Cancer in her father and mother; Cirrhosis in her sister; Diabetes in her father; Heart failure in her father; Hypertension in her father and mother; Rheum arthritis in her mother; Stroke in her mother; Thyroid disease in her mother. ROS:   Please see the history of present illness.    All 14 point review of systems negative except as described per history of present illness  EKGs/Labs/Other Studies Reviewed:    EKG Interpretation Date/Time:  Thursday April 12 2023 13:18:37 EDT Ventricular Rate:  85 PR Interval:  158 QRS Duration:  74 QT Interval:  360 QTC Calculation: 428 R Axis:   46  Text Interpretation: Normal sinus rhythm Possible Left atrial enlargement Low voltage QRS Borderline ECG No previous ECGs available Confirmed by Gypsy Balsam [phone]) on  04/12/2023 1:40:05 PM    Recent Labs: 01/10/2023: ALT 5 02/02/2023: BUN 12; Creatinine, Ser 1.39; Potassium 4.7; Sodium 137  Recent Lipid Panel    Component Value Date/Time   CHOL 133 01/10/2023 1347   TRIG 158 (H) 01/10/2023 1347   HDL 47 01/10/2023 1347   CHOLHDL 2.8 01/10/2023 1347   LDLCALC 59 01/10/2023 1347    Physical Exam:    VS:  BP 96/60 (BP Location: Left Arm, Patient Position: Sitting)   Pulse 85   Ht 5\' 1"  (1.549 m)   Wt 105 lb 3.2 oz (47.7 kg)   SpO2 97%   BMI 19.88 kg/m     Wt Readings from Last 3 Encounters:  04/12/23 105 lb 3.2 oz (47.7 kg)  01/10/23 102 lb 6.4 oz (46.4 kg)  07/14/22 108 lb 6.4 oz (49.2 kg)     GEN:  Well nourished, well developed in no acute distress HEENT: Normal NECK: No JVD; No carotid bruits LYMPHATICS: No lymphadenopathy CARDIAC: RRR, no murmurs, no rubs, no gallops RESPIRATORY:  Clear to auscultation without rales, wheezing or rhonchi  ABDOMEN: Soft, non-tender, non-distended MUSCULOSKELETAL:  No edema; No deformity  SKIN: Warm and dry LOWER EXTREMITIES: no swelling NEUROLOGIC:  Alert and oriented x 3 PSYCHIATRIC:  Normal affect   ASSESSMENT:    1. Essential hypertension   2. Peripheral vascular disease, unspecified (HCC) carotic   disease up to 59%   3. Barrett's esophagus with dysplasia   4. Atypical chest pain   5. Dyslipidemia   6. Smoking    PLAN:    In order of problems listed above:  Essential hypertension blood pressure seems to well-controlled continue present management. Peripheral vascular disease in form of carotic arterial disease.  Last carotic ultrasound done in April of this year showing up to 59% stenosis bilaterally.  Will schedule her to have another carotic ultrasounds next time when I see her. 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  I suspect she may also have some component of esophageal spasm because she tells me when she take nitroglycerin to help with the pain.  I  recommend to follow-up with GI.  Concern is also her weight loss.  But that seems to be stable right now. Smoking still ongoing and again I told her 1 more time that she need to quit.  Medication Adjustments/Labs and Tests Ordered: Current medicines are reviewed at length with the patient today.  Concerns regarding medicines are outlined above.  Orders Placed This Encounter  Procedures   EKG 12-Lead   Medication changes: No orders of the defined types were placed in this encounter.   Signed, Georgeanna Lea, MD, [facility] 04/12/2023 1:41 PM    [facility]

## 2023-04-12 NOTE — Patient Instructions (Signed)

## 2023-04-20 DIAGNOSIS — H5203 Hypermetropia, bilateral: Secondary | ICD-10-CM | POA: Diagnosis not present

## 2023-04-21 ENCOUNTER — Other Ambulatory Visit: Payer: Self-pay | Admitting: Cardiology

## 2023-06-27 DIAGNOSIS — G8929 Other chronic pain: Secondary | ICD-10-CM | POA: Diagnosis not present

## 2023-06-27 DIAGNOSIS — M25512 Pain in left shoulder: Secondary | ICD-10-CM | POA: Diagnosis not present

## 2023-07-05 DIAGNOSIS — Z01419 Encounter for gynecological examination (general) (routine) without abnormal findings: Secondary | ICD-10-CM | POA: Diagnosis not present

## 2023-07-05 DIAGNOSIS — Z0100 Encounter for examination of eyes and vision without abnormal findings: Secondary | ICD-10-CM | POA: Diagnosis not present

## 2023-07-10 DIAGNOSIS — M5137 Other intervertebral disc degeneration, lumbosacral region with discogenic back pain only: Secondary | ICD-10-CM | POA: Diagnosis not present

## 2023-07-10 DIAGNOSIS — M961 Postlaminectomy syndrome, not elsewhere classified: Secondary | ICD-10-CM | POA: Diagnosis not present

## 2023-07-10 DIAGNOSIS — G894 Chronic pain syndrome: Secondary | ICD-10-CM | POA: Diagnosis not present

## 2023-07-11 ENCOUNTER — Other Ambulatory Visit: Payer: Self-pay | Admitting: Obstetrics and Gynecology

## 2023-07-11 DIAGNOSIS — Z1231 Encounter for screening mammogram for malignant neoplasm of breast: Secondary | ICD-10-CM

## 2023-07-12 ENCOUNTER — Other Ambulatory Visit: Payer: Self-pay | Admitting: Obstetrics and Gynecology

## 2023-07-12 DIAGNOSIS — N644 Mastodynia: Secondary | ICD-10-CM

## 2023-08-16 ENCOUNTER — Ambulatory Visit
Admission: RE | Admit: 2023-08-16 | Discharge: 2023-08-16 | Disposition: A | Discharge status: Home / Self Care | Payer: Medicare HMO | Source: Ambulatory Visit | Attending: Obstetrics and Gynecology | Admitting: Obstetrics and Gynecology

## 2023-08-16 ENCOUNTER — Ambulatory Visit: Payer: Medicare HMO

## 2023-08-16 DIAGNOSIS — N644 Mastodynia: Secondary | ICD-10-CM

## 2023-08-16 DIAGNOSIS — N6325 Unspecified lump in the left breast, overlapping quadrants: Secondary | ICD-10-CM | POA: Diagnosis not present

## 2023-08-20 DIAGNOSIS — S41111A Laceration without foreign body of right upper arm, initial encounter: Secondary | ICD-10-CM | POA: Diagnosis not present

## 2023-08-20 DIAGNOSIS — W548XXA Other contact with dog, initial encounter: Secondary | ICD-10-CM | POA: Diagnosis not present

## 2023-08-20 DIAGNOSIS — M79601 Pain in right arm: Secondary | ICD-10-CM | POA: Diagnosis not present

## 2023-08-23 DIAGNOSIS — Z0000 Encounter for general adult medical examination without abnormal findings: Secondary | ICD-10-CM | POA: Diagnosis not present

## 2023-08-23 DIAGNOSIS — G40909 Epilepsy, unspecified, not intractable, without status epilepticus: Secondary | ICD-10-CM | POA: Diagnosis not present

## 2023-08-23 DIAGNOSIS — K2960 Other gastritis without bleeding: Secondary | ICD-10-CM | POA: Diagnosis not present

## 2023-08-23 DIAGNOSIS — N1831 Chronic kidney disease, stage 3a: Secondary | ICD-10-CM | POA: Diagnosis not present

## 2023-08-23 DIAGNOSIS — G720 Drug-induced myopathy: Secondary | ICD-10-CM | POA: Diagnosis not present

## 2023-08-23 DIAGNOSIS — E782 Mixed hyperlipidemia: Secondary | ICD-10-CM | POA: Diagnosis not present

## 2023-08-23 DIAGNOSIS — R634 Abnormal weight loss: Secondary | ICD-10-CM | POA: Diagnosis not present

## 2023-08-23 DIAGNOSIS — W548XXA Other contact with dog, initial encounter: Secondary | ICD-10-CM | POA: Diagnosis not present

## 2023-08-23 DIAGNOSIS — Z681 Body mass index (BMI) 19 or less, adult: Secondary | ICD-10-CM | POA: Diagnosis not present

## 2023-09-06 ENCOUNTER — Other Ambulatory Visit: Payer: Self-pay | Admitting: Cardiology

## 2023-09-07 ENCOUNTER — Encounter: Payer: Self-pay | Admitting: Cardiology

## 2023-09-11 ENCOUNTER — Ambulatory Visit: Payer: Medicare HMO | Attending: Cardiology | Admitting: Cardiology

## 2023-09-11 ENCOUNTER — Encounter: Payer: Self-pay | Admitting: Cardiology

## 2023-09-11 VITALS — BP 114/76 | HR 100 | Ht 61.0 in | Wt 101.6 lb

## 2023-09-11 DIAGNOSIS — I10 Essential (primary) hypertension: Secondary | ICD-10-CM

## 2023-09-11 DIAGNOSIS — R5383 Other fatigue: Secondary | ICD-10-CM

## 2023-09-11 DIAGNOSIS — F17200 Nicotine dependence, unspecified, uncomplicated: Secondary | ICD-10-CM

## 2023-09-11 DIAGNOSIS — E785 Hyperlipidemia, unspecified: Secondary | ICD-10-CM

## 2023-09-11 DIAGNOSIS — I739 Peripheral vascular disease, unspecified: Secondary | ICD-10-CM | POA: Diagnosis not present

## 2023-09-11 NOTE — Progress Notes (Signed)
 Cardiology Office Note:    Date:  09/11/2023   ID:  Crystal Lyons, DOB Sep 05, 1965, MRN [id number]  PCP:  Street, Stephanie Coup, MD  Cardiologist:  Gypsy Balsam, MD    Referring MD: [address] Stephanie Coup, *   No chief complaint on file.   History of Present Illness:    Crystal Lyons is a 58 y.o. female complex past medical history which include smoking she still ongoing, peripheral vascular disease in form of carotic arterial disease up to 59% stenosis, dyslipidemia now on PCSK9 agent, extensive evaluation done for coronary artery disease likely negative she did have coronary CT angio in 2022.  Comes today to months for follow-up complain of having leg pain this time.  She said when she walks she will get pain in the tiny Sometimes look like possibly claudication.  Needs investigated.  Also noticed multiple bruising on her chest upper chest as well as her arm interestingly she tells me she does not take aspirin on the regular basis.  Past Medical History:  Diagnosis Date   Anxiety    Anxiety disorder 08/19/2013   Atypical chest pain    Barrett esophagus    Brachial neuritis 11/23/2012   Carpal tunnel syndrome    right and left wrists   Cervical post-laminectomy syndrome 08/19/2013   Chronic headache disorder 05/08/2016   Chronic neck pain 03/15/2015   DDD (degenerative disc disease), lumbosacral 08/19/2013   Disorder of bone and cartilage 04/21/2013   Dyslipidemia 01/21/2015   Dysrhythmia    Encounter for therapeutic drug monitoring 05/01/2019   Epilepsy (HCC) 02/24/2014   Essential hypertension 11/20/2017   GERD (gastroesophageal reflux disease)    Headache 03/09/2014   Headache(784.0) 03/09/2014   Heart murmur    Hiatal hernia    Hypercholesterolemia 11/23/2012   Hyperlipidemia    Hypertension    Low back pain 11/23/2012   Lumbar radicular pain 04/21/2013   Myalgia and myositis 08/19/2013   Osteoarthritis of spine with radiculopathy, 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/15/2015    Osteopenia    Peripheral vascular disease, unspecified (HCC) carotic   disease up to 59% 07/14/2022   Seizure disorder (HCC) 08/19/2013   Seizures (HCC) 09/27/2012   Shingles    Sinus tachycardia 08/16/2018   Sleep apnea    Sleep disorder 09/27/2012   Smoking 11/20/2017   Vitamin D deficiency 02/24/2014    Past Surgical History:  Procedure Laterality Date   BRAIN SURGERY  01/06/1985   BRAIN SURGERY Right 1986   "right temporal lobe"   BREAST BIOPSY Right 12/04/2005   BREAST BIOPSY Left 07/21/2022   [name] LT BREAST BX W LOC DEV 1ST LESION IMG BX SPEC US GUIDE 07/21/2022 [facility]   CHOLECYSTECTOMY     CYST REMOVAL TRUNK     ears     EYE SURGERY     cyst removed   GALLBLADDER SURGERY N/A 2009   NECK SURGERY     SHOULDER SURGERY Bilateral 2009, 2010   UPPER GI ENDOSCOPY  10/2020   VULVECTOMY N/A 07/04/2018   Procedure: WIDE EXCISION VULVECTOMY;  Surgeon: Waynard Reeds, MD;  Location: [facility];  Service: Gynecology;  Laterality: N/A;    Current Medications: Current Meds  Medication Sig   albuterol (PROVENTIL HFA;VENTOLIN HFA) 108 (90 Base) MCG/ACT inhaler Inhale 2 puffs into the lungs every 6 (six) hours as needed for wheezing or shortness of breath.   ALPRAZolam (XANAX) 0.5 MG tablet Take 0.5 mg by mouth 3 (three) times daily as needed for anxiety.  clindamycin (CLEOCIN) 150 MG capsule Take 1 capsule by mouth 3 (three) times daily.   clobetasol ointment (TEMOVATE) 0.05 % Apply 1 application topically 2 (two) times daily.   denosumab (PROLIA) 60 MG/ML SOSY injection Inject 60 mg into the skin every 6 (six) months.   dicyclomine (BENTYL) 10 MG capsule Take 10 mg by mouth every 6 (six) hours as needed for spasms. For abdominal pain   Evolocumab (REPATHA SURECLICK) 140 MG/ML SOAJ INJECT 140 MG INTO THE SKIN EVERY 14 (FOURTEEN) DAYS.   famotidine (PEPCID) 20 MG tablet Take 40 mg by mouth daily.   Fexofenadine-Pseudoephedrine (ALLEGRA-D 12 HOUR PO) Take 1 tablet by mouth every  other day.   fluconazole (DIFLUCAN) 150 MG tablet Take 150 mg by mouth every other day.   HYDROcodone-acetaminophen (NORCO/VICODIN) 5-325 MG per tablet Take 1 tablet by mouth as needed for moderate pain or severe pain (Up tp 4 times a day).   lansoprazole (PREVACID) 30 MG capsule Take 30 mg by mouth daily.    linaclotide (LINZESS) 145 MCG CAPS capsule Take 145 mcg by mouth daily before breakfast.   mometasone (NASONEX) 50 MCG/ACT nasal spray Place 1 spray into the nose 2 (two) times daily.   naloxone (NARCAN) nasal spray 4 mg/0.1 mL Place 0.4 mg into the nose once.   neomycin-bacitracin-polymyxin [phone] OINT Apply 1 application. topically 3 (three) times daily. 3.5   nitroGLYCERIN (NITROSTAT) 0.4 MG SL tablet Place 1 tablet (0.4 mg total) under the tongue every 5 (five) minutes as needed for chest pain.   omeprazole (PRILOSEC) 20 MG capsule Take 40 mg by mouth as needed (indigestion).   spironolactone-hydrochlorothiazide (ALDACTAZIDE) 25-25 MG tablet Take 1 tablet by mouth daily.   terconazole (TERAZOL 3) 0.8 % vaginal cream Place 1 applicator vaginally at bedtime.   tiotropium (SPIRIVA) 18 MCG inhalation capsule Place 18 mcg into inhaler and inhale as needed (sob or wheezing).   tiZANidine (ZANAFLEX) 4 MG capsule Take 4 mg by mouth 3 (three) times daily.   VIVELLE-DOT 0.05 MG/24HR Place 1 patch onto the skin 2 (two) times a week.      Allergies:   Adenosine, Azithromycin, Alendronate, Aleve [naproxen sodium], Atorvastatin, Celecoxib, Clonazepam, Clozapine, Crestor [rosuvastatin], Erythromycin base, Feldene [piroxicam], Keflex [cephalexin], Levetiracetam, Mobic [meloxicam], Neurontin [gabapentin], Other, Penicillins, Simvastatin, Tape, Vioxx [rofecoxib], and Zetia [ezetimibe]   Social History   Socioeconomic History   Marital status: Single    Spouse name: Not on file   Number of children: 0   Years of education: 8   Highest education level: Not on file  Occupational History    Occupation: Disabled  Tobacco Use   Smoking status: Every Day    Current packs/day: 1.50    Types: Cigarettes   Smokeless tobacco: Former    Types: Engineer, drilling   Vaping status: Former  Substance and Sexual Activity   Alcohol use: No   Drug use: No   Sexual activity: Not on file  Other Topics Concern   Not on file  Social History Narrative   Patient lives at home with  mother. Patient has no children. Patient is single. Patient has a 8th grade education. Patient is disabled   Social Drivers of Corporate investment banker Strain: Not on file  Food Insecurity: Not on file  Transportation Needs: Not on file  Physical Activity: Not on file  Stress: Not on file  Social Connections: Not on file     Family History: The patient's family history includes Cancer  in her father and mother; Cirrhosis in her sister; Diabetes in her father; Heart failure in her father; Hypertension in her father and mother; Rheum arthritis in her mother; Stroke in her mother; Thyroid disease in her mother. ROS:   Please see the history of present illness.    All 14 point review of systems negative except as described per history of present illness  EKGs/Labs/Other Studies Reviewed:         Recent Labs: 01/10/2023: ALT 5 02/02/2023: BUN 12; Creatinine, Ser 1.39; Potassium 4.7; Sodium 137  Recent Lipid Panel    Component Value Date/Time   CHOL 133 01/10/2023 1347   TRIG 158 (H) 01/10/2023 1347   HDL 47 01/10/2023 1347   CHOLHDL 2.8 01/10/2023 1347   LDLCALC 59 01/10/2023 1347    Physical Exam:    VS:  BP 114/76   Pulse 100   Ht 5\' 1"  (1.549 m)   Wt 101 lb 9.6 oz (46.1 kg)   SpO2 99%   BMI 19.20 kg/m     Wt Readings from Last 3 Encounters:  09/11/23 101 lb 9.6 oz (46.1 kg)  04/12/23 105 lb 3.2 oz (47.7 kg)  01/10/23 102 lb 6.4 oz (46.4 kg)     GEN:  Well nourished, well developed in no acute distress HEENT: Normal NECK: No JVD; No carotid bruits LYMPHATICS: No  lymphadenopathy CARDIAC: RRR, no murmurs, no rubs, no gallops RESPIRATORY:  Clear to auscultation without rales, wheezing or rhonchi  ABDOMEN: Soft, non-tender, non-distended MUSCULOSKELETAL:  No edema; No deformity  SKIN: Warm and dry LOWER EXTREMITIES: no swelling NEUROLOGIC:  Alert and oriented x 3 PSYCHIATRIC:  Normal affect   ASSESSMENT:    1. Peripheral vascular disease, unspecified (HCC) carotic   disease up to 59%   2. Essential hypertension   3. Dyslipidemia   4. Smoking    PLAN:    In order of problems listed above:  Peripheral vascular disease with scheduled to have another carotic ultrasounds, will also schedule her to have arterial duplex evaluation of lower extremities because of claudications. Essential hypertension blood pressure well-controlled continue present management. Dyslipidemia: She is taking PCSK9 agent, will recheck her fasting lipid profile Smoking: Obviously still a problem she understands she is trying to work on it.   Medication Adjustments/Labs and Tests Ordered: Current medicines are reviewed at length with the patient today.  Concerns regarding medicines are outlined above.  No orders of the defined types were placed in this encounter.  Medication changes: No orders of the defined types were placed in this encounter.   Signed, Georgeanna Lea, MD, [facility] 09/11/2023 1:21 PM    [facility]

## 2023-09-11 NOTE — Addendum Note (Signed)
 Addended by: Baldo Ash D on: 09/11/2023 01:41 PM   Modules accepted: Orders

## 2023-09-11 NOTE — Patient Instructions (Signed)
 Medication Instructions:  Your physician recommends that you continue on your current medications as directed. Please refer to the Current Medication list given to you today.  *If you need a refill on your cardiac medications before your next appointment, please call your pharmacy*   Lab Work: Lipid, CMP, TSH, CBC-today If you have labs (blood work) drawn today and your tests are completely normal, you will receive your results only by: MyChart Message (if you have MyChart) OR A paper copy in the mail If you have any lab test that is abnormal or we need to change your treatment, we will call you to review the results.   Testing/Procedures: Your physician has requested that you have a carotid duplex. This test is an ultrasound of the carotid arteries in your neck. It looks at blood flow through these arteries that supply the brain with blood. Allow one hour for this exam. There are no restrictions or special instructions.   Your physician has requested that you have a lower or upper extremity arterial duplex. This test is an ultrasound of the arteries in the legs or arms. It looks at arterial blood flow in the legs and arms. Allow one hour for Lower and Upper Arterial scans. There are no restrictions or special instructions.  Your physician has requested that you have an ankle brachial index (ABI). During this test an ultrasound and blood pressure cuff are used to evaluate the arteries that supply the arms and legs with blood. Allow thirty minutes for this exam. There are no restrictions or special instructions.  Please note: We ask at that you not bring children with you during ultrasound (echo/ vascular) testing. Due to room size and safety concerns, children are not allowed in the ultrasound rooms during exams. Our front office staff cannot provide observation of children in our lobby area while testing is being conducted. An adult accompanying a patient to their appointment will only be  allowed in the ultrasound room at the discretion of the ultrasound technician under special circumstances. We apologize for any inconvenience.     Follow-Up: At [facility], you and your health needs are our priority.  As part of our continuing mission to provide you with exceptional heart care, we have created designated Provider Care Teams.  These Care Teams include your primary Cardiologist (physician) and Advanced Practice Providers (APPs -  Physician Assistants and Nurse Practitioners) who all work together to provide you with the care you need, when you need it.  We recommend signing up for the patient portal called "MyChart".  Sign up information is provided on this After Visit Summary.  MyChart is used to connect with patients for Virtual Visits (Telemedicine).  Patients are able to view lab/test results, encounter notes, upcoming appointments, etc.  Non-urgent messages can be sent to your provider as well.   To learn more about what you can do with MyChart, go to [email or web address].    Your next appointment:   5 month(s)  The format for your next appointment:   In Person  Provider:   Gypsy Balsam, MD    Other Instructions NA

## 2023-09-12 LAB — COMPREHENSIVE METABOLIC PANEL
ALT: 9 IU/L (ref 0–32)
AST: 16 IU/L (ref 0–40)
Albumin: 4.7 g/dL (ref 3.8–4.9)
Alkaline Phosphatase: 71 IU/L (ref 44–121)
BUN/Creatinine Ratio: 17 (ref 9–23)
BUN: 20 mg/dL (ref 6–24)
Bilirubin Total: <0.2 mg/dL (ref 0.0–1.2)
CO2: 19 mmol/L — ABNORMAL LOW (ref 20–29)
Calcium: 9.6 mg/dL (ref 8.7–10.2)
Chloride: 99 mmol/L (ref 96–106)
Creatinine, Ser: 1.17 mg/dL — ABNORMAL HIGH (ref 0.57–1.00)
Globulin, Total: 2.4 g/dL (ref 1.5–4.5)
Glucose: 70 mg/dL (ref 70–99)
Potassium: 4.7 mmol/L (ref 3.5–5.2)
Sodium: 136 mmol/L (ref 134–144)
Total Protein: 7.1 g/dL (ref 6.0–8.5)
eGFR: 55 mL/min/{1.73_m2} — ABNORMAL LOW (ref >59)

## 2023-09-12 LAB — CBC
Hematocrit: 43.9 % (ref 34.0–46.6)
Hemoglobin: 15.1 g/dL (ref 11.1–15.9)
MCH: 33.7 pg — ABNORMAL HIGH (ref 26.6–33.0)
MCHC: 34.4 g/dL (ref 31.5–35.7)
MCV: 98 fL — ABNORMAL HIGH (ref 79–97)
Platelets: 493 10*3/uL — ABNORMAL HIGH (ref 150–450)
RBC: 4.48 x10E6/uL (ref 3.77–5.28)
RDW: 13.2 % (ref 11.7–15.4)
WBC: 7.4 10*3/uL (ref 3.4–10.8)

## 2023-09-12 LAB — LIPID PANEL
Chol/HDL Ratio: 3.1 ratio (ref 0.0–4.4)
Cholesterol, Total: 135 mg/dL (ref 100–199)
HDL: 44 mg/dL (ref >39)
LDL Chol Calc (NIH): 67 mg/dL (ref 0–99)
Triglycerides: 137 mg/dL (ref 0–149)
VLDL Cholesterol Cal: 24 mg/dL (ref 5–40)

## 2023-09-12 LAB — TSH: TSH: 1.21 u[IU]/mL (ref 0.450–4.500)

## 2023-10-02 ENCOUNTER — Encounter

## 2023-10-05 ENCOUNTER — Telehealth: Payer: Self-pay

## 2023-10-05 DIAGNOSIS — D691 Qualitative platelet defects: Secondary | ICD-10-CM

## 2023-10-05 NOTE — Telephone Encounter (Signed)
 Spoke with pt about lab results and referral to hematology. Pt verbalized understanding and had no further questions. Routed to PCP.

## 2023-10-09 ENCOUNTER — Telehealth: Payer: Self-pay | Admitting: Oncology

## 2023-10-09 DIAGNOSIS — M961 Postlaminectomy syndrome, not elsewhere classified: Secondary | ICD-10-CM | POA: Diagnosis not present

## 2023-10-09 DIAGNOSIS — G894 Chronic pain syndrome: Secondary | ICD-10-CM | POA: Diagnosis not present

## 2023-10-09 DIAGNOSIS — Z79899 Other long term (current) drug therapy: Secondary | ICD-10-CM | POA: Diagnosis not present

## 2023-10-09 DIAGNOSIS — M5137 Other intervertebral disc degeneration, lumbosacral region with discogenic back pain only: Secondary | ICD-10-CM | POA: Diagnosis not present

## 2023-10-09 NOTE — Telephone Encounter (Signed)
 Patient has been scheduled for follow-up visit per 10/04/23 LOS.  Pt aware of scheduled appt details.

## 2023-10-11 ENCOUNTER — Ambulatory Visit: Attending: Cardiology

## 2023-10-11 ENCOUNTER — Ambulatory Visit (INDEPENDENT_AMBULATORY_CARE_PROVIDER_SITE_OTHER)

## 2023-10-11 DIAGNOSIS — I739 Peripheral vascular disease, unspecified: Secondary | ICD-10-CM

## 2023-10-11 DIAGNOSIS — I6523 Occlusion and stenosis of bilateral carotid arteries: Secondary | ICD-10-CM | POA: Diagnosis not present

## 2023-10-12 LAB — VAS US ABI WITH/WO TBI
Left ABI: 1.13
Right ABI: 1.04

## 2023-10-15 ENCOUNTER — Encounter

## 2023-10-17 ENCOUNTER — Telehealth: Payer: Self-pay

## 2023-10-17 NOTE — Telephone Encounter (Signed)
Marland Kitchen  jpn

## 2023-10-17 NOTE — Telephone Encounter (Signed)
-----   Message from Ralene Burger sent at 10/16/2023  8:29 AM EDT ----- Normal circulation EXTR lower extremities

## 2023-10-17 NOTE — Telephone Encounter (Signed)
 Patient notified of results and verbalized understanding.

## 2023-10-17 NOTE — Telephone Encounter (Signed)
-----   Message from Ralene Burger sent at 10/16/2023  8:28 AM EDT ----- Carotic ultrasound show up to 39% stenosis bilaterally which is only mild disease

## 2023-10-18 ENCOUNTER — Encounter

## 2023-10-18 DIAGNOSIS — M810 Age-related osteoporosis without current pathological fracture: Secondary | ICD-10-CM | POA: Diagnosis not present

## 2023-10-24 ENCOUNTER — Telehealth: Payer: Self-pay | Admitting: Oncology

## 2023-10-24 ENCOUNTER — Inpatient Hospital Stay: Attending: Oncology | Admitting: Oncology

## 2023-10-24 ENCOUNTER — Encounter: Payer: Self-pay | Admitting: Oncology

## 2023-10-24 ENCOUNTER — Inpatient Hospital Stay

## 2023-10-24 ENCOUNTER — Other Ambulatory Visit: Payer: Self-pay | Admitting: Oncology

## 2023-10-24 VITALS — BP 108/74 | HR 77 | Temp 97.5°F | Resp 14 | Ht 61.0 in | Wt 103.4 lb

## 2023-10-24 DIAGNOSIS — Z79899 Other long term (current) drug therapy: Secondary | ICD-10-CM | POA: Diagnosis not present

## 2023-10-24 DIAGNOSIS — Z7951 Long term (current) use of inhaled steroids: Secondary | ICD-10-CM | POA: Diagnosis not present

## 2023-10-24 DIAGNOSIS — M8580 Other specified disorders of bone density and structure, unspecified site: Secondary | ICD-10-CM | POA: Insufficient documentation

## 2023-10-24 DIAGNOSIS — D509 Iron deficiency anemia, unspecified: Secondary | ICD-10-CM

## 2023-10-24 DIAGNOSIS — R7989 Other specified abnormal findings of blood chemistry: Secondary | ICD-10-CM

## 2023-10-24 DIAGNOSIS — E611 Iron deficiency: Secondary | ICD-10-CM | POA: Insufficient documentation

## 2023-10-24 DIAGNOSIS — D75839 Thrombocytosis, unspecified: Secondary | ICD-10-CM | POA: Diagnosis not present

## 2023-10-24 LAB — CMP (CANCER CENTER ONLY)
ALT: 6 U/L (ref 0–44)
AST: 17 U/L (ref 15–41)
Albumin: 4.7 g/dL (ref 3.5–5.0)
Alkaline Phosphatase: 71 U/L (ref 38–126)
Anion gap: 14 (ref 5–15)
BUN: 26 mg/dL — ABNORMAL HIGH (ref 6–20)
CO2: 22 mmol/L (ref 22–32)
Calcium: 10.4 mg/dL — ABNORMAL HIGH (ref 8.9–10.3)
Chloride: 103 mmol/L (ref 98–111)
Creatinine: 1.19 mg/dL — ABNORMAL HIGH (ref 0.44–1.00)
GFR, Estimated: 53 mL/min — ABNORMAL LOW (ref >60)
Glucose, Bld: 96 mg/dL (ref 70–99)
Potassium: 4.4 mmol/L (ref 3.5–5.1)
Sodium: 138 mmol/L (ref 135–145)
Total Bilirubin: <0.2 mg/dL (ref 0.0–1.2)
Total Protein: 7.9 g/dL (ref 6.5–8.1)

## 2023-10-24 LAB — CBC WITH DIFFERENTIAL (CANCER CENTER ONLY)
Abs Immature Granulocytes: 0.03 10*3/uL (ref 0.00–0.07)
Basophils Absolute: 0.1 10*3/uL (ref 0.0–0.1)
Basophils Relative: 1 %
Eosinophils Absolute: 0.2 10*3/uL (ref 0.0–0.5)
Eosinophils Relative: 2 %
HCT: 41.7 % (ref 36.0–46.0)
Hemoglobin: 14.3 g/dL (ref 12.0–15.0)
Immature Granulocytes: 0 %
Lymphocytes Relative: 17 %
Lymphs Abs: 1.8 10*3/uL (ref 0.7–4.0)
MCH: 34 pg (ref 26.0–34.0)
MCHC: 34.3 g/dL (ref 30.0–36.0)
MCV: 99 fL (ref 80.0–100.0)
Monocytes Absolute: 0.6 10*3/uL (ref 0.1–1.0)
Monocytes Relative: 6 %
Neutro Abs: 7.9 10*3/uL — ABNORMAL HIGH (ref 1.7–7.7)
Neutrophils Relative %: 74 %
Platelet Count: 464 10*3/uL — ABNORMAL HIGH (ref 150–400)
RBC: 4.21 MIL/uL (ref 3.87–5.11)
RDW: 13.7 % (ref 11.5–15.5)
WBC Count: 10.6 10*3/uL — ABNORMAL HIGH (ref 4.0–10.5)
nRBC: 0 % (ref 0.0–0.2)
nRBC: 0 /100{WBCs}

## 2023-10-24 LAB — IRON AND TIBC
Iron: 58 ug/dL (ref 28–170)
Saturation Ratios: 13 % (ref 10.4–31.8)
TIBC: 451 ug/dL — ABNORMAL HIGH (ref 250–450)
UIBC: 393 ug/dL

## 2023-10-24 LAB — FERRITIN: Ferritin: 10 ng/mL — ABNORMAL LOW (ref 11–307)

## 2023-10-24 NOTE — Telephone Encounter (Signed)
 Patient has been scheduled for follow-up visit per 10/24/23 LOS.  Pt noted appt details on personal planner/calendar.

## 2023-10-24 NOTE — Progress Notes (Unsigned)
 [facility]  [address] [place],  [place]  [address] [phone]  Clinic Day:  10/24/2023  Referring physician: Freya Jesus, *   HISTORY OF PRESENT ILLNESS:  The patient is a 58 y.o. female who I was asked to consult upon for elevated platelets.  Labs in late March 2025 at her primary care office showed an elevated platelet count of 493.  According to the patient, she had never been told before to have elevated platelets.  He denies having undergone a previous splenectomy.  She also denies having had any recent trauma or infection that could have led to a reactive rise in her platelets.  To her knowledge, there is no history of iron deficiency anemia.  She claims to have a first cousin with anemia; otherwise, there is no other family history of any hematologic disorders.  Overall, the patient denies there being any particular changes in her health over these past few months.  PAST MEDICAL HISTORY:   Past Medical History:  Diagnosis Date   Anxiety    Anxiety disorder 08/19/2013   Atypical chest pain    Barrett esophagus    Brachial neuritis 11/23/2012   Cancer (HCC)    VULVAR   Carpal tunnel syndrome    right and left wrists   Cervical post-laminectomy syndrome 08/19/2013   Chronic headache disorder 05/08/2016   Chronic neck pain 03/15/2015   DDD (degenerative disc disease), lumbosacral 08/19/2013   Disorder of bone and cartilage 04/21/2013   Dyslipidemia 01/21/2015   Dysrhythmia    Encounter for therapeutic drug monitoring 05/01/2019   Epilepsy (HCC) 02/24/2014   Essential hypertension 11/20/2017   GERD (gastroesophageal reflux disease)    Headache 03/09/2014   Headache(784.0) 03/09/2014   Heart murmur    Hiatal hernia    Hypercholesterolemia 11/23/2012   Hyperlipidemia    Hypertension    Low back pain 11/23/2012   Lumbar radicular pain 04/21/2013   Myalgia and myositis 08/19/2013   Osteoarthritis of spine with  radiculopathy, 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/15/2015   Osteopenia    Peripheral vascular disease, unspecified (HCC) carotic   disease up to 59% 07/14/2022   Seizure disorder (HCC) 08/19/2013   Seizures (HCC) 09/27/2012   Shingles    Sinus tachycardia 08/16/2018   Sleep apnea    Sleep disorder 09/27/2012   Smoking 11/20/2017   Vitamin D deficiency 02/24/2014    PAST SURGICAL HISTORY:   Past Surgical History:  Procedure Laterality Date   ABDOMINAL HERNIA REPAIR Left    ADENOIDECTOMY     BRAIN SURGERY Right 1986   "right temporal lobe"   BREAST BIOPSY Right 12/04/2005   BREAST BIOPSY Left 07/21/2022   US  LT BREAST BX W LOC DEV 1ST LESION IMG BX SPEC US  GUIDE 07/21/2022 [facility]   CYST REMOVAL TRUNK     EYE SURGERY Left    cyst removed   GALLBLADDER SURGERY N/A 2009   INNER EAR SURGERY Bilateral    MYRINGOTOMY Bilateral    NECK SURGERY     C6-C7 FUSION   SHOULDER SURGERY Bilateral 2009, 2010   REPAIR   TOTAL ABDOMINAL HYSTERECTOMY     UPPER GI ENDOSCOPY  10/2020   VULVECTOMY N/A 07/04/2018   Procedure: WIDE EXCISION VULVECTOMY;  Surgeon: Reggy Capers, MD;  Location: [facility];  Service: Gynecology;  Laterality: N/A;   CURRENT MEDICATIONS:   Current Outpatient Medications  Medication Sig Dispense Refill   albuterol (PROVENTIL HFA;VENTOLIN HFA) 108 (90 Base) MCG/ACT inhaler Inhale 2  puffs into the lungs every 6 (six) hours as needed for wheezing or shortness of breath.     ALPRAZolam  (XANAX ) 0.5 MG tablet Take 0.5 mg by mouth 3 (three) times daily as needed for anxiety.     clindamycin (CLEOCIN) 150 MG capsule Take 1 capsule by mouth 3 (three) times daily.     clobetasol ointment (TEMOVATE) 0.05 % Apply 1 application topically 2 (two) times daily.     denosumab  (PROLIA ) 60 MG/ML SOSY injection Inject 60 mg into the skin every 6 (six) months.     dicyclomine (BENTYL) 10 MG capsule Take 10 mg by mouth every 6 (six) hours as needed for spasms. For abdominal pain     Evolocumab   (REPATHA  SURECLICK) 140 MG/ML SOAJ INJECT 140 MG INTO THE SKIN EVERY 14 (FOURTEEN) DAYS. 6 mL 2   famotidine (PEPCID) 20 MG tablet Take 40 mg by mouth daily.     Fexofenadine-Pseudoephedrine (ALLEGRA-D 12 HOUR PO) Take 1 tablet by mouth every other day.     fluconazole (DIFLUCAN) 150 MG tablet Take 150 mg by mouth every other day.     HYDROcodone-acetaminophen (NORCO/VICODIN) 5-325 MG per tablet Take 1 tablet by mouth as needed for moderate pain or severe pain (Up tp 4 times a day).     lansoprazole (PREVACID) 30 MG capsule Take 30 mg by mouth daily.      linaclotide (LINZESS) 145 MCG CAPS capsule Take 145 mcg by mouth daily before breakfast.     mometasone (NASONEX) 50 MCG/ACT nasal spray Place 1 spray into the nose 2 (two) times daily.     naloxone (NARCAN) nasal spray 4 mg/0.1 mL Place 0.4 mg into the nose once.     neomycin-bacitracin-polymyxin [id number] OINT Apply 1 application. topically 3 (three) times daily. 3.5     nitroGLYCERIN  (NITROSTAT ) 0.4 MG SL tablet Place 1 tablet (0.4 mg total) under the tongue every 5 (five) minutes as needed for chest pain. 25 tablet 6   omeprazole (PRILOSEC) 20 MG capsule Take 40 mg by mouth as needed (indigestion).     promethazine  (PHENERGAN ) 25 MG tablet Take 12.5 mg by mouth every 8 (eight) hours as needed for nausea or vomiting.     spironolactone -hydrochlorothiazide (ALDACTAZIDE) 25-25 MG tablet Take 1 tablet by mouth daily. 90 tablet 3   terconazole (TERAZOL 3) 0.8 % vaginal cream Place 1 applicator vaginally at bedtime.     tiotropium (SPIRIVA) 18 MCG inhalation capsule Place 18 mcg into inhaler and inhale as needed (sob or wheezing).     tiZANidine (ZANAFLEX) 4 MG capsule Take 4 mg by mouth 3 (three) times daily.     VIVELLE-DOT 0.05 MG/24HR Place 1 patch onto the skin 2 (two) times a week.      No current facility-administered medications for this visit.    ALLERGIES:   Allergies  Allergen Reactions   Adenosine Anaphylaxis    Heart block    Azithromycin Swelling   Alendronate Other (See Comments)   Aleve [Naproxen Sodium]    Atorvastatin Other (See Comments)    irritability   Celecoxib Other (See Comments)   Clonazepam      Other reaction(s): Delusions (intolerance)   Clozapine Other (See Comments)   Crestor  [Rosuvastatin ]     myalgias   Erythromycin Base Hives and Swelling   Feldene [Piroxicam]    Keflex [Cephalexin]    Levetiracetam Other (See Comments)   Mobic [Meloxicam]    Neurontin [Gabapentin]    Other     Any anti-inflammatory  Penicillins Other (See Comments)    Welts  DID THE REACTION INVOLVE: Swelling of the face/tongue/throat, SOB, or low BP? Unknown  Sudden or severe rash/hives, skin peeling, or the inside of the mouth or nose? Unknown  Did it require medical treatment? Unknown  When did it last happen?   Childhood allergy     If all above answers are "NO", may proceed with cephalosporin use.   Simvastatin     myalgias   Tape     Medical adhesive tape   Vioxx [Rofecoxib]    Zetia  [Ezetimibe ]     constipation   FAMILY HISTORY:   Family History  Problem Relation Age of Onset   Other Mother        LYMPHOMATOID PAPULOSIS; LYMPHOMA; LUNG MASS   Thyroid  disease Mother    Rheum arthritis Mother    Stroke Mother    Hypertension Mother    Hypertension Father    Melanoma Father    Diabetes Father    Heart failure Father    Alcohol abuse Half-Sister        DIED FROM ALCOHOLIC CIRRHOISIS   Diabetes Brother    SOCIAL HISTORY:  The patient was born and raised in [place].  She lives in the [place] community with her mother.  She is single, with no children.  She previously did furniture  and textile work.  She has smoked a pack and a half of cigarettes daily for 45 years.  There is no history of alcohol abuse.  REVIEW OF SYSTEMS:  Review of Systems  Constitutional:  Negative for fatigue and fever.  HENT:   Negative for hearing loss and sore throat.   Eyes:  Negative for eye  problems.  Respiratory:  Negative for chest tightness, cough and hemoptysis.   Cardiovascular:  Positive for chest pain. Negative for palpitations.  Gastrointestinal:  Positive for abdominal pain. Negative for abdominal distention, blood in stool, constipation, diarrhea, nausea and vomiting.  Endocrine: Negative for hot flashes.  Genitourinary:  Negative for difficulty urinating, dysuria, frequency, hematuria and nocturia.   Musculoskeletal:  Positive for arthralgias and gait problem. Negative for back pain and myalgias.  Skin: Negative.  Negative for itching and rash.  Neurological:  Positive for gait problem and headaches. Negative for dizziness, extremity weakness, light-headedness and numbness.  Hematological: Negative.   Psychiatric/Behavioral:  Negative for depression and suicidal ideas. The patient is nervous/anxious.    PHYSICAL EXAM:  Blood pressure 108/74, pulse 77, temperature (!) 97.5 F (36.4 C), temperature source Oral, resp. rate 14, height 5\' 1"  (1.549 m), weight 103 lb 6.4 oz (46.9 kg), SpO2 97%. Wt Readings from Last 3 Encounters:  10/24/23 103 lb 6.4 oz (46.9 kg)  09/11/23 101 lb 9.6 oz (46.1 kg)  04/12/23 105 lb 3.2 oz (47.7 kg)   Body mass index is 19.54 kg/m. Performance status (ECOG): 1 - Symptomatic but completely ambulatory Physical Exam Constitutional:      Appearance: Normal appearance. She is not ill-appearing.  HENT:     Mouth/Throat:     Mouth: Mucous membranes are moist.     Pharynx: Oropharynx is clear. No oropharyngeal exudate or posterior oropharyngeal erythema.  Cardiovascular:     Rate and Rhythm: Normal rate and regular rhythm.     Heart sounds: No murmur heard.    No friction rub. No gallop.  Pulmonary:     Effort: Pulmonary effort is normal. No respiratory distress.     Breath sounds: Normal breath sounds. No wheezing, rhonchi or  rales.  Abdominal:     General: Bowel sounds are normal. There is no distension.     Palpations: Abdomen is  soft. There is no mass.     Tenderness: There is no abdominal tenderness.  Musculoskeletal:        General: No swelling.     Right lower leg: No edema.     Left lower leg: No edema.  Lymphadenopathy:     Cervical: No cervical adenopathy.     Upper Body:     Right upper body: No supraclavicular or axillary adenopathy.     Left upper body: No supraclavicular or axillary adenopathy.     Lower Body: No right inguinal adenopathy. No left inguinal adenopathy.  Skin:    General: Skin is warm.     Coloration: Skin is not jaundiced.     Findings: No lesion or rash.  Neurological:     General: No focal deficit present.     Mental Status: She is alert and oriented to person, place, and time. Mental status is at baseline.  Psychiatric:        Mood and Affect: Mood normal.        Behavior: Behavior normal.        Thought Content: Thought content normal.    LABS:      Latest Ref Rng & Units 10/24/2023   10:21 AM 09/11/2023    1:36 PM 06/28/2018    2:59 PM  CBC  WBC 4.0 - 10.5 K/uL 10.6  7.4  9.3   Hemoglobin 12.0 - 15.0 g/dL 81.1  91.4  78.2   Hematocrit 36.0 - 46.0 % 41.7  43.9  45.4   Platelets 150 - 400 K/uL 464  493  333       Latest Ref Rng & Units 10/24/2023   10:21 AM 09/11/2023    1:36 PM 02/02/2023    2:24 PM  CMP  Glucose 70 - 99 mg/dL 96  70  75   BUN 6 - 20 mg/dL 26  20  12    Creatinine 0.44 - 1.00 mg/dL 9.56  2.13  0.86   Sodium 135 - 145 mmol/L 138  136  137   Potassium 3.5 - 5.1 mmol/L 4.4  4.7  4.7   Chloride 98 - 111 mmol/L 103  99  103   CO2 22 - 32 mmol/L 22  19  20    Calcium  8.9 - 10.3 mg/dL 57.8  9.6  9.5   Total Protein 6.5 - 8.1 g/dL 7.9  7.1    Total Bilirubin 0.0 - 1.2 mg/dL <4.6  <9.6    Alkaline Phos 38 - 126 U/L 71  71    AST 15 - 41 U/L 17  16    ALT 0 - 44 U/L 6  9      Latest Reference Range & Units 10/24/23 10:21  Iron 28 - 170 ug/dL 58  UIBC ug/dL 295  TIBC 284 - 132 ug/dL 440 (H)  Saturation Ratios 10.4 - 31.8 % 13  Ferritin 11 - 307 ng/mL 10  (L)  (H): Data is abnormally high (L): Data is abnormally low  ASSESSMENT & PLAN:  A 58 y.o. female who I was asked to consult upon for elevated platelets.  She will be screened for a potential myeloproliferative disorder, such as essential thrombocythemia, today.  This will include checking JAK2, MPL, and calreticulin mutations.  However, although she has a completely normal hemoglobin of 14.3, her labs today clearly show that  she is iron deficient.  Iron deficiency can commonly lead to a reactive thrombocythemia developing, which resolves over time after one's iron stores are repleted.  Based upon this, I will arrange for her to receive IV iron over these next few weeks to replenish her iron stores, which will hopefully lead to a normalization of her platelets.  Furthermore, based upon her iron deficiency, this patient likely needs a GI workup, if she has not had one in the past 5 years, to ensure there is no occult GI tract pathology present.  I will see this patient back in 2 months to reassess her peripheral counts to see how well the responded to her upcoming IV iron.  The patient understands all the plans discussed today and is in agreement with them.  I do appreciate Street, Renford Cartwright, * for his new consult.   Jalonda Antigua Felicia Horde, MD

## 2023-10-25 ENCOUNTER — Telehealth: Payer: Self-pay

## 2023-10-25 DIAGNOSIS — E611 Iron deficiency: Secondary | ICD-10-CM | POA: Insufficient documentation

## 2023-10-25 DIAGNOSIS — R7989 Other specified abnormal findings of blood chemistry: Secondary | ICD-10-CM | POA: Insufficient documentation

## 2023-10-25 NOTE — Telephone Encounter (Signed)
 Dr Harles Lied:  Crystal Lyons, please let this patient know that her labs yesterday unexpectedly show her to be iron deficient. She needs to be set up for IV iron ASAP. Also, ask the patient if she has had a colonoscopy within the past 5 years. If not, let me know so I can have Tammy set her up for a GI evaluation. THX

## 2023-10-29 DIAGNOSIS — R109 Unspecified abdominal pain: Secondary | ICD-10-CM | POA: Diagnosis not present

## 2023-10-29 DIAGNOSIS — K219 Gastro-esophageal reflux disease without esophagitis: Secondary | ICD-10-CM | POA: Diagnosis not present

## 2023-10-29 DIAGNOSIS — K259 Gastric ulcer, unspecified as acute or chronic, without hemorrhage or perforation: Secondary | ICD-10-CM | POA: Diagnosis not present

## 2023-10-29 DIAGNOSIS — K649 Unspecified hemorrhoids: Secondary | ICD-10-CM | POA: Diagnosis not present

## 2023-10-29 DIAGNOSIS — D75839 Thrombocytosis, unspecified: Secondary | ICD-10-CM | POA: Diagnosis not present

## 2023-10-29 DIAGNOSIS — K5900 Constipation, unspecified: Secondary | ICD-10-CM | POA: Diagnosis not present

## 2023-11-01 ENCOUNTER — Telehealth: Payer: Self-pay | Admitting: Oncology

## 2023-11-01 ENCOUNTER — Encounter: Payer: Self-pay | Admitting: Oncology

## 2023-11-01 DIAGNOSIS — D509 Iron deficiency anemia, unspecified: Secondary | ICD-10-CM | POA: Insufficient documentation

## 2023-11-01 LAB — NGS JAK2 E12-15/CALR/MPL

## 2023-11-01 NOTE — Telephone Encounter (Signed)
 Contacted pt to schedule an appt. Unable to reach via phone, voicemail was left.   Scheduling Message Entered by [place], AMY W on 10/25/2023 at 10:24 AM Priority: High INFUSION 1HR30MIN (90)  Department: [facility]  Provider: Deloria Fetch, MD  Appointment Notes:  Needs IV iron per Harles Lied

## 2023-11-01 NOTE — Telephone Encounter (Signed)
 Patient has been scheduled for follow-up visit per 11/01/23 LOS.  Pt aware of scheduled appt details.

## 2023-11-05 ENCOUNTER — Inpatient Hospital Stay

## 2023-11-05 VITALS — BP 130/76 | HR 76 | Temp 98.0°F | Resp 18

## 2023-11-05 DIAGNOSIS — Z79899 Other long term (current) drug therapy: Secondary | ICD-10-CM | POA: Diagnosis not present

## 2023-11-05 DIAGNOSIS — D75839 Thrombocytosis, unspecified: Secondary | ICD-10-CM | POA: Diagnosis not present

## 2023-11-05 DIAGNOSIS — Z7951 Long term (current) use of inhaled steroids: Secondary | ICD-10-CM | POA: Diagnosis not present

## 2023-11-05 DIAGNOSIS — D509 Iron deficiency anemia, unspecified: Secondary | ICD-10-CM

## 2023-11-05 DIAGNOSIS — M8580 Other specified disorders of bone density and structure, unspecified site: Secondary | ICD-10-CM | POA: Diagnosis not present

## 2023-11-05 DIAGNOSIS — E611 Iron deficiency: Secondary | ICD-10-CM | POA: Diagnosis not present

## 2023-11-05 MED ORDER — ACETAMINOPHEN 325 MG PO TABS
650.0000 mg | ORAL_TABLET | Freq: Once | ORAL | Status: AC
Start: 2023-11-05 — End: 2023-11-05
  Administered 2023-11-05: 650 mg via ORAL
  Filled 2023-11-05: qty 2

## 2023-11-05 MED ORDER — LORATADINE 10 MG PO TABS
10.0000 mg | ORAL_TABLET | Freq: Once | ORAL | Status: AC
  Administered 2023-11-05: 10 mg via ORAL
  Filled 2023-11-05: qty 1

## 2023-11-05 MED ORDER — IRON SUCROSE 20 MG/ML IV SOLN
200.0000 mg | Freq: Once | INTRAVENOUS | Status: AC
  Administered 2023-11-05: 200 mg via INTRAVENOUS
  Filled 2023-11-05: qty 10

## 2023-11-05 NOTE — Patient Instructions (Signed)

## 2023-11-09 ENCOUNTER — Inpatient Hospital Stay

## 2023-11-09 VITALS — BP 111/72 | HR 91 | Temp 98.4°F | Resp 18

## 2023-11-09 DIAGNOSIS — Z79899 Other long term (current) drug therapy: Secondary | ICD-10-CM | POA: Diagnosis not present

## 2023-11-09 DIAGNOSIS — Z7951 Long term (current) use of inhaled steroids: Secondary | ICD-10-CM | POA: Diagnosis not present

## 2023-11-09 DIAGNOSIS — D509 Iron deficiency anemia, unspecified: Secondary | ICD-10-CM

## 2023-11-09 DIAGNOSIS — D75839 Thrombocytosis, unspecified: Secondary | ICD-10-CM | POA: Diagnosis not present

## 2023-11-09 DIAGNOSIS — M8580 Other specified disorders of bone density and structure, unspecified site: Secondary | ICD-10-CM | POA: Diagnosis not present

## 2023-11-09 DIAGNOSIS — E611 Iron deficiency: Secondary | ICD-10-CM | POA: Diagnosis not present

## 2023-11-09 MED ORDER — LORATADINE 10 MG PO TABS: 10.0000 mg | ORAL_TABLET | Freq: Once | ORAL | Status: DC

## 2023-11-09 MED ORDER — IRON SUCROSE 20 MG/ML IV SOLN
200.0000 mg | Freq: Once | INTRAVENOUS | Status: AC
  Administered 2023-11-09: 200 mg via INTRAVENOUS
  Filled 2023-11-09: qty 10

## 2023-11-09 MED ORDER — ACETAMINOPHEN 325 MG PO TABS: 650.0000 mg | ORAL_TABLET | Freq: Once | ORAL | Status: DC

## 2023-11-09 NOTE — Patient Instructions (Signed)

## 2023-11-14 ENCOUNTER — Inpatient Hospital Stay

## 2023-11-14 VITALS — BP 104/65 | HR 71 | Temp 98.3°F | Resp 18

## 2023-11-14 DIAGNOSIS — D75839 Thrombocytosis, unspecified: Secondary | ICD-10-CM | POA: Diagnosis not present

## 2023-11-14 DIAGNOSIS — Z7951 Long term (current) use of inhaled steroids: Secondary | ICD-10-CM | POA: Diagnosis not present

## 2023-11-14 DIAGNOSIS — M8580 Other specified disorders of bone density and structure, unspecified site: Secondary | ICD-10-CM | POA: Diagnosis not present

## 2023-11-14 DIAGNOSIS — E611 Iron deficiency: Secondary | ICD-10-CM | POA: Diagnosis not present

## 2023-11-14 DIAGNOSIS — D509 Iron deficiency anemia, unspecified: Secondary | ICD-10-CM

## 2023-11-14 DIAGNOSIS — Z79899 Other long term (current) drug therapy: Secondary | ICD-10-CM | POA: Diagnosis not present

## 2023-11-14 MED ORDER — LORATADINE 10 MG PO TABS
10.0000 mg | ORAL_TABLET | Freq: Once | ORAL | Status: DC
Start: 2023-11-14 — End: 2023-11-14

## 2023-11-14 MED ORDER — IRON SUCROSE 20 MG/ML IV SOLN
200.0000 mg | Freq: Once | INTRAVENOUS | Status: AC
  Administered 2023-11-14: 200 mg via INTRAVENOUS
  Filled 2023-11-14: qty 10

## 2023-11-14 MED ORDER — ACETAMINOPHEN 325 MG PO TABS
650.0000 mg | ORAL_TABLET | Freq: Once | ORAL | Status: DC
Start: 2023-11-14 — End: 2023-11-14

## 2023-11-14 NOTE — Patient Instructions (Signed)

## 2023-11-16 ENCOUNTER — Inpatient Hospital Stay

## 2023-11-16 VITALS — BP 102/66 | HR 92 | Temp 98.0°F | Resp 18 | Ht 61.0 in | Wt 106.0 lb

## 2023-11-16 DIAGNOSIS — D75839 Thrombocytosis, unspecified: Secondary | ICD-10-CM | POA: Diagnosis not present

## 2023-11-16 DIAGNOSIS — Z79899 Other long term (current) drug therapy: Secondary | ICD-10-CM | POA: Diagnosis not present

## 2023-11-16 DIAGNOSIS — M8580 Other specified disorders of bone density and structure, unspecified site: Secondary | ICD-10-CM | POA: Diagnosis not present

## 2023-11-16 DIAGNOSIS — Z7951 Long term (current) use of inhaled steroids: Secondary | ICD-10-CM | POA: Diagnosis not present

## 2023-11-16 DIAGNOSIS — D509 Iron deficiency anemia, unspecified: Secondary | ICD-10-CM

## 2023-11-16 DIAGNOSIS — E611 Iron deficiency: Secondary | ICD-10-CM | POA: Diagnosis not present

## 2023-11-16 MED ORDER — ACETAMINOPHEN 325 MG PO TABS: 650.0000 mg | ORAL_TABLET | Freq: Once | ORAL | Status: DC

## 2023-11-16 MED ORDER — IRON SUCROSE 20 MG/ML IV SOLN
200.0000 mg | Freq: Once | INTRAVENOUS | Status: AC
  Administered 2023-11-16: 200 mg via INTRAVENOUS
  Filled 2023-11-16: qty 10

## 2023-11-16 MED ORDER — LORATADINE 10 MG PO TABS
10.0000 mg | ORAL_TABLET | Freq: Once | ORAL | Status: DC
Start: 2023-11-16 — End: 2023-11-16

## 2023-11-16 NOTE — Patient Instructions (Signed)

## 2023-11-20 ENCOUNTER — Inpatient Hospital Stay: Attending: Oncology

## 2023-11-20 VITALS — BP 107/66 | HR 90 | Temp 98.1°F | Resp 18

## 2023-11-20 DIAGNOSIS — D509 Iron deficiency anemia, unspecified: Secondary | ICD-10-CM | POA: Insufficient documentation

## 2023-11-20 MED ORDER — ACETAMINOPHEN 325 MG PO TABS
650.0000 mg | ORAL_TABLET | Freq: Once | ORAL | Status: DC
Start: 2023-11-20 — End: 2023-11-20

## 2023-11-20 MED ORDER — LORATADINE 10 MG PO TABS
10.0000 mg | ORAL_TABLET | Freq: Once | ORAL | Status: DC
Start: 2023-11-20 — End: 2023-11-20

## 2023-11-20 MED ORDER — IRON SUCROSE 20 MG/ML IV SOLN
200.0000 mg | Freq: Once | INTRAVENOUS | Status: AC
  Administered 2023-11-20: 200 mg via INTRAVENOUS
  Filled 2023-11-20: qty 10

## 2023-11-20 NOTE — Patient Instructions (Signed)

## 2023-12-23 ENCOUNTER — Other Ambulatory Visit: Payer: Self-pay | Admitting: Oncology

## 2023-12-23 DIAGNOSIS — D509 Iron deficiency anemia, unspecified: Secondary | ICD-10-CM

## 2023-12-23 NOTE — Progress Notes (Signed)
 [facility]  [address] [place],  [place]  [address] [phone]  Clinic Day:  12/24/2023  Referring physician: Rusty Lonni HERO, *   HISTORY OF PRESENT ILLNESS:  The patient is a 58 y.o. 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  She comes in today to reassess her labs after receiving IV iron  in May/June 2025.  She claims to feel better since she received her IV iron .     PHYSICAL EXAM:  Blood pressure 114/75, pulse 75, temperature 98.9 F (37.2 C), temperature source Oral, resp. rate 18, height 5' 1 (1.549 m), weight 104 lb 1.6 oz (47.2 kg), SpO2 100%. Wt Readings from Last 3 Encounters:  12/24/23 104 lb 1.6 oz (47.2 kg)  11/16/23 106 lb (48.1 kg)  10/24/23 103 lb 6.4 oz (46.9 kg)   Body mass index is 19.67 kg/m. Performance status (ECOG): 1 - Symptomatic but completely ambulatory Physical Exam Constitutional:      Appearance: Normal appearance. She is not ill-appearing.  HENT:     Mouth/Throat:     Mouth: Mucous membranes are moist.     Pharynx: Oropharynx is clear. No oropharyngeal exudate or posterior oropharyngeal erythema.  Cardiovascular:     Rate and Rhythm: Normal rate and regular rhythm.     Heart sounds: No murmur heard.    No friction rub. No gallop.  Pulmonary:     Effort: Pulmonary effort is normal. No respiratory distress.     Breath sounds: Normal breath sounds. No wheezing, rhonchi or rales.  Abdominal:     General: Bowel sounds are normal. There is no distension.     Palpations: Abdomen is soft. There is no mass.     Tenderness: There is no abdominal tenderness.  Musculoskeletal:        General: No swelling.     Right lower leg: No edema.     Left lower leg: No edema.  Lymphadenopathy:     Cervical: No cervical adenopathy.     Upper Body:     Right upper body: No supraclavicular or axillary adenopathy.     Left upper body: No  supraclavicular or axillary adenopathy.     Lower Body: No right inguinal adenopathy. No left inguinal adenopathy.  Skin:    General: Skin is warm.     Coloration: Skin is not jaundiced.     Findings: No lesion or rash.  Neurological:     General: No focal deficit present.     Mental Status: She is alert and oriented to person, place, and time. Mental status is at baseline.  Psychiatric:        Mood and Affect: Mood normal.        Behavior: Behavior normal.        Thought Content: Thought content normal.    LABS:      Latest Ref Rng & Units 12/24/2023    2:53 PM 10/24/2023   10:21 AM 09/11/2023    1:36 PM  CBC  WBC 4.0 - 10.5 K/uL 8.4  10.6  7.4   Hemoglobin 12.0 - 15.0 g/dL 85.6  85.6  84.8   Hematocrit 36.0 - 46.0 % 41.5  41.7  43.9   Platelets 150 - 400 K/uL 353  464  493       Latest Ref Rng & Units 10/24/2023   10:21 AM 09/11/2023    1:36 PM 02/02/2023    2:24 PM  CMP  Glucose 70 - 99 mg/dL 96  70  75   BUN 6 - 20 mg/dL 26  20  12    Creatinine 0.44 - 1.00 mg/dL 8.80  8.82  8.60   Sodium 135 - 145 mmol/L 138  136  137   Potassium 3.5 - 5.1 mmol/L 4.4  4.7  4.7   Chloride 98 - 111 mmol/L 103  99  103   CO2 22 - 32 mmol/L 22  19  20    Calcium  8.9 - 10.3 mg/dL 89.5  9.6  9.5   Total Protein 6.5 - 8.1 g/dL 7.9  7.1    Total Bilirubin 0.0 - 1.2 mg/dL <9.7  <9.7    Alkaline Phos 38 - 126 U/L 71  71    AST 15 - 41 U/L 17  16    ALT 0 - 44 U/L 6  9      Latest Reference Range & Units 10/24/23 10:21 12/24/23 14:53  Iron  28 - 170 ug/dL 58 60  UIBC ug/dL 606 759  TIBC 749 - 549 ug/dL 548 (H) 699  Saturation Ratios 10.4 - 31.8 % 13 20  Ferritin 11 - 307 ng/mL 10 (L) 276  (H): Data is abnormally high (L): Data is abnormally low  ASSESSMENT & PLAN:  A 58 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   I am very pleased as her platelets and iron  parameters have normalized since recently receiving her IV iron .  I remain pleased with her normal hemoglobin  of 14.3.  As she is now doing well from a hematologic standpoint, I will see this patient back in 6 months for repeat clinical assessment.  The patient understands all the plans discussed today and is in agreement with them.  Crystal Lyons Crystal Kerns, MD

## 2023-12-24 ENCOUNTER — Other Ambulatory Visit: Payer: Self-pay | Admitting: Oncology

## 2023-12-24 ENCOUNTER — Inpatient Hospital Stay: Attending: Oncology | Admitting: Oncology

## 2023-12-24 ENCOUNTER — Inpatient Hospital Stay

## 2023-12-24 ENCOUNTER — Telehealth: Payer: Self-pay | Admitting: Oncology

## 2023-12-24 VITALS — BP 114/75 | HR 75 | Temp 98.9°F | Resp 18 | Ht 61.0 in | Wt 104.1 lb

## 2023-12-24 DIAGNOSIS — D509 Iron deficiency anemia, unspecified: Secondary | ICD-10-CM

## 2023-12-24 DIAGNOSIS — R7989 Other specified abnormal findings of blood chemistry: Secondary | ICD-10-CM

## 2023-12-24 DIAGNOSIS — E611 Iron deficiency: Secondary | ICD-10-CM

## 2023-12-24 LAB — CBC WITH DIFFERENTIAL (CANCER CENTER ONLY)
Abs Immature Granulocytes: 0.02 K/uL (ref 0.00–0.07)
Basophils Absolute: 0.1 K/uL (ref 0.0–0.1)
Basophils Relative: 1 %
Eosinophils Absolute: 0.1 K/uL (ref 0.0–0.5)
Eosinophils Relative: 1 %
HCT: 41.5 % (ref 36.0–46.0)
Hemoglobin: 14.3 g/dL (ref 12.0–15.0)
Immature Granulocytes: 0 %
Lymphocytes Relative: 23 %
Lymphs Abs: 1.9 K/uL (ref 0.7–4.0)
MCH: 35 pg — ABNORMAL HIGH (ref 26.0–34.0)
MCHC: 34.5 g/dL (ref 30.0–36.0)
MCV: 101.5 fL — ABNORMAL HIGH (ref 80.0–100.0)
Monocytes Absolute: 0.4 K/uL (ref 0.1–1.0)
Monocytes Relative: 4 %
Neutro Abs: 5.9 K/uL (ref 1.7–7.7)
Neutrophils Relative %: 71 %
Platelet Count: 353 K/uL (ref 150–400)
RBC: 4.09 MIL/uL (ref 3.87–5.11)
RDW: 14.1 % (ref 11.5–15.5)
WBC Count: 8.4 K/uL (ref 4.0–10.5)
nRBC: 0 % (ref 0.0–0.2)

## 2023-12-24 LAB — IRON AND TIBC
Iron: 60 ug/dL (ref 28–170)
Saturation Ratios: 20 % (ref 10.4–31.8)
TIBC: 300 ug/dL (ref 250–450)
UIBC: 240 ug/dL

## 2023-12-24 LAB — FERRITIN: Ferritin: 276 ng/mL (ref 11–307)

## 2023-12-24 NOTE — Telephone Encounter (Signed)
 Patient has been scheduled for follow-up visit per 12/24/23 LOS.  Pt given an appt calendar with date and time.

## 2023-12-25 ENCOUNTER — Telehealth: Payer: Self-pay

## 2023-12-25 NOTE — Telephone Encounter (Signed)
 Latest Reference Range & Units 12/24/23 14:53  Iron  28 - 170 ug/dL 60  UIBC ug/dL 759  TIBC 749 - 549 ug/dL 699  Saturation Ratios 10.4 - 31.8 % 20  Ferritin 11 - 307 ng/mL 276

## 2024-01-01 DIAGNOSIS — M961 Postlaminectomy syndrome, not elsewhere classified: Secondary | ICD-10-CM | POA: Diagnosis not present

## 2024-01-01 DIAGNOSIS — G894 Chronic pain syndrome: Secondary | ICD-10-CM | POA: Diagnosis not present

## 2024-01-03 ENCOUNTER — Telehealth: Payer: Self-pay | Admitting: Cardiology

## 2024-01-03 DIAGNOSIS — E785 Hyperlipidemia, unspecified: Secondary | ICD-10-CM

## 2024-01-03 MED ORDER — REPATHA SURECLICK 140 MG/ML ~~LOC~~ SOAJ: 140.0000 mg | SUBCUTANEOUS | 0 refills | Status: DC

## 2024-01-03 NOTE — Telephone Encounter (Signed)
*  STAT* If patient is at the pharmacy, call can be transferred to refill team.   1. Which medications need to be refilled? (please list name of each medication and dose if known)   Evolocumab  (REPATHA  SURECLICK) 140 MG/ML SOAJ     4. Which pharmacy/location (including street and city if local pharmacy) is medication to be sent to?  [id number] - [place], [place] - [address]     5. Do they need a 30 day or 90 day supply? 90    Pt scheduled for 02/08/24

## 2024-01-13 ENCOUNTER — Encounter: Payer: Self-pay | Admitting: Oncology

## 2024-01-14 DIAGNOSIS — B3781 Candidal esophagitis: Secondary | ICD-10-CM | POA: Diagnosis not present

## 2024-01-14 DIAGNOSIS — Z681 Body mass index (BMI) 19 or less, adult: Secondary | ICD-10-CM | POA: Diagnosis not present

## 2024-01-14 DIAGNOSIS — S148XXA Injury of other specified nerves of neck, initial encounter: Secondary | ICD-10-CM | POA: Diagnosis not present

## 2024-01-14 DIAGNOSIS — H6992 Unspecified Eustachian tube disorder, left ear: Secondary | ICD-10-CM | POA: Diagnosis not present

## 2024-01-14 DIAGNOSIS — R52 Pain, unspecified: Secondary | ICD-10-CM | POA: Diagnosis not present

## 2024-01-14 DIAGNOSIS — B370 Candidal stomatitis: Secondary | ICD-10-CM | POA: Diagnosis not present

## 2024-01-17 ENCOUNTER — Other Ambulatory Visit: Payer: Self-pay | Admitting: Cardiology

## 2024-02-08 ENCOUNTER — Ambulatory Visit: Attending: Cardiology | Admitting: Cardiology

## 2024-02-08 ENCOUNTER — Encounter: Payer: Self-pay | Admitting: Cardiology

## 2024-02-08 VITALS — BP 100/68 | HR 92 | Ht 61.0 in | Wt 104.0 lb

## 2024-02-08 DIAGNOSIS — R0789 Other chest pain: Secondary | ICD-10-CM

## 2024-02-08 DIAGNOSIS — I739 Peripheral vascular disease, unspecified: Secondary | ICD-10-CM

## 2024-02-08 DIAGNOSIS — I10 Essential (primary) hypertension: Secondary | ICD-10-CM

## 2024-02-08 NOTE — Progress Notes (Signed)
 Cardiology Office Note:    Date:  02/08/2024   ID:  Crystal Lyons, DOB [birth year], MRN [id number]  PCP:  Street, Lonni HERO, MD  Cardiologist:  Lamar Fitch, MD    Referring MD: Crystal Lyons, *   Chief Complaint  Patient presents with   Follow-up    History of Present Illness:    Crystal Lyons is a 58 y.o. female  Past medical history significant for smoking which is still ongoing, peripheral vascular disease in form of carotic arterial stenosis up to 59% stenosis bilaterally, dyslipidemia now on PCSK9 agent, extensive evaluation has been performed on her coronary arteries which luckily was unrevealing she did have coronary stent in 2022.  Comes today to months for follow-up overall she is doing fair.  Complain of having back pain as well as leg pain and I did today ABI study which were normal.  Again sadly continues to smoke  Past Medical History:  Diagnosis Date   Anxiety    Anxiety disorder 08/19/2013   Atypical chest pain    Barrett esophagus    Brachial neuritis 11/23/2012   Cancer (HCC)    VULVAR   Carpal tunnel syndrome    right and left wrists   Cervical post-laminectomy syndrome 08/19/2013   Chronic headache disorder 05/08/2016   Chronic neck pain 03/15/2015   DDD (degenerative disc disease), lumbosacral 08/19/2013   Disorder of bone and cartilage 04/21/2013   Dyslipidemia 01/21/2015   Dysrhythmia    Encounter for therapeutic drug monitoring 05/01/2019   Epilepsy (HCC) 02/24/2014   Essential hypertension 11/20/2017   GERD (gastroesophageal reflux disease)    Headache 03/09/2014   Headache(784.0) 03/09/2014   Heart murmur    Hiatal hernia    Hypercholesterolemia 11/23/2012   Hyperlipidemia    Hypertension    Low back pain 11/23/2012   Lumbar radicular pain 04/21/2013   Myalgia and myositis 08/19/2013   Osteoarthritis of spine with radiculopathy, 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/15/2015   Osteopenia    Peripheral vascular disease, unspecified (HCC)  carotic   disease up to 59% 07/14/2022   Seizure disorder (HCC) 08/19/2013   Seizures (HCC) 09/27/2012   Shingles    Sinus tachycardia 08/16/2018   Sleep apnea    Sleep disorder 09/27/2012   Smoking 11/20/2017   Vitamin D deficiency 02/24/2014    Past Surgical History:  Procedure Laterality Date   ABDOMINAL HERNIA REPAIR Left    ADENOIDECTOMY     BRAIN SURGERY Right 1986   right temporal lobe   BREAST BIOPSY Right 12/04/2005   BREAST BIOPSY Left 07/21/2022   US  LT BREAST BX W LOC DEV 1ST LESION IMG BX SPEC US  GUIDE 07/21/2022 [facility]   CYST REMOVAL TRUNK     EYE SURGERY Left    cyst removed   GALLBLADDER SURGERY N/A 2009   INNER EAR SURGERY Bilateral    MYRINGOTOMY Bilateral    NECK SURGERY     C6-C7 FUSION   SHOULDER SURGERY Bilateral 2009, 2010   REPAIR   TOTAL ABDOMINAL HYSTERECTOMY     UPPER GI ENDOSCOPY  10/2020   VULVECTOMY N/A 07/04/2018   Procedure: WIDE EXCISION VULVECTOMY;  Surgeon: Okey Leader, MD;  Location: [facility];  Service: Gynecology;  Laterality: N/A;    Current Medications: Current Meds  Medication Sig   albuterol (PROVENTIL HFA;VENTOLIN HFA) 108 (90 Base) MCG/ACT inhaler Inhale 2 puffs into the lungs every 6 (six) hours as needed for wheezing or shortness of breath.   ALPRAZolam  (XANAX ) 0.5 MG  tablet Take 0.5 mg by mouth 3 (three) times daily as needed for anxiety.   clobetasol ointment (TEMOVATE) 0.05 % Apply 1 application topically 2 (two) times daily.   denosumab  (PROLIA ) 60 MG/ML SOSY injection Inject 60 mg into the skin every 6 (six) months.   dicyclomine (BENTYL) 10 MG capsule Take 10 mg by mouth every 6 (six) hours as needed for spasms. For abdominal pain   Evolocumab  (REPATHA  SURECLICK) 140 MG/ML SOAJ Inject 140 mg into the skin every 14 (fourteen) days.   famotidine (PEPCID) 20 MG tablet Take 40 mg by mouth daily.   Fexofenadine-Pseudoephedrine (ALLEGRA-D 12 HOUR PO) Take 1 tablet by mouth every other day.   fluconazole (DIFLUCAN)  150 MG tablet Take 150 mg by mouth every other day.   HYDROcodone-acetaminophen  (NORCO/VICODIN) 5-325 MG per tablet Take 1 tablet by mouth as needed for moderate pain or severe pain (Up tp 4 times a day).   lansoprazole (PREVACID) 30 MG capsule Take 30 mg by mouth daily.    linaclotide (LINZESS) 145 MCG CAPS capsule Take 145 mcg by mouth daily before breakfast.   mometasone (NASONEX) 50 MCG/ACT nasal spray Place 1 spray into the nose 2 (two) times daily.   naloxone (NARCAN) nasal spray 4 mg/0.1 mL Place 0.4 mg into the nose once.   neomycin-bacitracin-polymyxin 3.5-4074667072 OINT Apply 1 application. topically 3 (three) times daily. 3.5   nitroGLYCERIN  (NITROSTAT ) 0.4 MG SL tablet Place 1 tablet (0.4 mg total) under the tongue every 5 (five) minutes as needed for chest pain.   nystatin (MYCOSTATIN) 100000 UNIT/ML suspension Use as directed 5 mLs in the mouth or throat 4 (four) times daily.   omeprazole (PRILOSEC) 20 MG capsule Take 40 mg by mouth as needed (indigestion).   promethazine  (PHENERGAN ) 25 MG tablet Take 12.5 mg by mouth every 8 (eight) hours as needed for nausea or vomiting.   spironolactone -hydrochlorothiazide (ALDACTAZIDE) 25-25 MG tablet TAKE ONE TABLET BY MOUTH DAILY   terconazole (TERAZOL 3) 0.8 % vaginal cream Place 1 applicator vaginally at bedtime.   tiotropium (SPIRIVA) 18 MCG inhalation capsule Place 18 mcg into inhaler and inhale as needed (sob or wheezing).   tiZANidine (ZANAFLEX) 4 MG capsule Take 4 mg by mouth 3 (three) times daily.   VIVELLE-DOT 0.05 MG/24HR Place 1 patch onto the skin 2 (two) times a week.    VOQUEZNA 20 MG TABS Take 1 tablet by mouth daily.     Allergies:   Adenosine, Azithromycin, Alendronate, Aleve [naproxen sodium], Atorvastatin, Celecoxib, Clonazepam , Clozapine, Crestor  [rosuvastatin ], Erythromycin base, Feldene [piroxicam], Keflex [cephalexin], Levetiracetam, Mobic [meloxicam], Neurontin [gabapentin], Other, Penicillins, Simvastatin, Tape, Vioxx  [rofecoxib], and Zetia  [ezetimibe ]   Social History   Socioeconomic History   Marital status: Single    Spouse name: Not on file   Number of children: 0   Years of education: 8   Highest education level: Not on file  Occupational History   Occupation: Disabled  Tobacco Use   Smoking status: Every Day    Current packs/day: 1.50    Types: Cigarettes   Smokeless tobacco: Former    Types: Engineer, drilling   Vaping status: Former  Substance and Sexual Activity   Alcohol use: No   Drug use: No   Sexual activity: Not Currently  Other Topics Concern   Not on file  Social History Narrative   Patient lives at home with  mother. Patient has no children. Patient is single. Patient has a 8th grade education. Patient is disabled  Social Drivers of Corporate investment banker Strain: Not on file  Food Insecurity: No Food Insecurity (10/24/2023)   Hunger Vital Sign    Worried About Running Out of Food in the Last Year: Never true    Ran Out of Food in the Last Year: Never true  Transportation Needs: No Transportation Needs (10/24/2023)   PRAPARE - Administrator, Civil Service (Medical): No    Lack of Transportation (Non-Medical): No  Physical Activity: Not on file  Stress: Not on file  Social Connections: Not on file     Family History: The patient's family history includes Alcohol abuse in her half-sister; Diabetes in her brother and father; Heart failure in her father; Hypertension in her father and mother; Melanoma in her father; Other in her mother; Rheum arthritis in her mother; Stroke in her mother; Thyroid  disease in her mother. ROS:   Please see the history of present illness.    All 14 point review of systems negative except as described per history of present illness  EKGs/Labs/Other Studies Reviewed:         Recent Labs: 09/11/2023: TSH 1.210 10/24/2023: ALT 6; BUN 26; Creatinine 1.19; Potassium 4.4; Sodium 138 12/24/2023: Hemoglobin 14.3; Platelet Count 353   Recent Lipid Panel    Component Value Date/Time   CHOL 135 09/11/2023 1336   TRIG 137 09/11/2023 1336   HDL 44 09/11/2023 1336   CHOLHDL 3.1 09/11/2023 1336   LDLCALC 67 09/11/2023 1336    Physical Exam:    VS:  BP 100/68   Pulse 92   Ht 5' 1 (1.549 m)   Wt 104 lb (47.2 kg)   SpO2 99%   BMI 19.65 kg/m     Wt Readings from Last 3 Encounters:  02/08/24 104 lb (47.2 kg)  12/24/23 104 lb 1.6 oz (47.2 kg)  11/16/23 106 lb (48.1 kg)     GEN:  Well nourished, well developed in no acute distress HEENT: Normal NECK: No JVD; No carotid bruits LYMPHATICS: No lymphadenopathy CARDIAC: RRR, no murmurs, no rubs, no gallops RESPIRATORY:  Clear to auscultation without rales, wheezing or rhonchi  ABDOMEN: Soft, non-tender, non-distended MUSCULOSKELETAL:  No edema; No deformity  SKIN: Warm and dry LOWER EXTREMITIES: no swelling NEUROLOGIC:  Alert and oriented x 3 PSYCHIATRIC:  Normal affect   ASSESSMENT:    1. Essential hypertension   2. Peripheral vascular disease, unspecified (HCC) carotic   disease up to 59%   3. Atypical chest pain    PLAN:    In order of problems listed above:  Essential hypertension blood pressure seems to be well-controlled continue present management. Peripheral vascular disease in form of carotic arterial disease continue risk modifications. Atypical chest pain extensive valuation done negative she said she is doing a little better from that aspect. Smoking still ongoing we spent great of time talking about this I strongly recommended to quit   Medication Adjustments/Labs and Tests Ordered: Current medicines are reviewed at length with the patient today.  Concerns regarding medicines are outlined above.  No orders of the defined types were placed in this encounter.  Medication changes: No orders of the defined types were placed in this encounter.   Signed, Lamar DOROTHA Fitch, MD, [facility] 02/08/2024 1:32 PM    [facility]

## 2024-02-08 NOTE — Patient Instructions (Signed)

## 2024-02-11 DIAGNOSIS — K219 Gastro-esophageal reflux disease without esophagitis: Secondary | ICD-10-CM | POA: Diagnosis not present

## 2024-03-23 ENCOUNTER — Other Ambulatory Visit: Payer: Self-pay | Admitting: Cardiology

## 2024-03-23 DIAGNOSIS — E785 Hyperlipidemia, unspecified: Secondary | ICD-10-CM

## 2024-03-23 DIAGNOSIS — I739 Peripheral vascular disease, unspecified: Secondary | ICD-10-CM

## 2024-04-02 DIAGNOSIS — M961 Postlaminectomy syndrome, not elsewhere classified: Secondary | ICD-10-CM | POA: Diagnosis not present

## 2024-04-02 DIAGNOSIS — Z79899 Other long term (current) drug therapy: Secondary | ICD-10-CM | POA: Diagnosis not present

## 2024-04-02 DIAGNOSIS — G894 Chronic pain syndrome: Secondary | ICD-10-CM | POA: Diagnosis not present

## 2024-04-16 DIAGNOSIS — D508 Other iron deficiency anemias: Secondary | ICD-10-CM | POA: Diagnosis not present

## 2024-04-16 DIAGNOSIS — M7752 Other enthesopathy of left foot: Secondary | ICD-10-CM | POA: Diagnosis not present

## 2024-04-16 DIAGNOSIS — Z131 Encounter for screening for diabetes mellitus: Secondary | ICD-10-CM | POA: Diagnosis not present

## 2024-04-16 DIAGNOSIS — K2960 Other gastritis without bleeding: Secondary | ICD-10-CM | POA: Diagnosis not present

## 2024-04-16 DIAGNOSIS — N1831 Chronic kidney disease, stage 3a: Secondary | ICD-10-CM | POA: Diagnosis not present

## 2024-04-16 DIAGNOSIS — M818 Other osteoporosis without current pathological fracture: Secondary | ICD-10-CM | POA: Diagnosis not present

## 2024-04-16 DIAGNOSIS — E782 Mixed hyperlipidemia: Secondary | ICD-10-CM | POA: Diagnosis not present

## 2024-04-16 DIAGNOSIS — Z23 Encounter for immunization: Secondary | ICD-10-CM | POA: Diagnosis not present

## 2024-04-16 DIAGNOSIS — R0789 Other chest pain: Secondary | ICD-10-CM | POA: Diagnosis not present

## 2024-04-22 DIAGNOSIS — Z0100 Encounter for examination of eyes and vision without abnormal findings: Secondary | ICD-10-CM | POA: Diagnosis not present

## 2024-04-22 DIAGNOSIS — H524 Presbyopia: Secondary | ICD-10-CM | POA: Diagnosis not present

## 2024-04-22 DIAGNOSIS — R7303 Prediabetes: Secondary | ICD-10-CM | POA: Diagnosis not present

## 2024-05-14 DIAGNOSIS — M810 Age-related osteoporosis without current pathological fracture: Secondary | ICD-10-CM | POA: Diagnosis not present

## 2024-06-03 ENCOUNTER — Telehealth: Payer: Self-pay | Admitting: Pharmacy Technician

## 2024-06-03 NOTE — Telephone Encounter (Signed)
° °  Rep[facility]  [facility]

## 2024-06-25 NOTE — Progress Notes (Signed)
 " [facility] [address] [place],  [place]  [address] [phone]  Clinic Day:  06/26/2024  Referring physician: Rusty, Lonni HERO, MD   HISTORY OF PRESENT ILLNESS:  The patient is a 59 y.o. 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  She comes in today to reassess her labs after receiving IV iron  in May/June 2025.  Since her last visit, the patient has been doing fairly well.  She denies having increased fatigue or any overt forms of blood loss which concern her for progressive anemia.  For cough  PHYSICAL EXAM:  Blood pressure 102/71, pulse 74, temperature 98.2 F (36.8 C), temperature source Oral, resp. rate 14, height 5' 1 (1.549 m), weight 103 lb 6.4 oz (46.9 kg), SpO2 100%. Wt Readings from Last 3 Encounters:  06/26/24 103 lb 6.4 oz (46.9 kg)  02/08/24 104 lb (47.2 kg)  12/24/23 104 lb 1.6 oz (47.2 kg)   Body mass index is 19.54 kg/m. Performance status (ECOG): 1 - Symptomatic but completely ambulatory Physical Exam Constitutional:      Appearance: Normal appearance. She is not ill-appearing.  HENT:     Mouth/Throat:     Mouth: Mucous membranes are moist.     Pharynx: Oropharynx is clear. No oropharyngeal exudate or posterior oropharyngeal erythema.  Cardiovascular:     Rate and Rhythm: Normal rate and regular rhythm.     Heart sounds: No murmur heard.    No friction rub. No gallop.  Pulmonary:     Effort: Pulmonary effort is normal. No respiratory distress.     Breath sounds: Normal breath sounds. No wheezing, rhonchi or rales.  Abdominal:     General: Bowel sounds are normal. There is no distension.     Palpations: Abdomen is soft. There is no mass.     Tenderness: There is no abdominal tenderness.  Musculoskeletal:        General: No swelling.     Right lower leg: No edema.     Left lower leg: No edema.  Lymphadenopathy:     Cervical: No cervical  adenopathy.     Upper Body:     Right upper body: No supraclavicular or axillary adenopathy.     Left upper body: No supraclavicular or axillary adenopathy.     Lower Body: No right inguinal adenopathy. No left inguinal adenopathy.  Skin:    General: Skin is warm.     Coloration: Skin is not jaundiced.     Findings: No lesion or rash.  Neurological:     General: No focal deficit present.     Mental Status: She is alert and oriented to person, place, and time. Mental status is at baseline.  Psychiatric:        Mood and Affect: Mood normal.        Behavior: Behavior normal.        Thought Content: Thought content normal.    LABS:      Latest Ref Rng & Units 06/26/2024    2:07 PM 12/24/2023    2:53 PM 10/24/2023   10:21 AM  CBC  WBC 4.0 - 10.5 K/uL 8.4  8.4  10.6   Hemoglobin 12.0 - 15.0 g/dL 85.1  85.6  85.6   Hematocrit 36.0 - 46.0 % 43.3  41.5  41.7   Platelets 150 - 400 K/uL 341  353  464     Latest Reference Range & Units 10/24/23  10:21 12/24/23 14:53 06/26/24 14:07  MCV 80.0 - 100.0 fL 99.0 101.5 (H) 103.8 (H)  (H): Data is abnormally high    Latest Ref Rng & Units 06/26/2024    2:07 PM 10/24/2023   10:21 AM 09/11/2023    1:36 PM  CMP  Glucose 70 - 99 mg/dL 86  96  70   BUN 6 - 20 mg/dL 22  26  20    Creatinine 0.44 - 1.00 mg/dL 8.74  8.80  8.82   Sodium 135 - 145 mmol/L 139  138  136   Potassium 3.5 - 5.1 mmol/L 4.2  4.4  4.7   Chloride 98 - 111 mmol/L 102  103  99   CO2 22 - 32 mmol/L 24  22  19    Calcium  8.9 - 10.3 mg/dL 89.4  89.5  9.6   Total Protein 6.5 - 8.1 g/dL 7.7  7.9  7.1   Total Bilirubin 0.0 - 1.2 mg/dL 0.3  <9.7  <9.7   Alkaline Phos 38 - 126 U/L 69  71  71   AST 15 - 41 U/L 19  17  16    ALT 0 - 44 U/L 8  6  9      Latest Reference Range & Units 12/24/23 14:53 06/26/24 14:07  Iron  28 - 170 ug/dL 60 17 (L)  UIBC ug/dL 759 699  TIBC 749 - 549 ug/dL 699 683  Saturation Ratios 10.4 - 31.8 % 20 5 (L)  Ferritin 11 - 307 ng/mL 276 316 (H)  (L): Data is  abnormally low (H): Data is abnormally high  ASSESSMENT & PLAN:  A 59 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  When evaluating her labs today, I am pleased with her hemoglobin.  Furthermore, her platelet count remains stable.  Despite this, her iron  parameters are much different than what they has been in the past.  Her mildly elevated ferritin in the setting of other low iron  parameters suggests she has anemia of chronic disease, not iron  deficiency anemia.  Of note, iron  studies back in May 2025 clearly showed a low ferritin level and an elevated TIBC, which is what is typically seen in patients who are iron  deficient.  Although her current iron  pattern is not consistent with iron  deficiency anemia, I will check a soluble transferrin receptor which can help elucidate whether iron  deficiency is present.  Also, this patient's MCV has progressively risen over time.  Based upon this, I will check her B12 and folate levels, as well as a serum protein electrophoresis, to ensure either a nutritional deficiency or plasma cell dyscrasia is not behind her macrocytic red cell indices.  I will go over these results via a telephone appointment with her in 2 weeks.  The patient understands all the plans discussed today and is in agreement with them.  Jedd Schulenburg DELENA Kerns, MD       "

## 2024-06-26 ENCOUNTER — Inpatient Hospital Stay: Attending: Oncology | Admitting: Oncology

## 2024-06-26 ENCOUNTER — Other Ambulatory Visit: Payer: Self-pay | Admitting: Oncology

## 2024-06-26 ENCOUNTER — Inpatient Hospital Stay

## 2024-06-26 VITALS — BP 102/71 | HR 74 | Temp 98.2°F | Resp 14 | Ht 61.0 in | Wt 103.4 lb

## 2024-06-26 DIAGNOSIS — D75839 Thrombocytosis, unspecified: Secondary | ICD-10-CM | POA: Diagnosis not present

## 2024-06-26 DIAGNOSIS — D509 Iron deficiency anemia, unspecified: Secondary | ICD-10-CM | POA: Diagnosis present

## 2024-06-26 DIAGNOSIS — D7589 Other specified diseases of blood and blood-forming organs: Secondary | ICD-10-CM | POA: Diagnosis not present

## 2024-06-26 LAB — IRON AND TIBC
Iron: 17 ug/dL — ABNORMAL LOW (ref 28–170)
Saturation Ratios: 5 % — ABNORMAL LOW (ref 10.4–31.8)
TIBC: 316 ug/dL (ref 250–450)
UIBC: 300 ug/dL

## 2024-06-26 LAB — CBC WITH DIFFERENTIAL (CANCER CENTER ONLY)
Abs Immature Granulocytes: 0.01 K/uL (ref 0.00–0.07)
Basophils Absolute: 0 K/uL (ref 0.0–0.1)
Basophils Relative: 0 %
Eosinophils Absolute: 0.1 K/uL (ref 0.0–0.5)
Eosinophils Relative: 2 %
HCT: 43.3 % (ref 36.0–46.0)
Hemoglobin: 14.8 g/dL (ref 12.0–15.0)
Immature Granulocytes: 0 %
Lymphocytes Relative: 17 %
Lymphs Abs: 1.4 K/uL (ref 0.7–4.0)
MCH: 35.5 pg — ABNORMAL HIGH (ref 26.0–34.0)
MCHC: 34.2 g/dL (ref 30.0–36.0)
MCV: 103.8 fL — ABNORMAL HIGH (ref 80.0–100.0)
Monocytes Absolute: 0.5 K/uL (ref 0.1–1.0)
Monocytes Relative: 6 %
Neutro Abs: 6.3 K/uL (ref 1.7–7.7)
Neutrophils Relative %: 75 %
Platelet Count: 341 K/uL (ref 150–400)
RBC: 4.17 MIL/uL (ref 3.87–5.11)
RDW: 12.3 % (ref 11.5–15.5)
WBC Count: 8.4 K/uL (ref 4.0–10.5)
nRBC: 0 % (ref 0.0–0.2)

## 2024-06-26 LAB — CMP (CANCER CENTER ONLY)
ALT: 8 U/L (ref 0–44)
AST: 19 U/L (ref 15–41)
Albumin: 4.9 g/dL (ref 3.5–5.0)
Alkaline Phosphatase: 69 U/L (ref 38–126)
Anion gap: 13 (ref 5–15)
BUN: 22 mg/dL — ABNORMAL HIGH (ref 6–20)
CO2: 24 mmol/L (ref 22–32)
Calcium: 10.5 mg/dL — ABNORMAL HIGH (ref 8.9–10.3)
Chloride: 102 mmol/L (ref 98–111)
Creatinine: 1.25 mg/dL — ABNORMAL HIGH (ref 0.44–1.00)
GFR, Estimated: 50 mL/min — ABNORMAL LOW
Glucose, Bld: 86 mg/dL (ref 70–99)
Potassium: 4.2 mmol/L (ref 3.5–5.1)
Sodium: 139 mmol/L (ref 135–145)
Total Bilirubin: 0.3 mg/dL (ref 0.0–1.2)
Total Protein: 7.7 g/dL (ref 6.5–8.1)

## 2024-06-26 LAB — FERRITIN: Ferritin: 316 ng/mL — ABNORMAL HIGH (ref 11–307)

## 2024-06-27 ENCOUNTER — Telehealth: Payer: Self-pay

## 2024-06-27 NOTE — Telephone Encounter (Signed)
 Per Dr. Ezzard iron  results are inconclusive need to order soluble transferrin which requires additional blood for testing.  Was not able to reach patient to schedule additional labs.

## 2024-07-01 ENCOUNTER — Other Ambulatory Visit: Payer: Self-pay | Admitting: Oncology

## 2024-07-01 ENCOUNTER — Telehealth: Payer: Self-pay

## 2024-07-01 DIAGNOSIS — E611 Iron deficiency: Secondary | ICD-10-CM

## 2024-07-01 NOTE — Telephone Encounter (Signed)
 Patient called regarding lab test Dr Ezzard reviewed in depth and patient to sch lab appt followed by telephone appt one week later per Dr Ezzard lab orders in.

## 2024-07-02 ENCOUNTER — Encounter: Payer: Self-pay | Admitting: Oncology

## 2024-07-03 ENCOUNTER — Inpatient Hospital Stay

## 2024-07-03 ENCOUNTER — Other Ambulatory Visit: Payer: Self-pay | Admitting: Cardiology

## 2024-07-03 DIAGNOSIS — E611 Iron deficiency: Secondary | ICD-10-CM

## 2024-07-03 DIAGNOSIS — I739 Peripheral vascular disease, unspecified: Secondary | ICD-10-CM

## 2024-07-03 DIAGNOSIS — D509 Iron deficiency anemia, unspecified: Secondary | ICD-10-CM | POA: Diagnosis not present

## 2024-07-03 DIAGNOSIS — E785 Hyperlipidemia, unspecified: Secondary | ICD-10-CM

## 2024-07-03 LAB — VITAMIN B12: Vitamin B-12: 406 pg/mL (ref 180–914)

## 2024-07-03 LAB — FOLATE: Folate: 8.5 ng/mL

## 2024-07-04 ENCOUNTER — Other Ambulatory Visit: Payer: Self-pay | Admitting: Cardiology

## 2024-07-04 DIAGNOSIS — I739 Peripheral vascular disease, unspecified: Secondary | ICD-10-CM

## 2024-07-04 DIAGNOSIS — E785 Hyperlipidemia, unspecified: Secondary | ICD-10-CM

## 2024-07-04 LAB — SOLUBLE TRANSFERRIN RECEPTOR: Transferrin Receptor: 18.8 nmol/L (ref 12.2–27.3)

## 2024-07-04 NOTE — Telephone Encounter (Signed)
 Creatine outside of Normal Range  In accordance with refill protocols, please review and address the following requirements before this medication refill can be authorized:  Labs

## 2024-07-06 LAB — METHYLMALONIC ACID, SERUM: Methylmalonic Acid, Quantitative: 133 nmol/L (ref 0–378)

## 2024-07-07 LAB — PROTEIN ELECTROPHORESIS, SERUM
A/G Ratio: 1.7 (ref 0.7–1.7)
Albumin ELP: 4.4 g/dL (ref 2.9–4.4)
Alpha-1-Globulin: 0.2 g/dL (ref 0.0–0.4)
Alpha-2-Globulin: 0.9 g/dL (ref 0.4–1.0)
Beta Globulin: 1 g/dL (ref 0.7–1.3)
Gamma Globulin: 0.6 g/dL (ref 0.4–1.8)
Globulin, Total: 2.6 g/dL (ref 2.2–3.9)
Total Protein ELP: 7 g/dL (ref 6.0–8.5)

## 2024-07-09 ENCOUNTER — Encounter: Payer: Self-pay | Admitting: Oncology

## 2024-07-09 DIAGNOSIS — D7589 Other specified diseases of blood and blood-forming organs: Secondary | ICD-10-CM | POA: Insufficient documentation

## 2024-07-09 NOTE — Progress Notes (Deleted)
 " [facility]  [address] [place],  [place]  [address] [phone]  Clinic Day:  12/24/2023  Referring physician: Rusty, Lonni HERO, MD   HISTORY OF PRESENT ILLNESS:  The patient is a 59 y.o. 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  She comes in today to reassess her labs after receiving IV iron  in May/June 2025.  She claims to feel better since she received her IV iron .     PHYSICAL EXAM:  There were no vitals taken for this visit. Wt Readings from Last 3 Encounters:  06/26/24 103 lb 6.4 oz (46.9 kg)  02/08/24 104 lb (47.2 kg)  12/24/23 104 lb 1.6 oz (47.2 kg)   There is no height or weight on file to calculate BMI. Performance status (ECOG): 1 - Symptomatic but completely ambulatory Physical Exam Constitutional:      Appearance: Normal appearance. She is not ill-appearing.  HENT:     Mouth/Throat:     Mouth: Mucous membranes are moist.     Pharynx: Oropharynx is clear. No oropharyngeal exudate or posterior oropharyngeal erythema.  Cardiovascular:     Rate and Rhythm: Normal rate and regular rhythm.     Heart sounds: No murmur heard.    No friction rub. No gallop.  Pulmonary:     Effort: Pulmonary effort is normal. No respiratory distress.     Breath sounds: Normal breath sounds. No wheezing, rhonchi or rales.  Abdominal:     General: Bowel sounds are normal. There is no distension.     Palpations: Abdomen is soft. There is no mass.     Tenderness: There is no abdominal tenderness.  Musculoskeletal:        General: No swelling.     Right lower leg: No edema.     Left lower leg: No edema.  Lymphadenopathy:     Cervical: No cervical adenopathy.     Upper Body:     Right upper body: No supraclavicular or axillary adenopathy.     Left upper body: No supraclavicular or axillary adenopathy.     Lower Body: No right inguinal adenopathy. No left inguinal adenopathy.   Skin:    General: Skin is warm.     Coloration: Skin is not jaundiced.     Findings: No lesion or rash.  Neurological:     General: No focal deficit present.     Mental Status: She is alert and oriented to person, place, and time. Mental status is at baseline.  Psychiatric:        Mood and Affect: Mood normal.        Behavior: Behavior normal.        Thought Content: Thought content normal.    LABS:      Latest Ref Rng & Units 06/26/2024    2:07 PM 12/24/2023    2:53 PM 10/24/2023   10:21 AM  CBC  WBC 4.0 - 10.5 K/uL 8.4  8.4  10.6   Hemoglobin 12.0 - 15.0 g/dL 85.1  85.6  85.6   Hematocrit 36.0 - 46.0 % 43.3  41.5  41.7   Platelets 150 - 400 K/uL 341  353  464       Latest Ref Rng & Units 06/26/2024    2:07 PM 10/24/2023   10:21 AM 09/11/2023    1:36 PM  CMP  Glucose 70 - 99 mg/dL 86  96  70   BUN 6 -  20 mg/dL 22  26  20    Creatinine 0.44 - 1.00 mg/dL 8.74  8.80  8.82   Sodium 135 - 145 mmol/L 139  138  136   Potassium 3.5 - 5.1 mmol/L 4.2  4.4  4.7   Chloride 98 - 111 mmol/L 102  103  99   CO2 22 - 32 mmol/L 24  22  19    Calcium  8.9 - 10.3 mg/dL 89.4  89.5  9.6   Total Protein 6.5 - 8.1 g/dL 7.7  7.9  7.1   Total Bilirubin 0.0 - 1.2 mg/dL 0.3  <9.7  <9.7   Alkaline Phos 38 - 126 U/L 69  71  71   AST 15 - 41 U/L 19  17  16    ALT 0 - 44 U/L 8  6  9      Latest Reference Range & Units 06/26/24 14:07 07/03/24 13:52 07/03/24 13:53  Iron  28 - 170 ug/dL 17 (L)    UIBC ug/dL 699    TIBC 749 - 549 ug/dL 683    Saturation Ratios 10.4 - 31.8 % 5 (L)    Ferritin 11 - 307 ng/mL 316 (H)    Folate >5.9 ng/mL   8.5  Transferrin Receptor 12.2 - 27.3 nmol/L  18.8   Methylmalonic Acid, Quantitative 0 - 378 nmol/L  133   Vitamin B12 180 - 914 pg/mL  406   (L): Data is abnormally low (H): Data is abnormally high  Latest Reference Range & Units 07/03/24 13:52  Total Protein ELP 6.0 - 8.5 g/dL 7.0  Albumin ELP 2.9 - 4.4 g/dL 4.4  Globulin, Total 2.2 - 3.9 g/dL 2.6 (C)  A/G Ratio 0.7 -  1.7  1.7 (C)  Alpha-1-Globulin 0.0 - 0.4 g/dL 0.2  Joeyj-7-Honalopw 0.4 - 1.0 g/dL 0.9  Beta Globulin 0.7 - 1.3 g/dL 1.0  Gamma Globulin 0.4 - 1.8 g/dL 0.6  M-SPIKE, % Not Observed g/dL Not Observed  (C): Corrected ASSESSMENT & PLAN:  A 59 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   I am very pleased as her platelets and iron  parameters have normalized since recently receiving her IV iron .  I remain pleased with her normal hemoglobin of 14.3.  As she is now doing well from a hematologic standpoint, I will see this patient back in 6 months for repeat clinical assessment.  The patient understands all the plans discussed today and is in agreement with them.  Alando Colleran DELENA Kerns, MD       "

## 2024-07-10 ENCOUNTER — Inpatient Hospital Stay (HOSPITAL_BASED_OUTPATIENT_CLINIC_OR_DEPARTMENT_OTHER): Admitting: Oncology

## 2024-07-10 ENCOUNTER — Other Ambulatory Visit: Payer: Self-pay | Admitting: Oncology

## 2024-07-10 DIAGNOSIS — D509 Iron deficiency anemia, unspecified: Secondary | ICD-10-CM | POA: Diagnosis not present

## 2024-07-10 DIAGNOSIS — E611 Iron deficiency: Secondary | ICD-10-CM

## 2024-07-14 ENCOUNTER — Encounter: Payer: Self-pay | Admitting: Oncology

## 2024-07-14 NOTE — Progress Notes (Signed)
 " [facility] at [facility] [address] [place],  [place]  [address] [phone]  Clinic Day:  06/26/2024  Referring physician: Street, Lonni HERO, MD  TELEPHONE VISIT  HISTORY OF PRESENT ILLNESS:  The patient is a 59 y.o. female with a history of iron  deficiency anemia and secondary thrombocythemia.  She last received IV iron  in June 2025, which was effective in replenishing her iron  stores.  Of note, recent labs showed her to have macrocytic red cell indices.  Based upon this, recent labs were done elucidate the potential etiology behind her macrocytic red cell indices.  Furthermore, her recent iron  parameters came back much differently than what they have been in the past, which included a low serum iron  and elevated ferritin.  Although that pattern usually represents anemia of chronic disease, her history of iron  deficiency anemia led to her getting her soluble transferrin receptor checked.  The telephone appointment today is to go over all of these labs and their implications.  She continues to deny having increased weakness or any overt forms of blood loss  PHYSICAL EXAM: DEFERRED   LABS:      Latest Ref Rng & Units 06/26/2024    2:07 PM 12/24/2023    2:53 PM 10/24/2023   10:21 AM  CBC  WBC 4.0 - 10.5 K/uL 8.4  8.4  10.6   Hemoglobin 12.0 - 15.0 g/dL 85.1  85.6  85.6   Hematocrit 36.0 - 46.0 % 43.3  41.5  41.7   Platelets 150 - 400 K/uL 341  353  464     Latest Reference Range & Units 10/24/23 10:21 12/24/23 14:53 06/26/24 14:07  MCV 80.0 - 100.0 fL 99.0 101.5 (H) 103.8 (H)  (H): Data is abnormally high    Latest Ref Rng & Units 06/26/2024    2:07 PM 10/24/2023   10:21 AM 09/11/2023    1:36 PM  CMP  Glucose 70 - 99 mg/dL 86  96  70   BUN 6 - 20 mg/dL 22  26  20    Creatinine 0.44 - 1.00 mg/dL 8.74  8.80  8.82   Sodium 135 - 145 mmol/L 139  138  136   Potassium 3.5 - 5.1 mmol/L 4.2  4.4  4.7   Chloride 98 - 111 mmol/L 102  103  99   CO2 22 - 32 mmol/L 24  22   19    Calcium  8.9 - 10.3 mg/dL 89.4  89.5  9.6   Total Protein 6.5 - 8.1 g/dL 7.7  7.9  7.1   Total Bilirubin 0.0 - 1.2 mg/dL 0.3  <9.7  <9.7   Alkaline Phos 38 - 126 U/L 69  71  71   AST 15 - 41 U/L 19  17  16    ALT 0 - 44 U/L 8  6  9      Latest Reference Range & Units 12/24/23 14:53 06/26/24 14:07  Iron  28 - 170 ug/dL 60 17 (L)  UIBC ug/dL 759 699  TIBC 749 - 549 ug/dL 699 683  Saturation Ratios 10.4 - 31.8 % 20 5 (L)  Ferritin 11 - 307 ng/mL 276 316 (H)  (L): Data is abnormally low (H): Data is abnormally high  Latest Reference Range & Units 07/03/24 13:52 07/03/24 13:53  Folate >5.9 ng/mL  8.5  Transferrin Receptor 12.2 - 27.3 nmol/L 18.8   Methylmalonic Acid, Quantitative 0 - 378 nmol/L 133   Vitamin B12 180 - 914 pg/mL 406  Latest Reference Range & Units 07/03/24 13:52  Total Protein ELP 6.0 - 8.5 g/dL 7.0  Albumin ELP 2.9 - 4.4 g/dL 4.4  Globulin, Total 2.2 - 3.9 g/dL 2.6 (C)  A/G Ratio 0.7 - 1.7  1.7 (C)  Alpha-1-Globulin 0.0 - 0.4 g/dL 0.2  Joeyj-7-Honalopw 0.4 - 1.0 g/dL 0.9  Beta Globulin 0.7 - 1.3 g/dL 1.0  Gamma Globulin 0.4 - 1.8 g/dL 0.6  M-SPIKE, % Not Observed g/dL Not Observed  (C): Corrected ASSESSMENT & PLAN:  A 59 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  However, her most recent CBC revealed macrocytic red cell indices.  I went over all of her recent labs with her, which showed that she does not have either vitamin B12 or folate deficiency.  A serum protein electrophoresis also did not reveal any evidence of a plasma cell dyscrasia being present.  As it pertains to her history of iron  deficiency anemia, her soluble transferrin receptor level came back normal, making it very unlikely that she has persistent iron  deficiency.  For now, her macrocytic red cell indices will be followed conservatively.  Overall, I am pleased as her hemoglobin has progressively improved over time to where it is now ideal at 14.8.  As she is doing well,  I will see her back in 6 months for repeat clinical assessment.  The patient understands all the plans discussed today and is in agreement with them.  Levin Dagostino DELENA Kerns, MD       "

## 2024-07-15 ENCOUNTER — Other Ambulatory Visit: Payer: Self-pay | Admitting: Obstetrics and Gynecology

## 2024-07-15 DIAGNOSIS — Z1231 Encounter for screening mammogram for malignant neoplasm of breast: Secondary | ICD-10-CM

## 2024-08-18 ENCOUNTER — Ambulatory Visit: Admitting: Cardiology

## 2024-12-25 ENCOUNTER — Inpatient Hospital Stay

## 2024-12-25 ENCOUNTER — Inpatient Hospital Stay: Admitting: Oncology

## 2025-01-06 ENCOUNTER — Inpatient Hospital Stay

## 2025-01-08 ENCOUNTER — Inpatient Hospital Stay: Admitting: Oncology
